# Patient Record
Sex: Female | Born: 1960
Health system: Southern US, Community
[De-identification: ages and names within clinical notes are randomized; demographics above are authoritative.]

## PROBLEM LIST (undated history)

## (undated) DIAGNOSIS — F419 Anxiety disorder, unspecified: Secondary | ICD-10-CM

## (undated) DIAGNOSIS — G4733 Obstructive sleep apnea (adult) (pediatric): Secondary | ICD-10-CM

## (undated) DIAGNOSIS — Z87442 Personal history of urinary calculi: Secondary | ICD-10-CM

## (undated) DIAGNOSIS — R7303 Prediabetes: Secondary | ICD-10-CM

## (undated) DIAGNOSIS — K6289 Other specified diseases of anus and rectum: Secondary | ICD-10-CM

## (undated) DIAGNOSIS — J302 Other seasonal allergic rhinitis: Secondary | ICD-10-CM

## (undated) DIAGNOSIS — G4730 Sleep apnea, unspecified: Secondary | ICD-10-CM

## (undated) DIAGNOSIS — F32A Depression, unspecified: Secondary | ICD-10-CM

## (undated) DIAGNOSIS — T7840XA Allergy, unspecified, initial encounter: Secondary | ICD-10-CM

## (undated) DIAGNOSIS — E7800 Pure hypercholesterolemia, unspecified: Secondary | ICD-10-CM

## (undated) DIAGNOSIS — M1990 Unspecified osteoarthritis, unspecified site: Secondary | ICD-10-CM

## (undated) DIAGNOSIS — I10 Essential (primary) hypertension: Secondary | ICD-10-CM

## (undated) DIAGNOSIS — R031 Nonspecific low blood-pressure reading: Secondary | ICD-10-CM

## (undated) DIAGNOSIS — R112 Nausea with vomiting, unspecified: Secondary | ICD-10-CM

## (undated) DIAGNOSIS — Z9889 Other specified postprocedural states: Secondary | ICD-10-CM

## (undated) DIAGNOSIS — R7611 Nonspecific reaction to tuberculin skin test without active tuberculosis: Secondary | ICD-10-CM

## (undated) DIAGNOSIS — Z86010 Personal history of colonic polyps: Secondary | ICD-10-CM

## (undated) DIAGNOSIS — K5790 Diverticulosis of intestine, part unspecified, without perforation or abscess without bleeding: Secondary | ICD-10-CM

## (undated) DIAGNOSIS — L900 Lichen sclerosus et atrophicus: Secondary | ICD-10-CM

## (undated) DIAGNOSIS — J45909 Unspecified asthma, uncomplicated: Secondary | ICD-10-CM

## (undated) DIAGNOSIS — F329 Major depressive disorder, single episode, unspecified: Secondary | ICD-10-CM

## (undated) DIAGNOSIS — Z9989 Dependence on other enabling machines and devices: Secondary | ICD-10-CM

## (undated) HISTORY — DX: Unspecified osteoarthritis, unspecified site: M19.90

## (undated) HISTORY — DX: Essential (primary) hypertension: I10

## (undated) HISTORY — PX: JOINT REPLACEMENT: SHX530

## (undated) HISTORY — PX: TUBAL LIGATION: SHX77

## (undated) HISTORY — DX: Pure hypercholesterolemia, unspecified: E78.00

## (undated) HISTORY — DX: Unspecified asthma, uncomplicated: J45.909

## (undated) HISTORY — DX: Personal history of colonic polyps: Z86.010

## (undated) HISTORY — PX: ENDOMETRIAL ABLATION: SHX621

## (undated) HISTORY — PX: GANGLION CYST EXCISION: SHX1691

## (undated) HISTORY — DX: Allergy, unspecified, initial encounter: T78.40XA

## (undated) HISTORY — DX: Depression, unspecified: F32.A

## (undated) HISTORY — DX: Sleep apnea, unspecified: G47.30

## (undated) HISTORY — PX: HEMORROIDECTOMY: SUR656

## (undated) HISTORY — PX: OTHER SURGICAL HISTORY: SHX169

## (undated) HISTORY — DX: Major depressive disorder, single episode, unspecified: F32.9

## (undated) HISTORY — DX: Nonspecific reaction to tuberculin skin test without active tuberculosis: R76.11

---

## 1999-10-06 ENCOUNTER — Ambulatory Visit (HOSPITAL_BASED_OUTPATIENT_CLINIC_OR_DEPARTMENT_OTHER): Admission: RE | Admit: 1999-10-06 | Discharge: 1999-10-06 | Payer: Self-pay | Admitting: Orthopedic Surgery

## 2001-06-23 ENCOUNTER — Other Ambulatory Visit: Admission: RE | Admit: 2001-06-23 | Discharge: 2001-06-23 | Payer: Self-pay | Admitting: Obstetrics and Gynecology

## 2002-08-04 ENCOUNTER — Other Ambulatory Visit: Admission: RE | Admit: 2002-08-04 | Discharge: 2002-08-04 | Payer: Self-pay | Admitting: Obstetrics and Gynecology

## 2003-09-09 ENCOUNTER — Other Ambulatory Visit: Admission: RE | Admit: 2003-09-09 | Discharge: 2003-09-09 | Payer: Self-pay | Admitting: Obstetrics and Gynecology

## 2003-11-18 ENCOUNTER — Ambulatory Visit (HOSPITAL_COMMUNITY): Admission: RE | Admit: 2003-11-18 | Discharge: 2003-11-18 | Payer: Self-pay | Admitting: Obstetrics and Gynecology

## 2004-10-11 ENCOUNTER — Other Ambulatory Visit: Admission: RE | Admit: 2004-10-11 | Discharge: 2004-10-11 | Payer: Self-pay | Admitting: Obstetrics and Gynecology

## 2004-12-15 ENCOUNTER — Ambulatory Visit: Payer: Self-pay | Admitting: Family Medicine

## 2005-09-06 ENCOUNTER — Ambulatory Visit: Payer: Self-pay | Admitting: Family Medicine

## 2005-09-26 ENCOUNTER — Emergency Department (HOSPITAL_COMMUNITY): Admission: EM | Admit: 2005-09-26 | Discharge: 2005-09-27 | Payer: Self-pay | Admitting: Emergency Medicine

## 2005-11-12 ENCOUNTER — Other Ambulatory Visit: Admission: RE | Admit: 2005-11-12 | Discharge: 2005-11-12 | Payer: Self-pay | Admitting: Obstetrics and Gynecology

## 2006-10-01 ENCOUNTER — Ambulatory Visit: Payer: Self-pay | Admitting: Internal Medicine

## 2006-11-01 ENCOUNTER — Ambulatory Visit: Payer: Self-pay | Admitting: Family Medicine

## 2006-11-01 LAB — CONVERTED CEMR LAB
ALT: 28 units/L (ref 0–40)
Alkaline Phosphatase: 69 units/L (ref 39–117)
BUN: 7 mg/dL (ref 6–23)
Calcium: 9 mg/dL (ref 8.4–10.5)
Chloride: 105 meq/L (ref 96–112)
GFR calc non Af Amer: 97 mL/min
Potassium: 4.2 meq/L (ref 3.5–5.1)
Total Protein: 7 g/dL (ref 6.0–8.3)

## 2006-11-21 ENCOUNTER — Ambulatory Visit: Payer: Self-pay | Admitting: Family Medicine

## 2007-02-21 ENCOUNTER — Encounter (INDEPENDENT_AMBULATORY_CARE_PROVIDER_SITE_OTHER): Payer: Self-pay | Admitting: Specialist

## 2007-02-21 ENCOUNTER — Ambulatory Visit (HOSPITAL_COMMUNITY): Admission: RE | Admit: 2007-02-21 | Discharge: 2007-02-21 | Payer: Self-pay | Admitting: Obstetrics and Gynecology

## 2007-02-24 ENCOUNTER — Inpatient Hospital Stay (HOSPITAL_COMMUNITY): Admission: AD | Admit: 2007-02-24 | Discharge: 2007-02-25 | Payer: Self-pay | Admitting: Obstetrics and Gynecology

## 2007-05-28 ENCOUNTER — Telehealth (INDEPENDENT_AMBULATORY_CARE_PROVIDER_SITE_OTHER): Payer: Self-pay | Admitting: *Deleted

## 2007-05-29 DIAGNOSIS — F3289 Other specified depressive episodes: Secondary | ICD-10-CM | POA: Insufficient documentation

## 2007-05-29 DIAGNOSIS — F329 Major depressive disorder, single episode, unspecified: Secondary | ICD-10-CM | POA: Insufficient documentation

## 2007-07-10 ENCOUNTER — Ambulatory Visit: Payer: Self-pay | Admitting: Family Medicine

## 2007-07-10 DIAGNOSIS — E785 Hyperlipidemia, unspecified: Secondary | ICD-10-CM | POA: Insufficient documentation

## 2007-07-17 ENCOUNTER — Telehealth (INDEPENDENT_AMBULATORY_CARE_PROVIDER_SITE_OTHER): Payer: Self-pay | Admitting: *Deleted

## 2007-07-31 ENCOUNTER — Ambulatory Visit: Payer: Self-pay | Admitting: Family Medicine

## 2007-08-04 LAB — CONVERTED CEMR LAB
CO2: 30 meq/L (ref 19–32)
Calcium: 8.9 mg/dL (ref 8.4–10.5)
Chloride: 107 meq/L (ref 96–112)
Cholesterol: 151 mg/dL (ref 0–200)
Creatinine, Ser: 0.7 mg/dL (ref 0.4–1.2)
Glucose, Bld: 108 mg/dL — ABNORMAL HIGH (ref 70–99)
HDL: 36.9 mg/dL — ABNORMAL LOW (ref >39.0)

## 2007-10-24 ENCOUNTER — Telehealth (INDEPENDENT_AMBULATORY_CARE_PROVIDER_SITE_OTHER): Payer: Self-pay | Admitting: *Deleted

## 2007-12-16 ENCOUNTER — Telehealth (INDEPENDENT_AMBULATORY_CARE_PROVIDER_SITE_OTHER): Payer: Self-pay | Admitting: *Deleted

## 2008-02-06 ENCOUNTER — Ambulatory Visit: Payer: Self-pay | Admitting: Internal Medicine

## 2008-02-06 DIAGNOSIS — J019 Acute sinusitis, unspecified: Secondary | ICD-10-CM

## 2008-03-05 ENCOUNTER — Ambulatory Visit: Payer: Self-pay | Admitting: Family Medicine

## 2008-03-05 ENCOUNTER — Telehealth (INDEPENDENT_AMBULATORY_CARE_PROVIDER_SITE_OTHER): Payer: Self-pay | Admitting: *Deleted

## 2008-03-11 ENCOUNTER — Telehealth (INDEPENDENT_AMBULATORY_CARE_PROVIDER_SITE_OTHER): Payer: Self-pay | Admitting: *Deleted

## 2008-05-03 ENCOUNTER — Telehealth (INDEPENDENT_AMBULATORY_CARE_PROVIDER_SITE_OTHER): Payer: Self-pay | Admitting: *Deleted

## 2008-05-06 ENCOUNTER — Ambulatory Visit: Payer: Self-pay | Admitting: Internal Medicine

## 2008-06-28 ENCOUNTER — Ambulatory Visit: Payer: Self-pay | Admitting: Family Medicine

## 2008-06-28 DIAGNOSIS — F341 Dysthymic disorder: Secondary | ICD-10-CM

## 2008-07-20 ENCOUNTER — Ambulatory Visit: Payer: Self-pay | Admitting: Family Medicine

## 2008-07-20 DIAGNOSIS — S335XXA Sprain of ligaments of lumbar spine, initial encounter: Secondary | ICD-10-CM

## 2008-08-02 ENCOUNTER — Ambulatory Visit: Payer: Self-pay | Admitting: Family Medicine

## 2008-08-11 ENCOUNTER — Telehealth (INDEPENDENT_AMBULATORY_CARE_PROVIDER_SITE_OTHER): Payer: Self-pay | Admitting: *Deleted

## 2008-08-12 ENCOUNTER — Ambulatory Visit: Payer: Self-pay | Admitting: Family Medicine

## 2008-08-12 DIAGNOSIS — N390 Urinary tract infection, site not specified: Secondary | ICD-10-CM | POA: Insufficient documentation

## 2008-08-12 LAB — CONVERTED CEMR LAB
Bilirubin Urine: NEGATIVE
Bilirubin, Direct: 0.1 mg/dL (ref 0.0–0.3)
Glucose, Urine, Semiquant: NEGATIVE
HDL: 32.8 mg/dL — ABNORMAL LOW (ref >39.0)
Ketones, urine, test strip: NEGATIVE
Total Bilirubin: 0.7 mg/dL (ref 0.3–1.2)
Total CHOL/HDL Ratio: 5
Total Protein: 7.2 g/dL (ref 6.0–8.3)
VLDL: 44 mg/dL — ABNORMAL HIGH (ref 0–40)
pH: 6.5

## 2008-08-15 ENCOUNTER — Emergency Department (HOSPITAL_COMMUNITY): Admission: EM | Admit: 2008-08-15 | Discharge: 2008-08-15 | Payer: Self-pay | Admitting: Emergency Medicine

## 2008-08-15 ENCOUNTER — Telehealth: Payer: Self-pay | Admitting: Internal Medicine

## 2008-08-16 ENCOUNTER — Encounter (INDEPENDENT_AMBULATORY_CARE_PROVIDER_SITE_OTHER): Payer: Self-pay | Admitting: *Deleted

## 2008-08-16 ENCOUNTER — Telehealth (INDEPENDENT_AMBULATORY_CARE_PROVIDER_SITE_OTHER): Payer: Self-pay | Admitting: *Deleted

## 2008-09-23 ENCOUNTER — Encounter (INDEPENDENT_AMBULATORY_CARE_PROVIDER_SITE_OTHER): Payer: Self-pay | Admitting: *Deleted

## 2008-11-05 ENCOUNTER — Telehealth: Payer: Self-pay | Admitting: Family Medicine

## 2008-11-08 ENCOUNTER — Telehealth (INDEPENDENT_AMBULATORY_CARE_PROVIDER_SITE_OTHER): Payer: Self-pay | Admitting: *Deleted

## 2008-11-29 ENCOUNTER — Ambulatory Visit: Payer: Self-pay | Admitting: Family Medicine

## 2009-05-18 ENCOUNTER — Telehealth (INDEPENDENT_AMBULATORY_CARE_PROVIDER_SITE_OTHER): Payer: Self-pay | Admitting: *Deleted

## 2009-07-21 ENCOUNTER — Telehealth: Payer: Self-pay | Admitting: Family Medicine

## 2009-07-21 ENCOUNTER — Emergency Department (HOSPITAL_BASED_OUTPATIENT_CLINIC_OR_DEPARTMENT_OTHER): Admission: EM | Admit: 2009-07-21 | Discharge: 2009-07-21 | Payer: Self-pay | Admitting: Emergency Medicine

## 2009-08-09 ENCOUNTER — Telehealth: Payer: Self-pay | Admitting: Family Medicine

## 2009-08-11 ENCOUNTER — Encounter: Payer: Self-pay | Admitting: Family Medicine

## 2009-08-19 ENCOUNTER — Telehealth (INDEPENDENT_AMBULATORY_CARE_PROVIDER_SITE_OTHER): Payer: Self-pay | Admitting: *Deleted

## 2009-10-19 ENCOUNTER — Ambulatory Visit: Payer: Self-pay | Admitting: Family Medicine

## 2009-10-26 ENCOUNTER — Telehealth (INDEPENDENT_AMBULATORY_CARE_PROVIDER_SITE_OTHER): Payer: Self-pay | Admitting: *Deleted

## 2010-01-04 ENCOUNTER — Telehealth: Payer: Self-pay | Admitting: Family Medicine

## 2010-01-04 ENCOUNTER — Ambulatory Visit: Payer: Self-pay | Admitting: Family Medicine

## 2010-01-25 ENCOUNTER — Telehealth (INDEPENDENT_AMBULATORY_CARE_PROVIDER_SITE_OTHER): Payer: Self-pay | Admitting: *Deleted

## 2010-06-03 ENCOUNTER — Ambulatory Visit: Payer: Self-pay | Admitting: Family Medicine

## 2010-06-03 DIAGNOSIS — J4520 Mild intermittent asthma, uncomplicated: Secondary | ICD-10-CM

## 2010-06-03 DIAGNOSIS — Z87891 Personal history of nicotine dependence: Secondary | ICD-10-CM

## 2010-06-08 ENCOUNTER — Ambulatory Visit: Payer: Self-pay | Admitting: Internal Medicine

## 2010-06-08 DIAGNOSIS — H9209 Otalgia, unspecified ear: Secondary | ICD-10-CM | POA: Insufficient documentation

## 2010-06-08 DIAGNOSIS — M7710 Lateral epicondylitis, unspecified elbow: Secondary | ICD-10-CM | POA: Insufficient documentation

## 2010-06-29 ENCOUNTER — Ambulatory Visit: Payer: Self-pay | Admitting: Family Medicine

## 2010-09-04 ENCOUNTER — Telehealth (INDEPENDENT_AMBULATORY_CARE_PROVIDER_SITE_OTHER): Payer: Self-pay | Admitting: *Deleted

## 2010-09-05 ENCOUNTER — Encounter: Payer: Self-pay | Admitting: Family Medicine

## 2010-09-15 ENCOUNTER — Ambulatory Visit: Payer: Self-pay | Admitting: Family Medicine

## 2010-09-18 LAB — CONVERTED CEMR LAB
AST: 18 units/L (ref 0–37)
BUN: 13 mg/dL (ref 6–23)
Basophils Relative: 0.7 % (ref 0.0–3.0)
CO2: 25 meq/L (ref 19–32)
Calcium: 9.5 mg/dL (ref 8.4–10.5)
Creatinine, Ser: 0.8 mg/dL (ref 0.4–1.2)
Eosinophils Relative: 1.3 % (ref 0.0–5.0)
Glucose, Bld: 107 mg/dL — ABNORMAL HIGH (ref 70–99)
HCT: 40 % (ref 36.0–46.0)
Lymphs Abs: 2.5 10*3/uL (ref 0.7–4.0)
MCV: 88.5 fL (ref 78.0–100.0)
Monocytes Absolute: 0.6 10*3/uL (ref 0.1–1.0)
Platelets: 271 10*3/uL (ref 150.0–400.0)
Total Bilirubin: 0.5 mg/dL (ref 0.3–1.2)
WBC: 7.6 10*3/uL (ref 4.5–10.5)

## 2010-09-22 ENCOUNTER — Encounter: Payer: Self-pay | Admitting: Family Medicine

## 2010-09-22 ENCOUNTER — Ambulatory Visit: Payer: Self-pay | Admitting: Family Medicine

## 2010-09-29 ENCOUNTER — Ambulatory Visit: Payer: Self-pay | Admitting: Family Medicine

## 2010-10-02 LAB — CONVERTED CEMR LAB
LDL Cholesterol: 98 mg/dL (ref 0–99)
Total CHOL/HDL Ratio: 4

## 2010-11-12 ENCOUNTER — Emergency Department (HOSPITAL_COMMUNITY): Admission: EM | Admit: 2010-11-12 | Discharge: 2010-11-12 | Payer: Self-pay | Admitting: Emergency Medicine

## 2010-11-13 ENCOUNTER — Telehealth: Payer: Self-pay | Admitting: Family Medicine

## 2010-11-13 ENCOUNTER — Telehealth (INDEPENDENT_AMBULATORY_CARE_PROVIDER_SITE_OTHER): Payer: Self-pay | Admitting: *Deleted

## 2010-11-16 ENCOUNTER — Telehealth: Payer: Self-pay | Admitting: Family Medicine

## 2010-11-16 ENCOUNTER — Encounter: Payer: Self-pay | Admitting: Family Medicine

## 2010-12-19 ENCOUNTER — Telehealth (INDEPENDENT_AMBULATORY_CARE_PROVIDER_SITE_OTHER): Payer: Self-pay | Admitting: *Deleted

## 2010-12-27 ENCOUNTER — Telehealth: Payer: Self-pay | Admitting: Family Medicine

## 2010-12-27 ENCOUNTER — Encounter (INDEPENDENT_AMBULATORY_CARE_PROVIDER_SITE_OTHER): Payer: Self-pay | Admitting: *Deleted

## 2010-12-27 DIAGNOSIS — K648 Other hemorrhoids: Secondary | ICD-10-CM | POA: Insufficient documentation

## 2010-12-27 DIAGNOSIS — K644 Residual hemorrhoidal skin tags: Secondary | ICD-10-CM | POA: Insufficient documentation

## 2011-01-18 NOTE — Progress Notes (Signed)
Summary: having internal hemorroids//Referral FYI>  Phone Note Call from Patient Call back at [phone]   Caller: Patient Summary of Call: Patient has been having trouble with internal hemorrhoids since last Friday---she has to change her clothes during the day  says she has talked to Dr Laury Axon about internal and external hemorrhoids in the past   since patient has no insurance, does she need to see Dr Laury Axon for a referral??   or can Dr Laury Axon refer her to someone??     or should she go to the ER???        what should she do in the meantime if Dr Laury Axon wants to go the referral route?? Initial call taken by: Jerolyn Shin,  December 27, 2010 12:12 PM  Follow-up for Phone Call        Pls advise..................Marland KitchenFelecia Deloach CMA  December 27, 2010 12:26 PM   Additional Follow-up for Phone Call Additional follow up Details #1::        we can refer Additional Follow-up by: Loreen Freud DO,  December 27, 2010 12:57 PM  New Problems: Hx of HEMORRHOIDS, INTERNAL (ICD-455.0) EXTERNAL HEMORRHOIDS (ICD-455.3)   Additional Follow-up for Phone Call Additional follow up Details #2::    pt aware of the above.....  Renee her schedule is: Free on Monday mornings, Free all day on fridays,  Tues or thursday Free in the  afternoon..... Almeta Monas CMA Duncan Dull)  December 27, 2010 2:03 PM   Additional Follow-up for Phone Call Additional follow up Details #3:: Details for Additional Follow-up Action Taken: PATIENT APPT IS FRIDAY, 02-02-2011 @ 9AM W/DR GESSNER [facility], I WILL INFORM PT.  Magdalen Spatz [facility]  December 27, 2010 2:52 PM   New Problems: Hx of HEMORRHOIDS, INTERNAL (ICD-455.0) EXTERNAL HEMORRHOIDS (ICD-455.3)

## 2011-01-18 NOTE — Progress Notes (Signed)
Summary: CALL-A-NURSE  Phone Note Outgoing Call   Call placed by: Jeremy Johann CMA,  November 13, 2010 9:00 AM Details for Reason: [facility] Triage Call Report Triage Record Num: [id number] Operator: Revonda Humphrey Patient Name: Robin Small Call Date & Time: 11/12/2010 1:29:01PM Patient Phone: [phone] PCP: Lelon Perla Patient Gender: Female PCP Fax : [phone] Patient DOB: [birth year] Practice Name: [facility] Reason for Call: Patient calling about cat bite from own cat while giving bath on Mon 11/06/10. Full puncture wound on forarm, cleaned with peroxide and applied neosporin. Now entire arm aching, rash over arm, looks infected. Will be seen now. Protocol(s) Used: Bites - Animal or Human Recommended Outcome per Protocol: See ED Immediately Reason for Outcome: Any full thickness puncture wound (passes through the skin layers of epidermis and dermis and penetrates into the underlying subcutaneous tissue) Care Advice:  ~ 11 Summary of Call: PT states that she went ED on yesterday and was rx several meds. Pt advise if wound does not improve or fever, swelling or any changes in area occur pt needs to be seen. pt verbalized understanding....................Marland KitchenFelecia Deloach CMA  November 13, 2010 9:00 AM     CALL

## 2011-01-18 NOTE — Letter (Signed)
Summary: Primary Care Appointment Letter  [facility]  [address] [place], [place] [address]   Phone: [phone]  Fax: [phone]    09/05/2010 MRN: [id number]  SMRITHI PIGFORD [address] [place], [place]  [address]  Dear Ms. Radney,   Your Primary Care Physician Loreen Freud DO has indicated that:    ___X____it is time to schedule an appointment.    _______you missed your appointment on______ and need to call and          reschedule.    _______you need to have lab work done.    _______you need to schedule an appointment discuss lab or test results.    _______you need to call to reschedule your appointment that is                       scheduled on _________.     Please call our office as soon as possible. Our phone number is [phone]. Please press option 1. Our office is open 8a-5p, Monday through Friday.     Thank you,    [facility]

## 2011-01-18 NOTE — Progress Notes (Signed)
Summary: Samples   Phone Note Call from Patient Call back at [phone]   Caller: Patient Summary of Call: Pt states that she may not be able to afford her RX for Vytorin. Wants to know if there are any samples that she can have. Initial call taken by: Lavell Islam,  September 04, 2010 9:31 AM  Follow-up for Phone Call        No samples available that matches the dose of 10/80, tried calling pt. Left message to call back  also due for labs last done 08/02/08  Follow-up by: Almeta Monas CMA Duncan Dull),  September 04, 2010 11:52 AM  Additional Follow-up for Phone Call Additional follow up Details #1::        left pt detail message no samples available in that strength and she is due for labs. pt to call to schedule appt. letter mailed .................Marland KitchenFelecia Deloach CMA  September 05, 2010 11:34 AM

## 2011-01-18 NOTE — Assessment & Plan Note (Signed)
Summary: earache/cbs   Vital Signs:  Patient profile:   50 year old female Weight:      182.6 pounds Temp:     98.1 degrees F oral Pulse rate:   76 / minute Resp:     17 per minute BP sitting:   122 / 84  (left arm) Cuff size:   large  Vitals Entered By: Shonna Chock (June 08, 2010 3:50 PM) CC: 1.) Right ear ache  2.) Patient needs refill on Cymbalta and Vytorin Comments REVIEWED MED LIST, PATIENT AGREED DOSE AND INSTRUCTION CORRECT    Primary Care Provider:  Laury Axon  CC:  1.) Right ear ache  2.) Patient needs refill on Cymbalta and Vytorin.  History of Present Illness: Onset as earache 1 week ago followed by head congestion. She had green nasal discharge from head 06/18. Congestion improved with Claritin & inhaler but not the earache. Throbbing paing from R jaw to R posterior auricular area.  Allergies: 1)  ! Codeine  Review of Systems General:  Denies chills, fever, and sweats. Eyes:  Denies discharge, eye pain, red eye, and vision loss-both eyes. ENT:  Complains of ringing in ears; denies decreased hearing and ear discharge; No frontal headache, facial pain. Resp:  Denies cough, shortness of breath, sputum productive, and wheezing. MS:  Complains of joint pain and joint swelling; Pain  R elbow X 1 month.  Physical Exam  General:  in no acute distress; alert,appropriate and cooperative throughout examination Eyes:  No corneal or conjunctival inflammation noted. EOMI. Perrla. Vision grossly normal. Ears:  External ear exam shows no significant lesions or deformities.  Otoscopic examination reveals clear canals, tympanic membranes are intact bilaterally without bulging, retraction, inflammation or discharge. Hearing is grossly normal bilaterally. Nose:  External nasal examination shows no deformity or inflammation. Nasal mucosa are  dry  without lesions or exudates. Mouth:  Oral mucosa and oropharynx without lesions or exudates.  Teeth in good repair. Lungs:  Normal  respiratory effort, chest expands symmetrically. Lungs are clear to auscultation, no crackles or wheezes. Extremities:  Classic tennis elbow on R Skin:  Intact without suspicious lesions or rashes Cervical Nodes:  No lymphadenopathy noted but tender R infra mandibular area Axillary Nodes:  No palpable lymphadenopathy   Impression & Recommendations:  Problem # 1:  EAR PAIN, RIGHT (ICD-388.70)  The following medications were removed from the medication list:    Metronidazole 250 Mg Tabs (Metronidazole) .Marland Kitchen... Take  three times a day Her updated medication list for this problem includes:    Amoxicillin-pot Clavulanate 875-125 Mg Tabs (Amoxicillin-pot clavulanate) .Marland Kitchen... 1 every 12 hrs with a meal  Problem # 2:  LATERAL EPICONDYLITIS, RIGHT (ICD-726.32)  Complete Medication List: 1)  Vytorin 10-80 Mg Tabs (Ezetimibe-simvastatin) .Marland Kitchen.. 1 by mouth qd 2)  Clonazepam 0.5 Mg Tabs (Clonazepam) .... Take one tablet at bedtime as needed 3)  Nasonex 50 Mcg/act Susp (Mometasone furoate) .... 2 sprays each nostril once dailyout 4)  Cymbalta 30 Mg Cpep (Duloxetine hcl) .Marland Kitchen.. 1 by mouth once daily 5)  Amoxicillin-pot Clavulanate 875-125 Mg Tabs (Amoxicillin-pot clavulanate) .Marland Kitchen.. 1 every 12 hrs with a meal 6)  Celebrex 200 Mg Caps (Celecoxib) .Marland Kitchen.. 1 two times a day for pain as needed  Patient Instructions: 1)  Exercises two times a day for tennis elbow.Apply Zostrix cream two times a day Glucosamine sulfate 1500 mg once daily until elbow pain resolves.  Prescriptions: CELEBREX 200 MG CAPS (CELECOXIB) 1 two times a day for pain as needed  #12  x 0   Entered and Authorized by:   Marga Melnick MD   Signed by:   Marga Melnick MD on 06/08/2010   Method used:   Historical   RxID:   [id number] AMOXICILLIN-POT CLAVULANATE 875-125 MG TABS (AMOXICILLIN-POT CLAVULANATE) 1 every 12 hrs with a meal  #20 x 0   Entered and Authorized by:   Marga Melnick MD   Signed by:   Marga Melnick MD on 06/08/2010    Method used:   Faxed to ...       [facility] [address] [id number] (retail)       [address]       [place], [place]  [address]       Ph: [phone]       Fax: [phone]   RxID:   [id number]

## 2011-01-18 NOTE — Progress Notes (Signed)
Summary: cymbalta  Phone Note Outgoing Call   Summary of Call: Pts Cymbalta came in- place them up front for pt to pick up. Pt is aware. Army Fossa CMA  January 25, 2010 11:48 AM

## 2011-01-18 NOTE — Progress Notes (Signed)
Summary: Refills  Phone Note Refill Request Call back at Home Phone [phone] Message from:  Patient  Refills Requested: Medication #1:  SYMBICORT 160-4.5 MCG/ACT AERO 2 puffs bid [facility]...........................Marland KitchenFelecia Deloach CMA  November 13, 2010 9:01 AM      Prescriptions: SYMBICORT 160-4.5 MCG/ACT AERO (BUDESONIDE-FORMOTEROL FUMARATE) 2 puffs bid  #1 x 3   Entered by:   Almeta Monas CMA (AAMA)   Authorized by:   Loreen Freud DO   Signed by:   Almeta Monas CMA (AAMA) on 11/13/2010   Method used:   Electronically to        [facility] [address] (retail)       [phone]       [place], [place]  [address]       Ph: [phone]       Fax: [phone]   RxID:   [id number]

## 2011-01-18 NOTE — Medication Information (Signed)
Summary: Patient Assistance Form/[employer]  Patient Assistance Form/[employer]   Imported By: Lanelle Bal 01/11/2010 09:57:51  _____________________________________________________________________  External Attachment:    Type:   Image     Comment:   External Document

## 2011-01-18 NOTE — Medication Information (Signed)
Summary: Patient Assistance Form/[employer]  Patient Assistance Form/[employer]   Imported By: Lanelle Bal 12/25/2010 14:19:22  _____________________________________________________________________  External Attachment:    Type:   Image     Comment:   External Document

## 2011-01-18 NOTE — Progress Notes (Signed)
Summary: Vytorin samples, symbicort or albuterol sample  Phone Note Call from Patient Call back at [phone]   Caller: Patient Summary of Call: Patient is almost out of Vytorin and asks if we have samples (paperwork for assistance  for Vytorin was in bin at front office--will be mailed on 12/20/2010), copy will be mailed to patient and copy will be sent to be scanned----patient says she has been taking the Vytorin 10/40 samples that she was given at first of December  patient says Symbicort costs $220.00 without insurance---asks if she can have sample of symbicort or Albuterol--says she used symbicort on a daily basis and used Albuterol as needed  She is out of school this week, so this is a good week to come and pick up samples--please advise her about different strengths of Vytorin samples Initial call taken by: Jerolyn Shin,  December 19, 2010 2:47 PM  Follow-up for Phone Call        pls advise............Marland KitchenFelecia Deloach CMA  December 19, 2010 4:30 PM   Additional Follow-up for Phone Call Additional follow up Details #1::        ok to give samples if we have them Additional Follow-up by: Loreen Freud DO,  December 19, 2010 4:33 PM    Additional Follow-up for Phone Call Additional follow up Details #2::    Left message to call office, samples placed up front for pick up............Marland KitchenFelecia Deloach CMA  December 19, 2010 4:40 PM   left message to call office.........Marland KitchenFelecia Deloach CMA  December 20, 2010 8:51 AM   Patient is aware samples are avaible for pick up.Harold Barban  December 20, 2010 1:16 PM

## 2011-01-18 NOTE — Progress Notes (Signed)
  Phone Note Call from Patient   Caller: Patient Call For: Loreen Freud DO Reason for Call: Refill Medication Summary of Call: Pt need rx for pt assistance for vytorin Initial call taken by: Loreen Freud DO,  November 16, 2010 2:52 PM    Prescriptions: VYTORIN 10-80 MG TABS (EZETIMIBE-SIMVASTATIN) 1 by mouth qd  #90 x 3   Entered and Authorized by:   Loreen Freud DO   Signed by:   Loreen Freud DO on 11/16/2010   Method used:   Print then Give to Patient   RxID:   [id number]

## 2011-01-18 NOTE — Letter (Signed)
Summary: New Patient letter  [facility]  [address] [place], [place] [address]   Phone: [phone]  Fax: [phone]       12/27/2010 MRN: [id number]  Robin Small [address] [place], [place]  [address]  Dear Robin Small,  Welcome to the Gastroenterology Division at [facility].    You are scheduled to see Dr.  Stan Head on February 02, 2011 at 9:00am on the 3rd floor at [facility], [address]  We ask that you try to arrive at our office 15 minutes prior to your appointment time to allow for check-in.  We would like you to complete the enclosed self-administered evaluation form prior to your visit and bring it with you on the day of your appointment.  We will review it with you.  Also, please bring a complete list of all your medications or, if you prefer, bring the medication bottles and we will list them.  Please bring your insurance card so that we may make a copy of it.  If your insurance requires a referral to see a specialist, please bring your referral form from your primary care physician.  Co-payments are due at the time of your visit and may be paid by cash, check or credit card.     Your office visit will consist of a consult with your physician (includes a physical exam), any laboratory testing he/she may order, scheduling of any necessary diagnostic testing (e.g. x-ray, ultrasound, CT-scan), and scheduling of a procedure (e.g. Endoscopy, Colonoscopy) if required.  Please allow enough time on your schedule to allow for any/all of these possibilities.    If you cannot keep your appointment, please call [phone] to cancel or reschedule prior to your appointment date.  This allows [place] the opportunity to schedule an appointment for another patient in need of care.  If you do not cancel or reschedule by 5 p.m. the business day prior to your appointment date, you will be charged a $50.00 late cancellation/no-show fee.    Thank you  for choosing [facility] for your medical needs.  We appreciate the opportunity to care for you.  Please visit [place] at our website  to learn more about our practice.                     Sincerely,                                                             The Gastroenterology Division

## 2011-01-18 NOTE — Assessment & Plan Note (Signed)
Summary: CPX/LABS PRIOR//KN   Vital Signs:  Patient profile:   50 year old female Height:      60.75 inches Weight:      184 pounds BMI:     35.18 O2 Sat:      96 % on Room air Pulse rate:   95 / minute BP sitting:   112 / 80  (left arm)  Vitals Entered By: Doristine Devoid CMA (September 22, 2010 2:13 PM)  O2 Flow:  Room air CC:  CPX/ no pap needed Comments discuss cymbalta she has stopped about 1 month ago and also would like something in place of vytorin that is generic    History of Present Illness: Pt here for cpe,  no pap. Pt has gyn.  Pt is not sleeping and snores but she has no ins for a sleep study.     Preventive Screening-Counseling & Management  Alcohol-Tobacco     Alcohol drinks/day: <1     Smoking Status: current     Packs/Day: 1/4  Caffeine-Diet-Exercise     Caffeine use/day: 5     Does Patient Exercise: yes     Type of exercise: hoola hooping     Exercise (avg: min/session): <30     Times/week: <3  Hep-HIV-STD-Contraception     Dental Visit-last 6 months yes     Dental Care Counseling: not indicated; dental care within six months     SBE monthly: yes      Sexual History:  currently monogamous.    Current Medications (verified): 1)  Vytorin 10-80 Mg Tabs (Ezetimibe-Simvastatin) .Marland Kitchen.. 1 By Mouth Qd 2)  Clonazepam 0.5 Mg Tabs (Clonazepam) .Marland Kitchen.. 1 By Mouth Two Times A Day As Needed 3)  Nasonex 50 Mcg/act  Susp (Mometasone Furoate) .... 2 Sprays Each Nostril Once Dailyout 4)  Celebrex 200 Mg Caps (Celecoxib) .Marland Kitchen.. 1 Two Times A Day For Pain As Needed 5)  Claritin 10 Mg Tabs (Loratadine) .Marland Kitchen.. 1 By Mouth Once Daily 6)  Symbicort 160-4.5 Mcg/act Aero (Budesonide-Formoterol Fumarate) .... 2 Puffs Bid 7)  Proair Hfa 108 (90 Base) Mcg/act Aers (Albuterol Sulfate) .... 2 Puffs Qid As Needed 8)  Celexa 20 Mg Tabs (Citalopram Hydrobromide) .Marland Kitchen.. 1 By Mouth Once Daily 9)  Ceftin 500 Mg Tabs (Cefuroxime Axetil) .Marland Kitchen.. 1 By Mouth Two Times A Day  Allergies (verified): 1)   ! Codeine  Past History:  Past Medical History: Last updated: 05/06/2008  High Cholesterol Depression  Family History: Last updated: 05/29/2007 Family History Diabetes 1st degree relative Family History Hypertension Family History Other cancer-Breast  Social History: Last updated: 06/03/2010 Married Current Smoker  Risk Factors: Alcohol Use: <1 (09/22/2010) Caffeine Use: 5 (09/22/2010) Exercise: yes (09/22/2010)  Risk Factors: Smoking Status: current (09/22/2010) Packs/Day: 1/4 (09/22/2010)  Past Surgical History: arthroscopic L knee  2010  Family History: Reviewed history from 05/29/2007 and no changes required. Family History Diabetes 1st degree relative Family History Hypertension Family History Other cancer-Breast  Social History: Reviewed history from 06/03/2010 and no changes required. Married Current Smoker Caffeine use/day:  5 Does Patient Exercise:  yes Dental Care w/in 6 mos.:  yes Sexual History:  currently monogamous  Review of Systems      See HPI General:  Denies chills, fatigue, fever, loss of appetite, malaise, sleep disorder, sweats, weakness, and weight loss. Eyes:  Denies blurring, discharge, double vision, eye irritation, eye pain, halos, itching, light sensitivity, red eye, vision loss-1 eye, and vision loss-both eyes; + glasses. ENT:  Complains of nasal  congestion; denies decreased hearing, difficulty swallowing, ear discharge, earache, hoarseness, nosebleeds, postnasal drainage, ringing in ears, sinus pressure, and sore throat. CV:  Denies bluish discoloration of lips or nails, chest pain or discomfort, difficulty breathing at night, difficulty breathing while lying down, fainting, fatigue, leg cramps with exertion, lightheadness, near fainting, palpitations, shortness of breath with exertion, swelling of feet, swelling of hands, and weight gain. Resp:  Denies chest discomfort, chest pain with inspiration, cough, coughing up blood,  excessive snoring, hypersomnolence, morning headaches, pleuritic, shortness of breath, sputum productive, and wheezing. GI:  Denies abdominal pain, bloody stools, change in bowel habits, constipation, dark tarry stools, diarrhea, excessive appetite, gas, hemorrhoids, indigestion, loss of appetite, nausea, vomiting, vomiting blood, and yellowish skin color. GU:  Denies abnormal vaginal bleeding, decreased libido, discharge, dysuria, genital sores, hematuria, incontinence, nocturia, urinary frequency, and urinary hesitancy. MS:  Denies joint pain, joint redness, joint swelling, loss of strength, low back pain, mid back pain, muscle aches, muscle , cramps, muscle weakness, stiffness, and thoracic pain. Derm:  Denies changes in color of skin, changes in nail beds, dryness, excessive perspiration, flushing, hair loss, insect bite(s), itching, lesion(s), poor wound healing, and rash. Neuro:  Denies brief paralysis, difficulty with concentration, disturbances in coordination, falling down, headaches, inability to speak, memory loss, numbness, poor balance, seizures, sensation of room spinning, tingling, tremors, visual disturbances, and weakness. Psych:  Denies alternate hallucination ( auditory/visual), anxiety, depression, easily angered, easily tearful, irritability, mental problems, panic attacks, sense of great danger, suicidal thoughts/plans, thoughts of violence, unusual visions or sounds, and thoughts /plans of harming others. Endo:  Denies cold intolerance, excessive hunger, excessive thirst, excessive urination, heat intolerance, polyuria, and weight change. Heme:  Denies abnormal bruising, bleeding, enlarge lymph nodes, fevers, pallor, and skin discoloration. Allergy:  Denies hives or rash, itching eyes, persistent infections, seasonal allergies, and sneezing.  Physical Exam  General:  Well-developed,well-nourished,in no acute distress; alert,appropriate and cooperative throughout examination Head:   Normocephalic and atraumatic without obvious abnormalities. No apparent alopecia or balding. Eyes:  vision grossly intact, pupils equal, pupils round, pupils reactive to light, and no injection.   Ears:  External ear exam shows no significant lesions or deformities.  Otoscopic examination reveals clear canals, tympanic membranes are intact bilaterally without bulging, retraction, inflammation or discharge. Hearing is grossly normal bilaterally. Nose:  External nasal examination shows no deformity or inflammation. Nasal mucosa are pink and moist without lesions or exudates. Mouth:  Oral mucosa and oropharynx without lesions or exudates.  Teeth in good repair. Neck:  No deformities, masses, or tenderness noted. Breasts:  gyn Lungs:  R wheezes and L wheezes.   Heart:  normal rate and no murmur.   Abdomen:  Bowel sounds positive,abdomen soft and non-tender without masses, organomegaly or hernias noted. Rectal:  gyn Genitalia:  gyn Msk:  normal ROM, no joint tenderness, no joint swelling, no joint warmth, no redness over joints, no joint deformities, no joint instability, and no crepitation.   Pulses:  R posterior tibial normal, R dorsalis pedis normal, R carotid normal, L posterior tibial normal, L dorsalis pedis normal, and L carotid normal.   Extremities:  No clubbing, cyanosis, edema, or deformity noted with normal full range of motion of all joints.   Neurologic:  No cranial nerve deficits noted. Station and gait are normal. Plantar reflexes are down-going bilaterally. DTRs are symmetrical throughout. Sensory, motor and coordinative functions appear intact. Skin:  Intact without suspicious lesions or rashes Cervical Nodes:  No lymphadenopathy noted Psych:  Cognition and judgment appear intact. Alert and cooperative with normal attention span and concentration. No apparent delusions, illusions, hallucinations   Impression & Recommendations:  Problem # 1:  PREVENTIVE HEALTH CARE  (ICD-V70.0)  labs reviewed but lipid not done--- pt will return to get it done  Orders: EKG w/ Interpretation (93000)  Problem # 2:  ASTHMA (ICD-493.90)  Her updated medication list for this problem includes:    Symbicort 160-4.5 Mcg/act Aero (Budesonide-formoterol fumarate) .Marland Kitchen... 2 puffs bid    Proair Hfa 108 (90 Base) Mcg/act Aers (Albuterol sulfate) .Marland Kitchen... 2 puffs qid as needed  Pulmonary Functions Reviewed: O2 sat: 96 (09/22/2010)  Orders: EKG w/ Interpretation (93000)  Problem # 3:  DEPRESSION (ICD-311)  The following medications were removed from the medication list:    Cymbalta 30 Mg Cpep (Duloxetine hcl) .Marland Kitchen... 1 by mouth once daily Her updated medication list for this problem includes:    Clonazepam 0.5 Mg Tabs (Clonazepam) .Marland Kitchen... 1 by mouth two times a day as needed    Celexa 20 Mg Tabs (Citalopram hydrobromide) .Marland Kitchen... 1 by mouth once daily  Orders: EKG w/ Interpretation (93000)  Problem # 4:  TOBACCO USE (ICD-305.1) Assessment: Unchanged  Encouraged smoking cessation and discussed different methods for smoking cessation.   Orders: EKG w/ Interpretation (93000)  Problem # 5:  HYPERLIPIDEMIA (ICD-272.4)  Her updated medication list for this problem includes:    Vytorin 10-80 Mg Tabs (Ezetimibe-simvastatin) .Marland Kitchen... 1 by mouth qd  Orders: T- * Misc. Laboratory test [phone]) EKG w/ Interpretation (93000)  Labs Reviewed: SGOT: 18 (09/15/2010)   SGPT: 25 (09/15/2010)   HDL:32.8 (08/02/2008), 36.9 (07/31/2007)  LDL:DEL (08/02/2008), 84 (60/45/4098)  Chol:164 (08/02/2008), 151 (07/31/2007)  Trig:220 (08/02/2008), 149 (07/31/2007)  Complete Medication List: 1)  Vytorin 10-80 Mg Tabs (Ezetimibe-simvastatin) .Marland Kitchen.. 1 by mouth qd 2)  Clonazepam 0.5 Mg Tabs (Clonazepam) .Marland Kitchen.. 1 by mouth two times a day as needed 3)  Nasonex 50 Mcg/act Susp (Mometasone furoate) .... 2 sprays each nostril once dailyout 4)  Celebrex 200 Mg Caps (Celecoxib) .Marland Kitchen.. 1 two times a day for pain as  needed 5)  Claritin 10 Mg Tabs (Loratadine) .Marland Kitchen.. 1 by mouth once daily 6)  Symbicort 160-4.5 Mcg/act Aero (Budesonide-formoterol fumarate) .... 2 puffs bid 7)  Proair Hfa 108 (90 Base) Mcg/act Aers (Albuterol sulfate) .... 2 puffs qid as needed 8)  Celexa 20 Mg Tabs (Citalopram hydrobromide) .Marland Kitchen.. 1 by mouth once daily 9)  Ceftin 500 Mg Tabs (Cefuroxime axetil) .Marland Kitchen.. 1 by mouth two times a day  Patient Instructions: 1)  take 1/2 celexa daily for 8 days then increase to 1 by mouth once daily  2)  rto for lipid panal 272.4-- no charge because it was ordered on 9/30 but not sent 3)  Please schedule a follow-up appointment in 1 month.  Prescriptions: CEFTIN 500 MG TABS (CEFUROXIME AXETIL) 1 by mouth two times a day  #20 x 0   Entered and Authorized by:   Loreen Freud DO   Signed by:   Loreen Freud DO on 09/22/2010   Method used:   Electronically to        [facility] [address] (retail)       [phone]       [place], [place]  [address]       Ph: [phone]       Fax: [phone]   RxID:   [id number] CELEXA 20 MG TABS (CITALOPRAM HYDROBROMIDE) 1  by mouth once daily  #30 x 2   Entered and Authorized by:   Loreen Freud DO   Signed by:   Loreen Freud DO on 09/22/2010   Method used:   Electronically to        [facility] [address] (retail)       [phone]       [place], [place]  [address]       Ph: [phone]       Fax: [phone]   RxID:   [id number] SYMBICORT 160-4.5 MCG/ACT AERO (BUDESONIDE-FORMOTEROL FUMARATE) 2 puffs bid  #1 x 0   Entered and Authorized by:   Loreen Freud DO   Signed by:   Loreen Freud DO on 09/22/2010   Method used:   Historical   RxID:   [id number] CLONAZEPAM 0.5 MG TABS (CLONAZEPAM) 1 by mouth two times a day as needed  #60 x 1   Entered and Authorized by:   Loreen Freud DO   Signed by:   Loreen Freud DO on 09/22/2010   Method used:   Print then Give to Patient    RxID:   [id number]   Last Flu Vaccine:  [facility] (11/20/2008 11:26:00 AM) Flu Vaccine Next Due:  Refused PAP Result Date:  10/04/2010 PAP Result:  normal PAP Next Due:  1 yr Mammogram Result Date:  10/04/2010 Mammogram Result:  normal Mammogram Next Due:  1 yr

## 2011-01-18 NOTE — Progress Notes (Signed)
Summary: "[employer]" FORM TO COMPLETE  Phone Note Call from Patient Call back at [phone]   Caller: Patient Summary of Call: PATIENT BROUGHT IN FORM FROM "[employer]"  ASSISTANCE PROGRAM FOR HER CYMBALTA FOR DR LOWNE TO COMPLETE---  SHE INCLUDED A STAMPED ENVELOPE ; PLEASE CALL HER TO TELL HER THAT PAPERWORK HAS BEEN MAILED  WILL TAKE TO [facility] Initial call taken by: Jerolyn Shin,  January 04, 2010 8:11 AM  Follow-up for Phone Call        placed on ledge awaiting dr Laury Axon signature............Marland KitchenFelecia Deloach CMA  January 04, 2010 8:17 AM   Additional Follow-up for Phone Call Additional follow up Details #1::        done Additional Follow-up by: Loreen Freud DO,  January 04, 2010 8:33 AM    Additional Follow-up for Phone Call Additional follow up Details #2::    left pt detail message forms complete and placed in mail.............Marland KitchenFelecia Deloach CMA  January 04, 2010 8:45 AM

## 2011-01-18 NOTE — Assessment & Plan Note (Signed)
Summary: green congestion/vfw   Vital Signs:  Patient profile:   50 year old female Weight:      184 pounds O2 Sat:      94 % on Room air Temp:     98.0 degrees F oral Pulse rate:   76 / minute BP sitting:   130 / 80  (left arm)  Vitals Entered By: Doristine Devoid (June 03, 2010 10:23 AM)  O2 Flow:  Room air CC: sinus congestion and cough x2 weeks getting worse more productive   Primary Care Provider:  Laury Axon  CC:  sinus congestion and cough x2 weeks getting worse more productive.  History of Present Illness: 50 y/o fem smoker w 2 weeks h/o of st, cong, cough.  no fever.  stopped symbicort and chantix b/c no ins..but cont to smoke   pt. became argumentive wants a atb..........says she need a atb.............Marland Kitchenexpl. atb not indicated for ar,asthma and smoking !!!!!!!!!!!!   Preventive Screening-Counseling & Management  Alcohol-Tobacco     Smoking Status: current  Allergies: 1)  ! Codeine  Social History: Reviewed history from 05/06/2008 and no changes required. Married Current Smoker  Review of Systems      See HPI  Physical Exam  General:  Well-developed,well-nourished,in no acute distress; alert,appropriate and cooperative throughout examination...smells  of tab Head:  Normocephalic and atraumatic without obvious abnormalities. No apparent alopecia or balding. Eyes:  No corneal or conjunctival inflammation noted. EOMI. Perrla. Funduscopic exam benign, without hemorrhages, exudates or papilledema. Vision grossly normal. Ears:  External ear exam shows no significant lesions or deformities.  Otoscopic examination reveals clear canals, tympanic membranes are intact bilaterally without bulging, retraction, inflammation or discharge. Hearing is grossly normal bilaterally. Nose:  External nasal examination shows no deformity or inflammation. Nasal mucosa are pink and moist without lesions or exudates. Mouth:  Oral mucosa and oropharynx without lesions or exudates.  Teeth in  good repair. Neck:  No deformities, masses, or tenderness noted. Chest Wall:  No deformities, masses, or tenderness noted. Lungs:  dec. bs bot sym.late exp wheezing   Problems:  Medical Problems Added: 1)  Dx of Asthma  (ICD-493.90) 2)  Dx of Tobacco Use  (ICD-305.1) 3)  Dx of Diarrhea  (ICD-787.91)  Impression & Recommendations:  Problem # 1:  ASTHMA (ICD-493.90) Assessment Deteriorated  Her updated medication list for this problem includes:    Symbicort 160-4.5 Mcg/act Aero (Budesonide-formoterol fumarate) .Marland Kitchen... 2 puffs two times a day as needed  Orders: Tobacco use cessation intermediate 3-10 minutes ([address])  Problem # 2:  TOBACCO USE (ICD-305.1) Assessment: Unchanged  Her updated medication list for this problem includes:    Chantix Starting Month Pak 0.5 Mg X 11 & 1 Mg X 42 Tabs (Varenicline tartrate) ..... Use as directed-- office visit in 1 month  Orders: Tobacco use cessation intermediate 3-10 minutes ([address])  Complete Medication List: 1)  Vytorin 10-80 Mg Tabs (Ezetimibe-simvastatin) .Marland Kitchen.. 1 by mouth qd 2)  Clonazepam 0.5 Mg Tabs (Clonazepam) .... Take one tablet at bedtime as needed 3)  Nasonex 50 Mcg/act Susp (Mometasone furoate) .... 2 sprays each nostril once dailyout 4)  Symbicort 160-4.5 Mcg/act Aero (Budesonide-formoterol fumarate) .... 2 puffs two times a day as needed 5)  Cymbalta 30 Mg Cpep (Duloxetine hcl) .Marland Kitchen.. 1 by mouth once daily 6)  Chantix Starting Month Pak 0.5 Mg X 11 & 1 Mg X 42 Tabs (Varenicline tartrate) .... Use as directed-- office visit in 1 month 7)  Anucort-hc 25 Mg Supp (Hydrocortisone acetate) .Marland KitchenMarland KitchenMarland Kitchen  1 pr 2-3 times a day 8)  Metronidazole 250 Mg Tabs (Metronidazole) .... Take  three times a day  Patient Instructions: 1)  drink 30 oz of h2o daily 2)  no smoking 3)  qvar 2 ps two times a day  4)  see Dr. on mon for followup

## 2011-02-02 ENCOUNTER — Telehealth (INDEPENDENT_AMBULATORY_CARE_PROVIDER_SITE_OTHER): Payer: Self-pay | Admitting: *Deleted

## 2011-02-02 ENCOUNTER — Ambulatory Visit: Payer: Self-pay | Admitting: Internal Medicine

## 2011-02-05 ENCOUNTER — Encounter (INDEPENDENT_AMBULATORY_CARE_PROVIDER_SITE_OTHER): Payer: Self-pay | Admitting: *Deleted

## 2011-02-13 NOTE — Letter (Signed)
Summary: Generic Letter  [facility] [place]  [address] [place], [place] [address]   Phone: [phone]  Fax: [phone]      02/05/2011   Loreen Freud DO [phone] [address] [place] [address]   Re:  Robin Small DOB: 04-05-1961   To whom it may concern:   The above named patient has been seen by our office and is currently suffers from Hyperlipidemia. She is being monitored and treated by our office for this diagnosis as well. If there are any questions or concerns feel free to contact us at [phone].          Sincerely,          Loreen Freud DO

## 2011-02-13 NOTE — Progress Notes (Signed)
Summary: needs letter from doctor  Phone Note Call from Patient Call back at cell = [phone]   Caller: Patient Summary of Call: Patient needs a letter from Dr Laury Axon  confirming her  high risk diagnosis for high cholesterol---this will allow her to qualify for Federal program for insurance called "[employer]"  please call Luca on her cell at [phone] when letter is ready Initial call taken by: Jerolyn Shin,  February 02, 2011 9:07 AM  Follow-up for Phone Call        Pls advise.........Marland KitchenFelecia Deloach CMA  February 02, 2011 9:27 AM   Additional Follow-up for Phone Call Additional follow up Details #1::        ok to give note she has hyperlipidemia Additional Follow-up by: Loreen Freud DO,  February 02, 2011 11:56 AM    Additional Follow-up for Phone Call Additional follow up Details #2::    mssg left advising pt letter will be ready on Monday... Almeta Monas CMA Duncan Dull)  February 02, 2011 5:05 PM   Additional Follow-up for Phone Call Additional follow up Details #3:: Details for Additional Follow-up Action Taken: VM left advising patient letter is ready for pick up..... Almeta Monas CMA (AAMA)  February 05, 2011 1:24 PM

## 2011-02-21 ENCOUNTER — Ambulatory Visit: Payer: Self-pay | Admitting: Internal Medicine

## 2011-02-26 ENCOUNTER — Telehealth: Payer: Self-pay | Admitting: Internal Medicine

## 2011-02-26 ENCOUNTER — Ambulatory Visit: Payer: Self-pay | Admitting: Internal Medicine

## 2011-02-26 ENCOUNTER — Ambulatory Visit (INDEPENDENT_AMBULATORY_CARE_PROVIDER_SITE_OTHER): Payer: Self-pay | Admitting: Internal Medicine

## 2011-02-26 ENCOUNTER — Encounter: Payer: Self-pay | Admitting: Internal Medicine

## 2011-02-26 DIAGNOSIS — K921 Melena: Secondary | ICD-10-CM

## 2011-03-06 ENCOUNTER — Ambulatory Visit: Payer: Self-pay | Admitting: Internal Medicine

## 2011-03-06 NOTE — Progress Notes (Signed)
Summary: Appt "switch"  Phone Note Call from Patient   Caller: Patient Call For: Dr. Leone Payor Reason for Call: Talk to Nurse Summary of Call: Patients husband was scheduled for an appt today 02/26/11 @ 10:30 but he had a meeting and was unable to come to since the wife was scheduled for 03/06/2011 she came for his appt and wants to "switch" appts with husband Initial call taken by: [place] Johnson,  February 26, 2011 10:13 AM  Follow-up for Phone Call        done Follow-up by: Iva Boop MD, Clementeen Graham,  February 26, 2011 2:16 PM

## 2011-03-06 NOTE — Assessment & Plan Note (Signed)
Summary: HEMORROIDS.Marland KitchenEM  SCHED WITH RENEE SELF PAY $184 ADVISED  MAILE...   History of Present Illness Visit Type: new patient  Primary GI MD: Stan Head MD [facility] Primary Provider: Loreen Freud, DO  Requesting Provider: na Chief Complaint: hemorrhoids  History of Present Illness:   50 yo ww with rectal bleeding problems intermittently in the past but near the end of the year she had 1 week of passing blood which is unusual for her. Typically much shorther duration. Blood bright blood on toilet paper, in stool and in bowel movement. there was some associated rectal pain. 20 years ago she had hemorrhoid problems and had a thrombosed hemorrhoid treated in an ER.   GI Review of Systems      Denies abdominal pain, acid reflux, belching, bloating, chest pain, dysphagia with liquids, dysphagia with solids, heartburn, loss of appetite, nausea, vomiting, vomiting blood, weight loss, and  weight gain.      Reports hemorrhoids, rectal bleeding, and  rectal pain.     Denies anal fissure, black tarry stools, change in bowel habit, constipation, diarrhea, diverticulosis, fecal incontinence, heme positive stool, irritable bowel syndrome, jaundice, light color stool, and  liver problems.    Current Medications (verified): 1)  Vytorin 10-80 Mg Tabs (Ezetimibe-Simvastatin) .Marland Kitchen.. 1 By Mouth Qd 2)  Clonazepam 0.5 Mg Tabs (Clonazepam) .Marland Kitchen.. 1 By Mouth Two Times A Day As Needed 3)  Claritin 10 Mg Tabs (Loratadine) .Marland Kitchen.. 1 By Mouth Once Daily 4)  Symbicort 160-4.5 Mcg/act Aero (Budesonide-Formoterol Fumarate) .... 2 Puffs Bid 5)  Proair Hfa 108 (90 Base) Mcg/act Aers (Albuterol Sulfate) .... 2 Puffs Qid As Needed 6)  Preparation H 0.25-3-85.5 % Supp (Pe-Shark Liver Oil-Cocoa Buttr) .... As Needed 7)  Zantac 150 Mg Tabs (Ranitidine Hcl) .... One Tablet By Mouth Once Daily  Allergies (verified): 1)  ! Codeine  Past History:  Past Medical History: High Cholesterol Depression Anxiety  Disorder Arthritis Asthma Hemorrhoids GERD  Past Surgical History: arthroscopic L knee  2010 c-section  Family History: Family History Diabetes 1st degree relative Family History Hypertension Family History Other cancer-Breast No FH of Colon Cancer: Family History of Colon Polyps: Mother   Social History: Unemployed--Student telecomm, IS security [employer]  Married Childern  Current Smoker with Academic librarian  Alcohol Use - yes: occ  Daily Caffeine Use: 4 daily  Illicit Drug Use - no Drug Use:  no  Review of Systems       The patient complains of allergy/sinus, arthritis/joint pain, back pain, muscle pains/cramps, night sweats, sleeping problems, and thirst - excessive.         All other ROS negative except as per HPI.   Vital Signs:  Patient profile:   50 year old female Height:      60.75 inches Weight:      184 pounds BMI:     35.18 BSA:     1.82 Pulse rate:   88 / minute Pulse rhythm:   regular BP sitting:   128 / 74  (left arm) Cuff size:   large  Vitals Entered By: Ok Anis CMA (February 26, 2011 10:37 AM)  Physical Exam  General:  Well developed, well nourished, no acute distress. Eyes:  PERRLA, no icterus. Mouth:  No deformity or lesions, dentition normal. Neck:  Supple; no masses or thyromegaly. Lungs:  Clear throughout to auscultation. Heart:  Regular rate and rhythm; no murmurs, rubs,  or bruits. Abdomen:  soft and nontender without HSM/mass BS+  Rectal:  some anal tags,  fleshy no rash mildly stenotic and mildly tender no masses Extremities:  No clubbing, cyanosis, edema or deformities noted. Cervical Nodes:  No significant cervical or supraclavicular adenopathy.  Psych:  Alert and cooperative. Normal mood and affect.   Impression & Recommendations:  Problem # 1:  HEMATOCHEZIA (ICD-578.1) Assessment New  ? anorectal vs more proximal bleeding she should have a colonoscopy and will do so she is concerned about cost issues and is  applying for sliding scale program Risks, benefits,and indications of endoscopic procedure(s) were reviewed with the patient and all questions answered. samples of analpram given also  Orders: Colonoscopy (Colon)  Patient Instructions: 1)  Use a small smear of Analpram cream on rectal area once daily. 2)  Colonoscopy and Flexible Sigmoidoscopy brochure given.  3)  Copy sent to : Loreen Freud, DO 4)  The medication list was reviewed and reconciled.  All changed / newly prescribed medications were explained.  A complete medication list was provided to the patient / caregiver. Prescriptions: MOVIPREP 100 GM  SOLR (PEG-KCL-NACL-NASULF-NA ASC-C) As per prep instructions.  #1 x 0   Entered by:   Christie Nottingham CMA (AAMA)   Authorized by:   Iva Boop MD, [facility]   Signed by:   Iva Boop MD, FACG on 02/26/2011   Method used:   Electronically to        [facility] [address] (retail)       [phone]       [place], [place]  [address]       Ph: [phone]       Fax: [phone]   RxID:   [id number]

## 2011-03-06 NOTE — Letter (Signed)
Summary: [facility] Instructions  [facility]  [address] [place], [place] [address]   Phone: [phone]  Fax: [phone]       Robin Small    1961/06/06    MRN: [id number]        Procedure Day /Date: Monday April 2nd, 2012     Arrival Time: 2:30pm     Procedure Time: 3:30pm     Location of Procedure:                    _ x_  [facility] (4th Floor)                        PREPARATION FOR COLONOSCOPY WITH MOVIPREP   Starting 5 days prior to your procedure 03/15/11 do not eat nuts, seeds, popcorn, corn, beans, peas,  salads, or any raw vegetables.  Do not take any fiber supplements (e.g. Metamucil, Citrucel, and Benefiber).  THE DAY BEFORE YOUR PROCEDURE         DATE: 03/18/11  DAY: Sunday  1.  Drink clear liquids the entire day-NO SOLID FOOD  2.  Do not drink anything colored red or purple.  Avoid juices with pulp.  No orange juice.  3.  Drink at least 64 oz. (8 glasses) of fluid/clear liquids during the day to prevent dehydration and help the prep work efficiently.  CLEAR LIQUIDS INCLUDE: Water Jello Ice Popsicles Tea (sugar ok, no milk/cream) Powdered fruit flavored drinks Coffee (sugar ok, no milk/cream) Gatorade Juice: apple, white grape, white cranberry  Lemonade Clear bullion, consomm, broth Carbonated beverages (any kind) Strained chicken noodle soup Hard Candy                             4.  In the morning, mix first dose of MoviPrep solution:    Empty 1 Pouch A and 1 Pouch B into the disposable container    Add lukewarm drinking water to the top line of the container. Mix to dissolve    Refrigerate (mixed solution should be used within 24 hrs)  5.  Begin drinking the prep at 5:00 p.m. The MoviPrep container is divided by 4 marks.   Every 15 minutes drink the solution down to the next mark (approximately 8 oz) until the full liter is complete.   6.  Follow completed prep with 16 oz of clear liquid of your choice  (Nothing red or purple).  Continue to drink clear liquids until bedtime.  7.  Before going to bed, mix second dose of MoviPrep solution:    Empty 1 Pouch A and 1 Pouch B into the disposable container    Add lukewarm drinking water to the top line of the container. Mix to dissolve    Refrigerate  THE DAY OF YOUR PROCEDURE      DATE: 03/19/11 DAY: Monday  Beginning at 10:30 a.m. (5 hours before procedure):         1. Every 15 minutes, drink the solution down to the next mark (approx 8 oz) until the full liter is complete.  2. Follow completed prep with 16 oz. of clear liquid of your choice.    3. You may drink clear liquids until 1:30pm (2 HOURS BEFORE PROCEDURE).   MEDICATION INSTRUCTIONS  Unless otherwise instructed, you should take regular prescription medications with a small sip of water   as early as possible the morning of your  procedure.          OTHER INSTRUCTIONS  You will need a responsible adult at least 48 years of age to accompany you and drive you home.   This person must remain in the waiting room during your procedure.  Wear loose fitting clothing that is easily removed.  Leave jewelry and other valuables at home.  However, you may wish to bring a book to read or  an iPod/MP3 player to listen to music as you wait for your procedure to start.  Remove all body piercing jewelry and leave at home.  Total time from sign-in until discharge is approximately 2-3 hours.  You should go home directly after your procedure and rest.  You can resume normal activities the  day after your procedure.  The day of your procedure you should not:   Drive   Make legal decisions   Operate machinery   Drink alcohol   Return to work  You will receive specific instructions about eating, activities and medications before you leave.    The above instructions have been reviewed and explained to me by   _______________________    I fully understand and can verbalize  these instructions _____________________________ Date _________

## 2011-03-19 ENCOUNTER — Telehealth: Payer: Self-pay | Admitting: *Deleted

## 2011-03-19 ENCOUNTER — Encounter: Payer: Self-pay | Admitting: Internal Medicine

## 2011-03-19 ENCOUNTER — Ambulatory Visit (AMBULATORY_SURGERY_CENTER): Payer: Self-pay | Admitting: Internal Medicine

## 2011-03-19 ENCOUNTER — Other Ambulatory Visit: Payer: Self-pay | Admitting: Internal Medicine

## 2011-03-19 VITALS — BP 135/70 | HR 68 | Temp 97.2°F | Resp 16 | Ht 61.0 in | Wt 185.0 lb

## 2011-03-19 DIAGNOSIS — K620 Anal polyp: Secondary | ICD-10-CM

## 2011-03-19 DIAGNOSIS — K648 Other hemorrhoids: Secondary | ICD-10-CM

## 2011-03-19 DIAGNOSIS — K644 Residual hemorrhoidal skin tags: Secondary | ICD-10-CM

## 2011-03-19 DIAGNOSIS — K621 Rectal polyp: Secondary | ICD-10-CM

## 2011-03-19 DIAGNOSIS — D126 Benign neoplasm of colon, unspecified: Secondary | ICD-10-CM

## 2011-03-19 DIAGNOSIS — K625 Hemorrhage of anus and rectum: Secondary | ICD-10-CM

## 2011-03-19 DIAGNOSIS — K921 Melena: Secondary | ICD-10-CM

## 2011-03-19 NOTE — Patient Instructions (Signed)
Resume medications today. Handout for polyps, hemorrhoids,high fiber diet given

## 2011-03-19 NOTE — Telephone Encounter (Signed)
Patient arrived in [facility] prepped for a colonoscopy, not on our schedule because she had previously cancelled her procedure.  Dr. Leone Payor agreed to do the procedure.

## 2011-03-20 ENCOUNTER — Telehealth: Payer: Self-pay | Admitting: *Deleted

## 2011-03-20 NOTE — Telephone Encounter (Signed)

## 2011-03-27 ENCOUNTER — Encounter: Payer: Self-pay | Admitting: Internal Medicine

## 2011-03-27 NOTE — Progress Notes (Signed)
Quick Note:  One adenoma 5year colon recall 2017 ______

## 2011-04-06 DIAGNOSIS — Z860100 Personal history of colon polyps, unspecified: Secondary | ICD-10-CM

## 2011-04-06 HISTORY — DX: Personal history of colonic polyps: Z86.010

## 2011-04-06 HISTORY — DX: Personal history of colon polyps, unspecified: Z86.0100

## 2011-05-04 NOTE — H&P (Signed)
NAME:  Robin Small, Robin Small                          ACCOUNT NO.:  [id number]   MEDICAL RECORD NO.:  [id number]                   PATIENT TYPE:  AMB   LOCATION:  SDC                                  FACILITY:  [facility]   PHYSICIAN:  Juluis Mire, M.D.                DATE OF BIRTH:  10-Jan-1961   DATE OF ADMISSION:  11/18/2003  DATE OF DISCHARGE:                                HISTORY & PHYSICAL   BRIEF HISTORY:  Patient is a 50 year old gravida 1, para 1-0-0-2 female who  presents for laparoscopic bilateral tubal fulguration.   __________ prior to admission.  Patient has been on Depo-Provera.  She  desires for permanent sterilization.  Alternative forms of birth control  have been discussed.  The potential irreversibility of sterilization  explained.  A failure rate of 1 in 200 was quoted.  Failures can be in the  form of ectopic pregnancy that can require further surgical management.   ALLERGIES:  She has no known drug allergies.   MEDICATIONS INCLUDE:  Lexapro and Sonata.   PAST MEDICAL HISTORY:  Usual childhood diseases; no significant sequelae.   SURGICAL HISTORY:  She had ankle surgery for tendon problems approximately 3  or 4 years ago.  She has had one prior cesarean section.   FAMILY HISTORY:  Noncontributory.   SOCIAL HISTORY:  No tobacco and only occasional alcohol use.   REVIEW OF SYSTEMS:  Noncontributory.   PHYSICAL EXAMINATION:  GENERAL:  Patient afebrile with stable vital signs.  HEENT:  Patient normocephalic.  Pupils equal, round and reactive to light  and accommodation.  Extraocular movements are intact.  Sclerae and  conjunctivae clear.  Oropharynx clear.  NECK:  Without thyromegaly.  BREASTS:  No discrete masses.  LUNGS:  Clear.  CARDIOVASCULAR SYSTEM:  Regular rhythm and rate without murmurs or gallops.  ABDOMEN:  Benign; no mass, organomegaly, or tenderness.  PELVIC:  Normal external genitalia.  Vaginal mucosa is clear.  Cervix  unremarkable.  Uterus normal  size, shape, and contour.  Adnexa free of  masses or tenderness.  EXTREMITIES:  Trace edema.  NEUROLOGICAL:  Grossly within normal limits.   IMPRESSION:  Desirous of permanent sterilization.   PLAN:  The patient to undergo laparoscopic bilateral tubal fulguration.  Risks of surgery have been explained including the risk of infection.  The  risk of hemorrhage can necessitate transfusion with the risk of AIDS or  hepatitis.  Risk of injury to adjacent organs including bladder, bowel, or  ureters which could require further exploratory surgery, the risk of deep  vein thrombosis and pulmonary embolus.  The patient expressed understanding  of indication and risk and __________.  Juluis Mire, M.D.    JSM/MEDQ  D:  11/18/2003  T:  11/18/2003  Job:  [id number]

## 2011-05-04 NOTE — H&P (Signed)
NAMEFREYA, ZOBRIST                ACCOUNT NO.:  [id number]   MEDICAL RECORD NO.:  [id number]          PATIENT TYPE:  AMB   LOCATION:  SDC                           [facility]ILITY:  [facility]   PHYSICIAN:  Juluis Mire, M.D.   DATE OF BIRTH:  05-24-1961   DATE OF ADMISSION:  02/21/2007  DATE OF DISCHARGE:                              HISTORY & PHYSICAL   HISTORY OF PRESENT ILLNESS:  A  50 year old year old gravida 1, para 1-0-  0-2 female presents for the endometrial ablation.  Patient has had a  previous bilateral tubal ligation.  Her cycles are relatively long and  heavy.  She has 7-10 days of flow, changing pads and tampons  approximately every 1-2 hours.  She will soak through these at some  point.  The patient does smoke and therefore birth control pills were  contraindicated.  Previous saline infusion ultrasound highly suggestive  of adenomyosis.  Alternatives have been discussed.  She now presents for  ablation using the NovaSure.   ALLERGIES:  NO KNOWN DRUG ALLERGIES.   MEDICATIONS:  1. Vitamins.  2. Clozaprim.   PAST MEDICAL HISTORY:  Usual childhood diseases.  No significant  sequelae.   SURGICAL HISTORY:  1. She had ankle surgery for tendon problems approximately 6 years      ago.  2. She has had one prior cesarean section.  3. Previous bilateral tubal ligation.   FAMILY HISTORY:  Noncontributory.   SOCIAL HISTORY:  Reports pack per day tobacco use.  Occasional alcohol  use.   REVIEW OF SYSTEMS:  Noncontributory.   PHYSICAL EXAMINATION:  VITAL SIGNS:  Afebrile, stable vital signs.  HEENT:  Patient normocephalic.  Pupils equal and react to light  accommodation.  Extraocular were intact.  Sclerae are clear.  Oropharynx  clear.  NECK:  Without thyromegaly.  BREASTS:  No discrete masses.  LUNGS:  Clear.  CARDIOVASCULAR:  Regular rate without murmurs, rubs or gallops.  ABDOMEN:  Benign.  No mass, organomegaly or tenderness.  PELVIC:  Normal external genitalia.  Vaginal  mucosa is clear.  Cervix  unremarkable.  Use upper limits of normal size, boggy consistent with  adenomyosis.  Adnexa unremarkable.  EXTREMITIES:  Trace edema.  NEUROLOGICAL:  Grossly within normal limits.   IMPRESSION:  Menorrhagia secondary to adenomyosis.   PLAN:  The patient will undergo endometrial ablation.  Success rates of  89% are quoted.  Amenorrhea rates of 40% quoted.  Risks explained  including the risk of infection.  The risk of vascular injury that could  lead to hemorrhage leading to possible need for transfusion with  associated risk of AIDS or hepatitis.  Continued bleeding could lead to  need for hysterectomy.  There is a  risk of perforation that could lead  to injury to adjacent organs such as bowel requiring further exploratory  surgery.  Risk of deep venous thrombosis and pulmonary embolus.  The  patient expressed understanding of indications, risks and other  alternatives.      Juluis Mire, M.D.  Electronically Signed     JSM/MEDQ  D:  02/21/2007  T:  02/21/2007  Job:  [id number]

## 2011-05-04 NOTE — Op Note (Signed)
NAME:  Robin Small, Robin Small                          ACCOUNT NO.:  [id number]   MEDICAL RECORD NO.:  [id number]                   PATIENT TYPE:  AMB   LOCATION:  SDC                                  [facility]   PHYSICIAN:  Juluis Mire, M.D.                DATE OF BIRTH:  [birth year]   DATE OF PROCEDURE:  11/18/2003  DATE OF DISCHARGE:                                 OPERATIVE REPORT   PREOPERATIVE DIAGNOSIS:  Desires sterility.   POSTOPERATIVE DIAGNOSIS:  Desires sterility.   OPERATION:  Diagnostic laparoscopy, lysis of adhesions, bilateral tubal  fulguration.   SURGEON:  Juluis Mire, M.D.   ANESTHESIA:  General endotracheal anesthesia.   ESTIMATED BLOOD LOSS:  Minimal.   PACKS AND DRAINS:  None.   INTRAOPERATIVE BLOOD PLACED:  None.   COMPLICATIONS:  None.   INDICATIONS FOR PROCEDURE:  Dictated in history and physical.   DESCRIPTION OF PROCEDURE:  The patient taken to the OR and placed in the  supine position.  After satisfactory level of general endotracheal  anesthesia obtained, the patient was placed in the dorsal lithotomy position  using the Allen stirrups.  PAS stockings were in place.  The patient's  abdomen, perineum and vagina were prepped out with Betadine.  A Hulka  tenaculum was put in place and secured.  Bladder was emptied with in and out  catheterization.  The patient was draped for a sterile field.  A  subumbilical incision was made with the knife.  The Veress needle was  introduced in the abdominal cavity.  Abdomen was insufflated with  approximately 3 liters of carbon dioxide.  The operating laparoscope was  then introduced.  Visualization did reveal some periumbilical omental  adhesions.  There was no bowel or other evidence of organs in the omentum.  We did have to take some of the omentum down using the bipolar and scissors.  We obtained access to the lower pelvic cavity.  Uterus was upper limits of  normal size.  Tubes and ovaries were  unremarkable.  She did have some  superficial implants of endometriosis noted actually on the bladder flap  area.  These areas were cauterized.  Then a bilateral tubal fulguration was  performed using the bipolar.  A 2 cm segment of each tube was coagulated  until resistance read 0, coagulation was undertaken and extended out to the  mesosalpinx.  The same segment of tube was then recoagulated.  Both tubes  were adequately coagulated.  There was no evidence of injury to adjacent  organs.  Bowel was desufflated of its carbon dioxide.  All trocars removed.  Subumbilical incision was closed with interrupted subcuticulars of 4-0  Vicryl.  The Hulka tenaculum was then removed.  The patient was taken out of  the dorsal lithotomy position.  Once extubated and alert, transferred to the  recovery room in good condition.  Sponge, needle and  instrument counts were  reported as correct by the circulating nurse.                                               Juluis Mire, M.D.    JSM/MEDQ  D:  11/18/2003  T:  11/18/2003  Job:  [id number]

## 2011-05-04 NOTE — Op Note (Signed)
Robin Small, Robin Small                ACCOUNT NO.:  [id number]   MEDICAL RECORD NO.:  [id number]          PATIENT TYPE:  AMB   LOCATION:  SDC                           [facility]   PHYSICIAN:  Juluis Mire, M.D.   DATE OF BIRTH:  19-Aug-1961   DATE OF PROCEDURE:  DATE OF DISCHARGE:                               OPERATIVE REPORT   PREOPERATIVE DIAGNOSIS:  Menorrhagia secondary to adenomyosis.   POSTOPERATIVE DIAGNOSIS:  Menorrhagia secondary to adenomyosis.   PROCEDURES:  1. Paracervical block.  2. Hysteroscopy.  3. Endometrial ablation using the NovaSure.   SURGEON:  Juluis Mire, M.D.   ANESTHESIA:  Paracervical block and general anesthesia.   ESTIMATED BLOOD LOSS:  Was minimal.   PACKS AND DRAINS:  None.   INTRAOPERATIVE BLOOD REPLACED:  None.   COMPLICATIONS:  None.   INDICATIONS:  As noted in history and physical.   PROCEDURE:  The patient was taken to the OR, placed in supine position.  After a satisfactory level of general anesthesia obtained, the patient  was placed in the dorsal lithotomy position using Allen stirrups.  Perineum and vagina were cleansed out Betadine and draped as a sterile  field.  The bladder was emptied by in-and-out catheterization.  A  speculum was placed in the vaginal vault.  The cervix was grasped with a  single-tooth tenaculum.  The uterus sounded to approximately 9 cm.  Endocervical length was 4 cm for a cavity length of 5.  At this point in  time we put a paracervical block in using 0.25% % Marcaine.  Cervix was  serially dilated to a size 31 Pratt dilator.  Hysteroscope was  introduced.  The intrauterine cavity was distended using lactated  Ringer's.  Visualization revealed a normal endometrial cavity.  No  polyps or abnormalities.  Endometrial curettings were obtained and sent  for pathologic review.  At this point in time the NovaSure was brought  in place.  It was placed in the uterus and properly deployed.  The  cavity width  with 4.3 cm.  We ablated for 50 seconds at a power of 110.  The NovaSure was  then removed intact.  The single-tooth tenaculum and speculum removed.  The patient taken out of the dorsal lithotomy position, once alert and  extubated transferred to the recovery room in good condition.  Sponge,  instrument and needle count reported as correct by the circulating nurse  x2.      Juluis Mire, M.D.  Electronically Signed     JSM/MEDQ  D:  02/21/2007  T:  02/21/2007  Job:  [id number]

## 2011-05-04 NOTE — Assessment & Plan Note (Signed)
[facility]                                   ON-CALL NOTE   NAME:Robin Small, Robin Small                         MRN:          [id number]  DATE:10/06/2006                            DOB:          [birth year]    PHONE NUMBER:  [phone].   SUBJECTIVE:  Ear ache for one week, saw doctor on Tuesday, was given  amoxicillin for 10 days.  She had a hole in her jacket pocket and when was  at a play last night, her antibiotics fell through.   ASSESSMENT/PLAN:  Ear infection/sinus infection:  [facility] was called and a  continuation of her antibiotics was given for seven days.  They did verify  that she picked up and paid for the antibiotics three days ago.       Kerby Nora, MD      AB/MedQ  DD:  10/06/2006  DT:  10/07/2006  Job #:  [id number]   cc:   Lelon Perla, DO

## 2011-05-04 NOTE — Assessment & Plan Note (Signed)
[facility]                                 ON-CALL NOTE   NAME:Skillen, Tagen                         MRN:          [id number]  DATE:05/24/2007                            DOB:          1961/01/02    Phone number is [phone].  Regular doctor is Dr. Laury Axon.   CHIEF COMPLAINT:  Headache and head congestion.  Patient states she had  an insect bite yesterday in the right arm/neck area.  She used some  topical Benadryl, and took some oral Benadryl.  It is improved this  morning, but she has had a left-sided headache that is throbbing.  She  thought it was from congestion from the Benadryl, so she tried some  Mucinex and has used an ice pack.  She has also taken ibuprofen with no  relief.  She does not have a history of migraines.  She has no fever,  chills, or neurologic changes.  I told her that she can try some nasal  saline further for congestion, or some Sudafed if she wants to, but I am  not convinced this headache is coming from congestion.  If it does not  get better with those, or with a dose of Tylenol, I told her to go on in  to Urgent Care and get it checked out today, and that is what she is  going to do.     Marne A. Tower, MD  Electronically Signed    MAT/MedQ  DD: 05/24/2007  DT: 05/24/2007  Job #: [id number]   cc:   Lelon Perla, DO

## 2011-05-11 ENCOUNTER — Other Ambulatory Visit: Payer: Self-pay | Admitting: *Deleted

## 2011-05-15 MED ORDER — CLONAZEPAM 0.5 MG PO TABS: ORAL_TABLET | ORAL | Status: DC

## 2011-05-30 ENCOUNTER — Other Ambulatory Visit: Payer: Self-pay | Admitting: Family Medicine

## 2011-05-30 NOTE — Telephone Encounter (Signed)
Pt never picked up Rx on 05-11-11, call pharmacy and cancel Rx. Rx sent to new pharmacy at Pt request.

## 2011-09-19 ENCOUNTER — Other Ambulatory Visit: Payer: Self-pay | Admitting: Family Medicine

## 2011-09-19 MED ORDER — CLONAZEPAM 0.5 MG PO TABS: 0.5000 mg | ORAL_TABLET | Freq: Two times a day (BID) | ORAL | Status: DC | PRN

## 2011-09-19 NOTE — Telephone Encounter (Signed)
Last seen 09/22/10 and filled 05/30/11 please advise    KP

## 2011-09-19 NOTE — Telephone Encounter (Signed)
faxed          KP  

## 2011-11-27 ENCOUNTER — Other Ambulatory Visit: Payer: Self-pay | Admitting: Family Medicine

## 2011-12-27 ENCOUNTER — Telehealth: Payer: Self-pay

## 2011-12-27 NOTE — Telephone Encounter (Signed)
Apt scheduled---samples of Vytorin 10-40 left at check in. Ok per [employer]    KP

## 2011-12-27 NOTE — Telephone Encounter (Signed)
She really needs a physical with labs--- has she talked to [facility] billing to see about sliding scale---that could help?  The girls up front have the info.

## 2011-12-27 NOTE — Telephone Encounter (Signed)
msg left or a return call.      KP

## 2011-12-27 NOTE — Telephone Encounter (Signed)
Msg from patient stating she needs an apt but she does not have any insurance. She is needing to get a refill for hr Vytorin and forms complete for the patient assistance program. She wants to know exactly what she needs to have done or if she just needs labs. She was last seen for a CPX-09/22/10 and this is when the labs were last done. Please advise     KP

## 2012-01-03 ENCOUNTER — Telehealth: Payer: Self-pay | Admitting: Family Medicine

## 2012-01-03 MED ORDER — CLONAZEPAM 0.5 MG PO TABS: 0.5000 mg | ORAL_TABLET | Freq: Two times a day (BID) | ORAL | Status: DC | PRN

## 2012-01-03 NOTE — Telephone Encounter (Signed)
Patient had an appt for tomorrow but canceled because of the snow. She wants to know if Dr. Laury Axon will refill clonazepam to last her until her appt in mid February. [facility] on [address]

## 2012-01-03 NOTE — Telephone Encounter (Signed)
Ok to refill med--- disregard thyroid comment--- made in wrong chart

## 2012-01-03 NOTE — Telephone Encounter (Signed)
She is hyperthyroid not hypothyroid--- she needs to see endo--- they will rx med

## 2012-01-03 NOTE — Telephone Encounter (Signed)
Last 09/22/10 and seen 09/19/11 #60. Please advise     KP

## 2012-01-03 NOTE — Telephone Encounter (Signed)
Faxed.   KP 

## 2012-01-04 ENCOUNTER — Ambulatory Visit: Payer: Self-pay | Admitting: Family Medicine

## 2012-02-08 ENCOUNTER — Other Ambulatory Visit (HOSPITAL_COMMUNITY)
Admission: RE | Admit: 2012-02-08 | Discharge: 2012-02-08 | Disposition: A | Discharge status: Home / Self Care | Payer: Self-pay | Source: Ambulatory Visit | Attending: Family Medicine | Admitting: Family Medicine

## 2012-02-08 ENCOUNTER — Encounter: Payer: Self-pay | Admitting: Family Medicine

## 2012-02-08 ENCOUNTER — Ambulatory Visit (INDEPENDENT_AMBULATORY_CARE_PROVIDER_SITE_OTHER): Payer: Self-pay | Admitting: Family Medicine

## 2012-02-08 DIAGNOSIS — F341 Dysthymic disorder: Secondary | ICD-10-CM

## 2012-02-08 DIAGNOSIS — M2550 Pain in unspecified joint: Secondary | ICD-10-CM

## 2012-02-08 DIAGNOSIS — Z9109 Other allergy status, other than to drugs and biological substances: Secondary | ICD-10-CM

## 2012-02-08 DIAGNOSIS — Z124 Encounter for screening for malignant neoplasm of cervix: Secondary | ICD-10-CM

## 2012-02-08 DIAGNOSIS — J45909 Unspecified asthma, uncomplicated: Secondary | ICD-10-CM

## 2012-02-08 DIAGNOSIS — J309 Allergic rhinitis, unspecified: Secondary | ICD-10-CM

## 2012-02-08 DIAGNOSIS — L259 Unspecified contact dermatitis, unspecified cause: Secondary | ICD-10-CM

## 2012-02-08 DIAGNOSIS — IMO0001 Reserved for inherently not codable concepts without codable children: Secondary | ICD-10-CM

## 2012-02-08 DIAGNOSIS — M791 Myalgia, unspecified site: Secondary | ICD-10-CM | POA: Insufficient documentation

## 2012-02-08 DIAGNOSIS — Z01419 Encounter for gynecological examination (general) (routine) without abnormal findings: Secondary | ICD-10-CM | POA: Insufficient documentation

## 2012-02-08 DIAGNOSIS — Z1239 Encounter for other screening for malignant neoplasm of breast: Secondary | ICD-10-CM

## 2012-02-08 DIAGNOSIS — Z0000 Encounter for general adult medical examination without abnormal findings: Secondary | ICD-10-CM

## 2012-02-08 DIAGNOSIS — L309 Dermatitis, unspecified: Secondary | ICD-10-CM

## 2012-02-08 DIAGNOSIS — E785 Hyperlipidemia, unspecified: Secondary | ICD-10-CM

## 2012-02-08 LAB — HEPATIC FUNCTION PANEL
ALT: 31 U/L (ref 0–35)
AST: 22 U/L (ref 0–37)
Bilirubin, Direct: 0 mg/dL (ref 0.0–0.3)
Total Protein: 7.8 g/dL (ref 6.0–8.3)

## 2012-02-08 LAB — CBC WITH DIFFERENTIAL/PLATELET
Basophils Relative: 0.2 % (ref 0.0–3.0)
Eosinophils Relative: 3 % (ref 0.0–5.0)
HCT: 42.4 % (ref 36.0–46.0)
Lymphs Abs: 3 10*3/uL (ref 0.7–4.0)
MCV: 88.8 fl (ref 78.0–100.0)
Monocytes Absolute: 0.6 10*3/uL (ref 0.1–1.0)
Neutrophils Relative %: 54.3 % (ref 43.0–77.0)
RBC: 4.77 Mil/uL (ref 3.87–5.11)
WBC: 8.5 10*3/uL (ref 4.5–10.5)

## 2012-02-08 LAB — LIPID PANEL
Total CHOL/HDL Ratio: 4
Triglycerides: 196 mg/dL — ABNORMAL HIGH (ref 0.0–149.0)

## 2012-02-08 LAB — BASIC METABOLIC PANEL
Chloride: 100 mEq/L (ref 96–112)
Potassium: 4.1 mEq/L (ref 3.5–5.1)

## 2012-02-08 LAB — POCT URINALYSIS DIPSTICK
Bilirubin, UA: NEGATIVE
Blood, UA: NEGATIVE
Glucose, UA: NEGATIVE
Leukocytes, UA: NEGATIVE
Nitrite, UA: NEGATIVE

## 2012-02-08 LAB — TSH: TSH: 0.91 u[IU]/mL (ref 0.35–5.50)

## 2012-02-08 LAB — SEDIMENTATION RATE: Sed Rate: 21 mm/hr (ref 0–22)

## 2012-02-08 MED ORDER — BUDESONIDE-FORMOTEROL FUMARATE 160-4.5 MCG/ACT IN AERO: 2.0000 | INHALATION_SPRAY | Freq: Two times a day (BID) | RESPIRATORY_TRACT | Status: DC

## 2012-02-08 MED ORDER — CLOTRIMAZOLE-BETAMETHASONE 1-0.05 % EX CREA: TOPICAL_CREAM | Freq: Two times a day (BID) | CUTANEOUS | Status: DC

## 2012-02-08 MED ORDER — LEVOCETIRIZINE DIHYDROCHLORIDE 5 MG PO TABS: ORAL_TABLET | ORAL | Status: DC

## 2012-02-08 MED ORDER — MOMETASONE FURO-FORMOTEROL FUM 100-5 MCG/ACT IN AERO: 2.0000 | INHALATION_SPRAY | Freq: Two times a day (BID) | RESPIRATORY_TRACT | Status: DC

## 2012-02-08 NOTE — Assessment & Plan Note (Signed)
Check labs 

## 2012-02-08 NOTE — Assessment & Plan Note (Signed)
con't meds  Check labs 

## 2012-02-08 NOTE — Progress Notes (Signed)
Subjective:     Robin Small is a 51 y.o. female and is here for a comprehensive physical exam. The patient reports no problems.  History   Social History  . Marital Status: Married    Spouse Name: N/A    Number of Children: N/A  . Years of Education: N/A   Occupational History  . studen    Social History Main Topics  . Smoking status: Former Smoker -- 0.5 packs/day for 20 years    Types: Cigarettes    Quit date: 10/08/2011  . Smokeless tobacco: Never Used  . Alcohol Use: 0.5 oz/week    1 drink(s) per week  . Drug Use: No  . Sexually Active: Yes -- Female partner(s)   Other Topics Concern  . Not on file   Social History Narrative  . No narrative on file   Health Maintenance  Topic Date Due  . Mammogram  01/04/1981  . Pap Smear  01/04/1981  . Influenza Vaccine  09/16/2012  . Tetanus/tdap  12/15/2014  . Colonoscopy  03/18/2016    The following portions of the patient's history were reviewed and updated as appropriate: allergies, current medications, past family history, past medical history, past social history, past surgical history and problem list.  Review of Systems Review of Systems  Constitutional: Negative for activity change, appetite change and fatigue.  HENT: Negative for hearing loss, congestion, tinnitus and ear discharge.  dentist-due Eyes: Negative for visual disturbance (see optho q2y -- vision corrected to 20/20 with glasses).  Respiratory: Negative for cough, chest tightness and shortness of breath.   Cardiovascular: Negative for chest pain, palpitations and leg swelling.  Gastrointestinal: Negative for abdominal pain, diarrhea, constipation and abdominal distention.  Genitourinary: Negative for urgency, frequency, decreased urine volume and difficulty urinating.  Musculoskeletal: Negative for back pain, arthralgias and gait problem.  Skin: Negative for color change, pallor and rash.  Neurological: Negative for dizziness, light-headedness, numbness and  headaches.  Hematological: Negative for adenopathy. Does not bruise/bleed easily.  Psychiatric/Behavioral: Negative for suicidal ideas, confusion, sleep disturbance, self-injury, dysphoric mood, decreased concentration and agitation.       Objective:    BP 110/72  Pulse 69  Temp(Src) 98.3 F (36.8 C) (Oral)  Ht 5' 2.5" (1.588 m)  Wt 190 lb 3.2 oz (86.274 kg)  BMI 34.23 kg/m2  SpO2 94% General appearance: alert, cooperative, appears stated age and no distress Head: Normocephalic, without obvious abnormality, atraumatic Eyes: conjunctivae/corneas clear. PERRL, EOM's intact. Fundi benign. Ears: normal TM's and external ear canals both ears Nose: Nares normal. Septum midline. Mucosa normal. No drainage or sinus tenderness. Throat: lips, mucosa, and tongue normal; teeth and gums normal Neck: no adenopathy, no carotid bruit, no JVD, supple, symmetrical, trachea midline and thyroid not enlarged, symmetric, no tenderness/mass/nodules Back: symmetric, no curvature. ROM normal. No CVA tenderness. Lungs: clear to auscultation bilaterally Breasts: normal appearance, no masses or tenderness Heart: regular rate and rhythm, S1, S2 normal, no murmur, click, rub or gallop Abdomen: soft, non-tender; bowel sounds normal; no masses,  no organomegaly Pelvic: cervix normal in appearance, external genitalia normal, no adnexal masses or tenderness, no cervical motion tenderness, rectovaginal septum normal, uterus normal size, shape, and consistency and vagina normal without discharge Extremities: extremities normal, atraumatic, no cyanosis or edema Pulses: 2+ and symmetric Skin: + rough round errythematous spot back R thigh--itchy Lymph nodes: Cervical, supraclavicular, and axillary nodes normal. Neurologic: Alert and oriented X 3, normal strength and tone. Normal symmetric reflexes. Normal coordination and gait psych--  anxiety , depression    Assessment:    Healthy female exam.    hyperlipidemia--check labs, con't meds  anxiety--stable     Plan:  ghm utd  Check labs See After Visit Summary for Counseling Recommendations

## 2012-02-08 NOTE — Patient Instructions (Signed)

## 2012-02-08 NOTE — Assessment & Plan Note (Signed)
con't proair dulera inh

## 2012-02-08 NOTE — Assessment & Plan Note (Signed)
Cont meds   

## 2012-02-11 LAB — ANA: Anti Nuclear Antibody(ANA): NEGATIVE

## 2012-02-25 ENCOUNTER — Other Ambulatory Visit: Payer: Self-pay | Admitting: Family Medicine

## 2012-02-26 NOTE — Telephone Encounter (Signed)
Last seen 02/08/12 and filled 01/03/12 # 60. Please advise     KP

## 2012-03-03 ENCOUNTER — Telehealth: Payer: Self-pay | Admitting: *Deleted

## 2012-03-03 MED ORDER — EZETIMIBE-SIMVASTATIN 10-80 MG PO TABS: 1.0000 | ORAL_TABLET | Freq: Every day | ORAL | Status: DC

## 2012-03-03 NOTE — Telephone Encounter (Signed)
#  30 OK if available

## 2012-03-03 NOTE — Telephone Encounter (Signed)
Pt states that she is waiting on Pt assistance program but is currently needs med. Pt advise that we do not have any Vytorin 10-80 samples in the office however we do have 10-40. Pt notes that she was given this in the pass and would like to have samples of this.Please advise if ok to give 10-40    Pt also requesting a Rx sent to pharmacy to see if assistance program has kick in yet. .Rx sent.

## 2012-03-04 NOTE — Telephone Encounter (Signed)
Left Pt detail message samples placed up front for pick up. 

## 2012-04-14 ENCOUNTER — Encounter: Payer: Self-pay | Admitting: Family Medicine

## 2012-04-14 NOTE — Telephone Encounter (Signed)
The patient was put on Celebrex 09/22/2010 by Dr.Hopper for Tennis Elbow and removed from the med list at Dr.Guessners 02/2011. No longer on the medication list. Will the patient need and apt for an RX. Please advise    KP

## 2012-04-14 NOTE — Telephone Encounter (Signed)
This was likely removed by Dr Leone Payor b/c pt had GI bleed and ongoing use of NSAIDs is most common cause.  Will need to discuss restart w/ Dr Laury Axon when she returns.

## 2012-04-21 ENCOUNTER — Other Ambulatory Visit: Payer: Self-pay | Admitting: Family Medicine

## 2012-04-21 NOTE — Telephone Encounter (Signed)
Last seen 02/08/12 and filled 02/25/2012 # 60. Please advise    KP

## 2012-06-24 ENCOUNTER — Other Ambulatory Visit: Payer: Self-pay | Admitting: Family Medicine

## 2012-06-24 NOTE — Telephone Encounter (Signed)
Last OV 02-08-12, last filled 04-21-12 #60

## 2012-09-01 ENCOUNTER — Other Ambulatory Visit: Payer: Self-pay | Admitting: Family Medicine

## 2012-09-01 NOTE — Telephone Encounter (Signed)
Last seen 02/08/12 and filled 06/24/12 # 60. Please advise     KP

## 2012-10-23 ENCOUNTER — Other Ambulatory Visit: Payer: Self-pay | Admitting: Family Medicine

## 2012-10-23 NOTE — Telephone Encounter (Signed)
Last seen 02/08/12 and filled 09/01/12 # 60. Please advise    KP

## 2012-11-06 ENCOUNTER — Other Ambulatory Visit: Payer: Self-pay | Admitting: Family Medicine

## 2012-11-06 ENCOUNTER — Telehealth: Payer: Self-pay | Admitting: Family Medicine

## 2012-11-06 DIAGNOSIS — G4700 Insomnia, unspecified: Secondary | ICD-10-CM

## 2012-11-06 NOTE — Telephone Encounter (Signed)
Referral for pulm put in

## 2012-11-06 NOTE — Telephone Encounter (Signed)
Patient states that she would like to be referred to someone for sleep apnea.

## 2012-11-06 NOTE — Telephone Encounter (Signed)
Spoke with patient and she has not have a sleep study but she is not sleeping well and she stops breathing, she now has Acupuncturist for insurance. Please advise     KP

## 2012-11-06 NOTE — Telephone Encounter (Signed)
Patient also wanted you to know she does not work on Thursday and Fridays      KP

## 2012-12-02 ENCOUNTER — Institutional Professional Consult (permissible substitution): Payer: Self-pay | Admitting: Pulmonary Disease

## 2012-12-25 ENCOUNTER — Other Ambulatory Visit: Payer: Self-pay | Admitting: Family Medicine

## 2012-12-25 NOTE — Telephone Encounter (Signed)
OK to refill? Last OV 2.22.13 Last filled 11.7.13 #60 0RF

## 2013-01-08 ENCOUNTER — Encounter: Payer: Self-pay | Admitting: Pulmonary Disease

## 2013-01-08 ENCOUNTER — Ambulatory Visit (INDEPENDENT_AMBULATORY_CARE_PROVIDER_SITE_OTHER): Payer: BC Managed Care – PPO | Admitting: Pulmonary Disease

## 2013-01-08 ENCOUNTER — Ambulatory Visit (INDEPENDENT_AMBULATORY_CARE_PROVIDER_SITE_OTHER)
Admission: RE | Admit: 2013-01-08 | Discharge: 2013-01-08 | Disposition: A | Discharge status: Home / Self Care | Payer: BC Managed Care – PPO | Source: Ambulatory Visit | Attending: Pulmonary Disease | Admitting: Pulmonary Disease

## 2013-01-08 VITALS — BP 114/80 | HR 80 | Temp 97.1°F | Ht 61.5 in | Wt 194.8 lb

## 2013-01-08 DIAGNOSIS — R0609 Other forms of dyspnea: Secondary | ICD-10-CM

## 2013-01-08 DIAGNOSIS — G4730 Sleep apnea, unspecified: Secondary | ICD-10-CM | POA: Insufficient documentation

## 2013-01-08 DIAGNOSIS — R0600 Dyspnea, unspecified: Secondary | ICD-10-CM

## 2013-01-08 DIAGNOSIS — R0989 Other specified symptoms and signs involving the circulatory and respiratory systems: Secondary | ICD-10-CM

## 2013-01-08 DIAGNOSIS — G4733 Obstructive sleep apnea (adult) (pediatric): Secondary | ICD-10-CM | POA: Insufficient documentation

## 2013-01-08 MED ORDER — ALBUTEROL SULFATE HFA 108 (90 BASE) MCG/ACT IN AERS: 2.0000 | INHALATION_SPRAY | Freq: Four times a day (QID) | RESPIRATORY_TRACT | Status: DC | PRN

## 2013-01-08 NOTE — Assessment & Plan Note (Signed)
She has snoring, sleep disruption, witnessed apnea, and daytime sleepiness.  She has history of depression and hypertension.  She is a forming smoker with dyspnea, and there is concern for COPD.  I am concerned she could have sleep apnea.  I have explained how sleep apnea can affect the patient's health.  Driving precautions and importance of weight loss were discussed.  Treatment options for sleep apnea were reviewed.  To further assess will arrange for in lab sleep study.

## 2013-01-08 NOTE — Progress Notes (Signed)
Chief Complaint  Patient presents with  . Advice Only    c/o snoring, per spouse stops breathing at night, have trouble falling asleep, staying asleep    History of Present Illness: Robin Small is a 52 y.o. female for evaluation of dyspnea and snoring.  She has noticed trouble with her breathing for some time.  She gets occasional cough with clear sputum.  She will hear herself wheezing at times also.  She gets frequent allergies, especially in the Spring and Fall.  She notices swelling in her neck glands when her allergies flare up.  She uses claritin as needed, and this helps.  She recently started using symbicort after she got insurance coverage, but this does not help fully.  She does not have an albuterol inhaler.  She started smoking at age 5.  She smoked 1 to 2 packs per day, and quit in October 2012.  She is from [place], and denies any recent travel.  There is no history of pneumonia.   She denies work related exposures.  She tested positive for TB with skin test at age 63, and remembers taking medicine for a year then.  She does not recall which medicine she took.  She has not had recent chest xray or PFT.  She has pet dogs and cats.   She has noticed trouble with her sleep for some time.  She snores, and will has been told she stops breathing while asleep.    She gets off work at [employer] pm.  She goes to bed at 2 am.  She has been using [place] recently, and this helps some.  She used to take klonopin.  She is awake every hour.  She uses the bathroom once during the night.  She gets out of bed at 10 am.  She feels tired all the time.  She used to walk in her sleep, and occasional talks in her sleep.  She will occasionally drink coffee or soda to help stay awake.  The patient denies bruxism, or nightmares.  There is no history of restless legs.  The patient denies sleep hallucinations, sleep paralysis, or cataplexy.  Her Epworth score is 4 out of 24.  Tests:  Past Medical  History  Diagnosis Date  . High cholesterol   . Depression   . Allergy     seasonal allergies  . Arthritis   . Hypertension     Past Surgical History  Procedure Date  . Arthroscopic knee   . Cesarean section   . Ganglion cyst excision   . Endometrial ablation   . Tubal ligation     Current Outpatient Prescriptions on File Prior to Visit  Medication Sig Dispense Refill  . ezetimibe-simvastatin (VYTORIN) 10-80 MG per tablet Take 1 tablet by mouth at bedtime. 1 BY MOUTH EVERYDAY  30 tablet  2  . loratadine (CLARITIN) 10 MG tablet Take 10 mg by mouth daily. 1 DAILY        . Nutritional Supplements (ESTROVEN) TABS Take 1 tablet by mouth daily.      Marland Kitchen PARoxetine (PAXIL) 10 MG tablet Once a day      . zolpidem (AMBIEN) 10 MG tablet Once at bedtime        Allergies  Allergen Reactions  . Codeine Nausea Only    Family History  Problem Relation Age of Onset  . Colon polyps Mother   . Diabetes Mother   . Hypertension Mother   . Heart disease Mother   .  Hyperlipidemia Mother   . Alcohol abuse Brother   . Hepatitis Brother     hep c  . Breast cancer Maternal Grandmother     breast  . Lung cancer Father     History  Substance Use Topics  . Smoking status: Former Smoker -- 0.5 packs/day for 20 years    Types: Cigarettes    Quit date: 10/08/2011  . Smokeless tobacco: Never Used  . Alcohol Use: 0.5 oz/week    1 drink(s) per week     Comment: 1 drink per week    Review of Systems  Constitutional: Positive for unexpected weight change. Negative for appetite change.  HENT: Positive for dental problem. Negative for ear pain, congestion, sore throat, sneezing and trouble swallowing.   Respiratory: Positive for shortness of breath. Negative for cough.   Cardiovascular: Negative for chest pain, palpitations and leg swelling.  Gastrointestinal: Negative for abdominal pain.  Musculoskeletal: Positive for arthralgias. Negative for joint swelling.  Skin: Negative for rash.    Neurological: Negative for headaches.  Psychiatric/Behavioral: The patient is not nervous/anxious.    Physical Exam: Filed Vitals:   01/08/13 1004  BP: 114/80  Pulse: 80  Temp: 97.1 F (36.2 C)  TempSrc: Oral  Height: 5' 1.5" (1.562 m)  Weight: 194 lb 12.8 oz (88.361 kg)  SpO2: 99%    Current Encounter SPO2  01/08/13 1004 99%  02/08/12 0953 94%  03/19/11 1651 97%    Wt Readings from Last 3 Encounters:  01/08/13 194 lb 12.8 oz (88.361 kg)  02/08/12 190 lb 3.2 oz (86.274 kg)  03/19/11 185 lb (83.915 kg)    Body mass index is 36.21 kg/(m^2).   General - No distress ENT - No sinus tenderness, no oral exudate, no LAN, no thyromegaly, TM clear, pupils equal/reactive Cardiac - s1s2 regular, no murmur, pulses symmetric Chest - No wheeze/rales/dullness, good air entry, normal respiratory excursion Back - No focal tenderness Abd - Soft, non-tender, no organomegaly, + bowel sounds Ext - No edema Neuro - Normal strength, cranial nerves intact Skin - No rashes Psych - Normal mood, and behavior.   Lab Results  Component Value Date   WBC 8.5 02/08/2012   HGB 14.2 02/08/2012   HCT 42.4 02/08/2012   MCV 88.8 02/08/2012   PLT 253.0 02/08/2012    Lab Results  Component Value Date   CREATININE 0.6 02/08/2012   BUN 13 02/08/2012   NA 136 02/08/2012   K 4.1 02/08/2012   CL 100 02/08/2012   CO2 28 02/08/2012    Lab Results  Component Value Date   ALT 31 02/08/2012   AST 22 02/08/2012   ALKPHOS 76 02/08/2012   BILITOT 0.5 02/08/2012    Lab Results  Component Value Date   TSH 0.91 02/08/2012    Assessment/Plan:  Coralyn Helling, MD [facility]/Sleep Pager:  [phone] 01/08/2013, 10:07 AM

## 2013-01-08 NOTE — Assessment & Plan Note (Signed)
She reports productive cough, dyspnea with exertion, and has history of smoking.  I am concerned she could have COPD.  To further assess will arrange for chest xray and PFT.  She is to continue symbicort, but I have advised her to use this twice per day.  I have also advised her to rinse her mouth after using symbicort.  Will arrange for albuterol inhaler.

## 2013-01-08 NOTE — Patient Instructions (Signed)
Chest xray today Will schedule PFT (breathing test) Will schedule sleep study Follow up in 3 weeks

## 2013-01-08 NOTE — Progress Notes (Deleted)
  Subjective:    Patient ID: Robin Small, female    DOB: 1961-04-26, 52 y.o.   MRN: [id number]  HPI    Review of Systems  Constitutional: Positive for unexpected weight change. Negative for appetite change.  HENT: Positive for dental problem. Negative for ear pain, congestion, sore throat, sneezing and trouble swallowing.   Respiratory: Positive for shortness of breath. Negative for cough.   Cardiovascular: Negative for chest pain, palpitations and leg swelling.  Gastrointestinal: Negative for abdominal pain.  Musculoskeletal: Positive for arthralgias. Negative for joint swelling.  Skin: Negative for rash.  Neurological: Negative for headaches.  Psychiatric/Behavioral: The patient is not nervous/anxious.        Objective:   Physical Exam        Assessment & Plan:

## 2013-01-09 ENCOUNTER — Telehealth: Payer: Self-pay | Admitting: *Deleted

## 2013-01-09 NOTE — Telephone Encounter (Signed)
Spoke with patient and I made her aware we have not written this medication in over a year, have not see her as well and she will need an OV, she said she would have one of her other doctor's write it.      KP

## 2013-01-09 NOTE — Telephone Encounter (Signed)
Patient called triage, insurance requires PA for klonopin.

## 2013-01-12 ENCOUNTER — Telehealth: Payer: Self-pay | Admitting: Pulmonary Disease

## 2013-01-12 ENCOUNTER — Encounter: Payer: Self-pay | Admitting: Pulmonary Disease

## 2013-01-12 NOTE — Telephone Encounter (Signed)
   01/08/2013  *RADIOLOGY REPORT*   Clinical Data: Cough, shortness of breath.  Prior smoker.   CHEST - 2 VIEW   Comparison: None   Findings: Heart and mediastinal contours are within normal limits. No focal opacities or effusions.  No acute bony abnormality.   IMPRESSION: No active cardiopulmonary disease.    Original Report Authenticated By: Charlett Nose, M.D.     Will have my nurse inform patient that CXR was normal.  No change to current tx plan.

## 2013-01-14 NOTE — Telephone Encounter (Signed)
lmomtcb x1 

## 2013-01-15 NOTE — Telephone Encounter (Signed)
lmomtcb x 2  

## 2013-01-16 ENCOUNTER — Encounter: Payer: Self-pay | Admitting: *Deleted

## 2013-01-16 NOTE — Telephone Encounter (Signed)
lmomtcb x3 will send letter out to pt advising her to return our call

## 2013-01-19 ENCOUNTER — Telehealth: Payer: Self-pay | Admitting: Pulmonary Disease

## 2013-01-19 NOTE — Telephone Encounter (Signed)
Will have my nurse inform patient that CXR was normal. No change to current tx plan ----  I spoke with patient about results and she verbalized understanding and had no questions

## 2013-01-29 ENCOUNTER — Encounter (HOSPITAL_BASED_OUTPATIENT_CLINIC_OR_DEPARTMENT_OTHER): Payer: BC Managed Care – PPO

## 2013-02-12 ENCOUNTER — Ambulatory Visit: Payer: BC Managed Care – PPO | Admitting: Pulmonary Disease

## 2013-02-19 ENCOUNTER — Ambulatory Visit (HOSPITAL_BASED_OUTPATIENT_CLINIC_OR_DEPARTMENT_OTHER): Payer: BC Managed Care – PPO | Attending: Pulmonary Disease

## 2013-02-19 VITALS — Ht 61.0 in | Wt 145.0 lb

## 2013-02-19 DIAGNOSIS — I10 Essential (primary) hypertension: Secondary | ICD-10-CM | POA: Insufficient documentation

## 2013-02-19 DIAGNOSIS — J4489 Other specified chronic obstructive pulmonary disease: Secondary | ICD-10-CM | POA: Insufficient documentation

## 2013-02-19 DIAGNOSIS — G4733 Obstructive sleep apnea (adult) (pediatric): Secondary | ICD-10-CM

## 2013-02-27 ENCOUNTER — Telehealth: Payer: Self-pay | Admitting: Pulmonary Disease

## 2013-02-27 DIAGNOSIS — G4733 Obstructive sleep apnea (adult) (pediatric): Secondary | ICD-10-CM

## 2013-02-27 NOTE — Telephone Encounter (Signed)
PSG 02/19/13 >> AHI 57.8, SpO2 low 63%; CPAP 10 cm H2O >> AHI 5.7, +R, +S; PLMI 64.2>>0.  Left message detailing sleep study results, and plan to start CPAP 10 cm H2O.  Will then d/w pt at [facility] on 03/12/13.  Advised she could call back sooner if she has questions about study results.

## 2013-02-28 NOTE — Procedures (Signed)
NAMESABRINIA, Robin Small                ACCOUNT NO.:  [id number]  MEDICAL RECORD NO.:  [id number]          PATIENT TYPE:  OUT  LOCATION:  SLEEP CENTER                 FACILITY[facility]  [facility]  PHYSICIAN:  Coralyn Helling, MD        DATE OF BIRTH:  February 18, 1961  DATE OF STUDY:  02/19/2013                           NOCTURNAL POLYSOMNOGRAM  REFERRING PHYSICIAN:  Coralyn Helling, MD  INDICATION FOR STUDY:  [name] is a 52 year old female who has a history of depression, hypertension, and COPD.  She also reports snoring, sleep disruption, and daytime sleepiness.  She is referred to Sleep Lab for evaluation of hypersomnia with obstructive sleep apnea.  Height is 5 feet 1 inches, weight is 195 pounds, BMI is 37, neck size is 15 inches.  EPWORTH SLEEPINESS SCORE:  6.  MEDICATIONS:  Vitamins and Ambien.  SLEEP ARCHITECTURE:  The patient followed a split night study protocol. During the diagnostic portion of the study, total recording time was 128 minutes, total sleep time was 221 minutes.  Sleep efficiency was 95%. Sleep latency was 5.5 minutes.  This portion of the study was notable for lack of REM sleep and she slept in both the supine and nonsupine positions.  During the titration portion of the study, total recording time was 240 minutes, total sleep time was 220 minutes.  Sleep efficiency was 91%. Sleep latency was 1 minute.  REM latency was 125 minutes.  This portion of the study was notable for a reduction in the percentage of slow-wave sleep and she slept predominantly in the supine position.  RESPIRATORY DATA:  The average respiratory rate was 16.  The overall apnea-hypopnea index was 57.8.  The events were exclusively obstructive in nature.  During the titration portion of the study, the patient was started on CPAP of 4 and increased to 11 cm of water.  With CPAP at 10 cm of water, the apnea-hypopnea index was reduced to 5.7.  At this pressure setting, she was observed in REM sleep and  supine sleep.  She was fitted with a medium-sized ResMed Mirage face mask.  OXYGEN DATA:  The baseline oxygenation was 97%.  The oxygen saturation nadir was 62%.  The patient had good control of her oxygenation with CPAP at 11 cm of water.  The study was conducted without the use of supplemental oxygen.  MOVEMENT-PARASOMNIA:  The periodic limb movement index was 64.2 during the diagnostic portion of the study and 0 during the therapeutic portion of the study.  The patient had 2 restroom trips.  IMPRESSIONS-RECOMMENDATIONS:  This study shows evidence for severe obstructive sleep apnea with an apnea-hypopnea index of 57.8 and oxygen saturation nadir of 63%.  She did very well with CPAP at 10 cm of water.  In addition to diet, exercise, and weight reduction, I would recommend that the patient be started on CPAP at 10 cm of water and monitored for her clinical response.     Coralyn Helling, MD Diplomat, American Board of Sleep Medicine    VS/MEDQ  D:  02/27/2013 18:07:45  T:  02/28/2013 02:55:48  Job:  [id number]

## 2013-03-12 ENCOUNTER — Encounter: Payer: Self-pay | Admitting: Pulmonary Disease

## 2013-03-12 ENCOUNTER — Ambulatory Visit (INDEPENDENT_AMBULATORY_CARE_PROVIDER_SITE_OTHER): Payer: BC Managed Care – PPO | Admitting: Pulmonary Disease

## 2013-03-12 VITALS — BP 100/60 | HR 74 | Temp 98.1°F | Ht 62.0 in | Wt 197.0 lb

## 2013-03-12 DIAGNOSIS — J4520 Mild intermittent asthma, uncomplicated: Secondary | ICD-10-CM

## 2013-03-12 DIAGNOSIS — G4733 Obstructive sleep apnea (adult) (pediatric): Secondary | ICD-10-CM

## 2013-03-12 DIAGNOSIS — R0600 Dyspnea, unspecified: Secondary | ICD-10-CM

## 2013-03-12 DIAGNOSIS — R0609 Other forms of dyspnea: Secondary | ICD-10-CM

## 2013-03-12 DIAGNOSIS — J45909 Unspecified asthma, uncomplicated: Secondary | ICD-10-CM

## 2013-03-12 MED ORDER — ALBUTEROL SULFATE HFA 108 (90 BASE) MCG/ACT IN AERS: 2.0000 | INHALATION_SPRAY | Freq: Four times a day (QID) | RESPIRATORY_TRACT | Status: DC | PRN

## 2013-03-12 MED ORDER — ZOLPIDEM TARTRATE 10 MG PO TABS: 10.0000 mg | ORAL_TABLET | Freq: Every evening | ORAL | Status: DC | PRN

## 2013-03-12 NOTE — Assessment & Plan Note (Signed)
She has severe sleep apnea.  I have reviewed her sleep test results with the patient.  Explained how sleep apnea can affect the patient's health.  Driving precautions and importance of weight loss were discussed.  Treatment options for sleep apnea were reviewed.  Will change to auto CPAP settings.

## 2013-03-12 NOTE — Progress Notes (Signed)
Chief Complaint  Patient presents with  . Follow-up    PFTs today-- started CPAP x1 wk-- states she awakens herself at night w snoring-- sometimes feels settings are comfortable and sometimes they feel "weak"--- feels like symbicort may not be working for her off x1 month, also leaves a "bad taste" in her mouth  . Medication Refill    would like Dr. Craige Cotta to fill Ambien    History of Present Illness: Robin Small is a 52 y.o. female former smoker with OSA and dyspnea.  She is here to review sleep study and PFT.  She has been started on CPAP.  She is no longer snoring.  She still wakes up and feels like she is snoring.  She sometimes feels like she is not getting enough pressure from her CPAP.  She has full face mask.  She still gets winded at times.  She stop symbicort >> this didn't seem to help.  She still uses albuterol sporadically, and this helps.  She has to use this more when she has allergy troubles.  TESTS: PSG 02/19/13 >> AHI 57.8, SpO2 low 63%; CPAP 10 cm H2O >> AHI 5.7, +R, +S; PLMI 64.2>>0. PFT 03/12/13 >> FEV1 2.77 (119%), FEV1% 77, TLC 5.35 (117%), DLCO 86%, no BD  Robin Small  has a past medical history of High cholesterol; Depression; Allergy; Arthritis; Hypertension; and PPD positive.  Robin Small  has past surgical history that includes ARTHROSCOPIC KNEE; Cesarean section; Ganglion cyst excision; Endometrial ablation; and Tubal ligation.  Prior to Admission medications   Medication Sig Start Date End Date Taking? Authorizing Provider  albuterol (PROAIR HFA) 108 (90 BASE) MCG/ACT inhaler Inhale 2 puffs into the lungs every 6 (six) hours as needed for wheezing. 01/08/13  Yes Coralyn Helling, MD  ESTRACE VAGINAL 0.1 MG/GM vaginal cream Use as directed 01/01/13  Yes Historical Provider, MD  ezetimibe-simvastatin (VYTORIN) 10-80 MG per tablet Take 1 tablet by mouth at bedtime. 1 BY MOUTH EVERYDAY 03/03/12  Yes Yvonne R Lowne, DO  loratadine (CLARITIN) 10 MG tablet Take 10 mg  by mouth daily. 1 DAILY     Yes Historical Provider, MD  Nutritional Supplements (ESTROVEN) TABS Take 1 tablet by mouth daily.   Yes Historical Provider, MD  PARoxetine (PAXIL) 10 MG tablet Once a day 01/01/13  Yes Historical Provider, MD  zolpidem (AMBIEN) 10 MG tablet Once at bedtime 01/01/13  Yes Historical Provider, MD  SYMBICORT 160-4.5 MCG/ACT inhaler Inhale 2 puffs into the lungs 2 (two) times daily. 2 puffs once a day 12/25/12   Historical Provider, MD    Allergies  Allergen Reactions  . Codeine Nausea Only     Physical Exam:  General - No distress ENT - No sinus tenderness, no oral exudate, no LAN Cardiac - s1s2 regular, no murmur Chest - No wheeze/rales/dullness Back - No focal tenderness Abd - Soft, non-tender Ext - No edema Neuro - Normal strength Skin - No rashes Psych - normal mood, and behavior  Dg Chest 2 View  01/08/2013  *RADIOLOGY REPORT*  Clinical Data: Cough, shortness of breath.  Prior smoker.  CHEST - 2 VIEW  Comparison: None  Findings: Heart and mediastinal contours are within normal limits. No focal opacities or effusions.  No acute bony abnormality.  IMPRESSION: No active cardiopulmonary disease.   Original Report Authenticated By: Charlett Nose, M.D.      Assessment/Plan:  Coralyn Helling, MD [facility]/Sleep Pager:  [phone] 03/12/2013, 4:03 PM

## 2013-03-12 NOTE — Progress Notes (Signed)
PFT done today. 

## 2013-03-12 NOTE — Assessment & Plan Note (Signed)
Pulmonary evaluation unremarkable except for possible mild asthma.  Advised her to d/w her PCP if dyspnea persists, and whether she may need cardiac evaluation.  She likely also has component of deconditioning.

## 2013-03-12 NOTE — Patient Instructions (Signed)
Follow up in 2 months

## 2013-03-12 NOTE — Assessment & Plan Note (Signed)
Continue prn albuterol

## 2013-04-03 ENCOUNTER — Encounter: Payer: Self-pay | Admitting: Family Medicine

## 2013-04-15 ENCOUNTER — Telehealth: Payer: Self-pay | Admitting: Family Medicine

## 2013-04-15 MED ORDER — EZETIMIBE-SIMVASTATIN 10-80 MG PO TABS: 1.0000 | ORAL_TABLET | Freq: Every day | ORAL | Status: DC

## 2013-04-15 NOTE — Telephone Encounter (Signed)
ezetimibe-simvastatin (VYTORIN) 10-80 MG per tablet

## 2013-04-20 ENCOUNTER — Ambulatory Visit (INDEPENDENT_AMBULATORY_CARE_PROVIDER_SITE_OTHER): Payer: BC Managed Care – PPO | Admitting: Family Medicine

## 2013-04-20 ENCOUNTER — Encounter: Payer: Self-pay | Admitting: Family Medicine

## 2013-04-20 DIAGNOSIS — F341 Dysthymic disorder: Secondary | ICD-10-CM

## 2013-04-20 DIAGNOSIS — F411 Generalized anxiety disorder: Secondary | ICD-10-CM

## 2013-04-20 DIAGNOSIS — E785 Hyperlipidemia, unspecified: Secondary | ICD-10-CM

## 2013-04-20 DIAGNOSIS — G4733 Obstructive sleep apnea (adult) (pediatric): Secondary | ICD-10-CM

## 2013-04-20 DIAGNOSIS — G4700 Insomnia, unspecified: Secondary | ICD-10-CM

## 2013-04-20 LAB — HEMOGLOBIN A1C: Hgb A1c MFr Bld: 6.8 % — ABNORMAL HIGH (ref 4.6–6.5)

## 2013-04-20 MED ORDER — EZETIMIBE-SIMVASTATIN 10-80 MG PO TABS: 1.0000 | ORAL_TABLET | Freq: Every day | ORAL | Status: DC

## 2013-04-20 MED ORDER — CLONAZEPAM 0.5 MG PO TABS: 0.5000 mg | ORAL_TABLET | Freq: Three times a day (TID) | ORAL | Status: DC | PRN

## 2013-04-20 NOTE — Assessment & Plan Note (Signed)
Refill klonopin

## 2013-04-20 NOTE — Progress Notes (Signed)
  Subjective:    Patient ID: Robin Small, female    DOB: 1961/04/17, 52 y.o.   MRN: [id number]  HPI Pt here to f/u anxiety and hyperlipidemia-- she is not fasting.  She also c/o mid abd weight gain.   cxr and US done by pulm and gyn respectively.  Pt admits to drinking sugar drinks but otherwise she feels like she eats healthy.  No other complaints.    Review of Systems As above    Objective:   Physical Exam   BP 100/62  Pulse 85  Temp(Src) 98.7 F (37.1 C) (Oral)  Wt 200 lb 3.2 oz (90.81 kg)  BMI 36.61 kg/m2  SpO2 97% General appearance: alert, cooperative, appears stated age and no distress Lungs: clear to auscultation bilaterally Heart: S1, S2 normal Abdomen: abnormal findings:  distended--soft , NT Extremities: extremities normal, atraumatic, no cyanosis or edema     Assessment & Plan:

## 2013-04-20 NOTE — Assessment & Plan Note (Signed)
rto for cpe and labs Refill meds

## 2013-04-20 NOTE — Patient Instructions (Addendum)

## 2013-04-20 NOTE — Assessment & Plan Note (Signed)
Per pulm 

## 2013-04-21 LAB — BASIC METABOLIC PANEL
BUN: 12 mg/dL (ref 6–23)
Calcium: 9.4 mg/dL (ref 8.4–10.5)
GFR: 114.54 mL/min (ref >60.00)
Glucose, Bld: 90 mg/dL (ref 70–99)
Potassium: 4 mEq/L (ref 3.5–5.1)
Sodium: 139 mEq/L (ref 135–145)

## 2013-04-27 ENCOUNTER — Ambulatory Visit: Payer: BC Managed Care – PPO | Admitting: Pulmonary Disease

## 2013-05-17 ENCOUNTER — Other Ambulatory Visit: Payer: Self-pay | Admitting: Family Medicine

## 2013-06-15 ENCOUNTER — Encounter: Payer: BC Managed Care – PPO | Admitting: Family Medicine

## 2013-07-22 ENCOUNTER — Telehealth: Payer: Self-pay | Admitting: Family Medicine

## 2013-07-22 DIAGNOSIS — F411 Generalized anxiety disorder: Secondary | ICD-10-CM

## 2013-07-22 MED ORDER — PAROXETINE HCL 10 MG PO TABS: 10.0000 mg | ORAL_TABLET | ORAL | Status: DC

## 2013-07-22 MED ORDER — EZETIMIBE-SIMVASTATIN 10-80 MG PO TABS: ORAL_TABLET | ORAL | Status: DC

## 2013-07-22 MED ORDER — ALBUTEROL SULFATE HFA 108 (90 BASE) MCG/ACT IN AERS: 2.0000 | INHALATION_SPRAY | Freq: Four times a day (QID) | RESPIRATORY_TRACT | Status: DC | PRN

## 2013-07-22 NOTE — Telephone Encounter (Signed)
     Clonazepam requested for a 90 day supply to be sent to mail order. Rx was sent on 04/20/13 #60 with 2 refills to the local pharmacy. Please advise KP   Request for Klonopin sent to Provider for review. I will call patient when the Rx has been sent. Please advise      KP

## 2013-07-22 NOTE — Telephone Encounter (Signed)
Clonazepam requested for a 90 day supply to be sent to mail order. Rx was sent on 04/20/13 #60 with 2 refills to the local pharmacy. Please advise       KP

## 2013-07-22 NOTE — Telephone Encounter (Signed)
I can refill all medicines for one month; this would prevent the potential risk of meds being changed based on the findings @ her physical 08/24/13. Another option would be to give some samples of Vytorin until the physical.  If she insists on 90 days by mail order; this can be addressed by Dr. Laury Axon 8/7.

## 2013-07-22 NOTE — Telephone Encounter (Signed)
Patient called requesting rx for vytorin, clonazepam, paroxetine, and albuterol. She is requesting 90 day supply from [facility]. Pt has CPE scheduled for 08/24/13.

## 2013-07-23 MED ORDER — CLONAZEPAM 0.5 MG PO TABS: 0.5000 mg | ORAL_TABLET | Freq: Three times a day (TID) | ORAL | Status: DC | PRN

## 2013-07-23 NOTE — Telephone Encounter (Signed)
RX faxed to [facility] per request.      KP

## 2013-07-23 NOTE — Telephone Encounter (Signed)
Ok to send 90 day  

## 2013-08-21 ENCOUNTER — Telehealth: Payer: Self-pay | Admitting: Pulmonary Disease

## 2013-08-21 ENCOUNTER — Encounter: Payer: Self-pay | Admitting: Lab

## 2013-08-21 MED ORDER — ZOLPIDEM TARTRATE 10 MG PO TABS
10.0000 mg | ORAL_TABLET | Freq: Every evening | ORAL | Status: DC | PRN
Start: 2013-08-21 — End: 2013-08-24

## 2013-08-21 NOTE — Telephone Encounter (Signed)
Okay to refill? 

## 2013-08-21 NOTE — Telephone Encounter (Signed)
We received a fax from [facility] for a refill on Ambien. This has not been filled by [place] since 01/2013. Would you like to refill this?  Last OV 03/02/2013

## 2013-08-21 NOTE — Telephone Encounter (Signed)
Rx has been printed for VS to sign. Once it's signed I will fax to [facility] per their request.

## 2013-08-24 ENCOUNTER — Ambulatory Visit (INDEPENDENT_AMBULATORY_CARE_PROVIDER_SITE_OTHER): Payer: BC Managed Care – PPO | Admitting: Family Medicine

## 2013-08-24 ENCOUNTER — Other Ambulatory Visit: Payer: Self-pay

## 2013-08-24 ENCOUNTER — Encounter: Payer: Self-pay | Admitting: Family Medicine

## 2013-08-24 VITALS — BP 90/59 | HR 63 | Temp 98.2°F | Ht 63.0 in | Wt 190.4 lb

## 2013-08-24 DIAGNOSIS — Z23 Encounter for immunization: Secondary | ICD-10-CM

## 2013-08-24 DIAGNOSIS — R238 Other skin changes: Secondary | ICD-10-CM | POA: Insufficient documentation

## 2013-08-24 DIAGNOSIS — E785 Hyperlipidemia, unspecified: Secondary | ICD-10-CM

## 2013-08-24 DIAGNOSIS — Z0000 Encounter for general adult medical examination without abnormal findings: Secondary | ICD-10-CM

## 2013-08-24 DIAGNOSIS — F411 Generalized anxiety disorder: Secondary | ICD-10-CM

## 2013-08-24 DIAGNOSIS — J4520 Mild intermittent asthma, uncomplicated: Secondary | ICD-10-CM

## 2013-08-24 DIAGNOSIS — J45909 Unspecified asthma, uncomplicated: Secondary | ICD-10-CM

## 2013-08-24 DIAGNOSIS — G4700 Insomnia, unspecified: Secondary | ICD-10-CM

## 2013-08-24 DIAGNOSIS — R21 Rash and other nonspecific skin eruption: Secondary | ICD-10-CM

## 2013-08-24 DIAGNOSIS — J019 Acute sinusitis, unspecified: Secondary | ICD-10-CM | POA: Insufficient documentation

## 2013-08-24 LAB — POCT URINALYSIS DIPSTICK
Blood, UA: NEGATIVE
Ketones, UA: NEGATIVE
Protein, UA: NEGATIVE
Spec Grav, UA: 1.02
Urobilinogen, UA: 0.2

## 2013-08-24 LAB — BASIC METABOLIC PANEL
CO2: 27 mEq/L (ref 19–32)
Chloride: 106 mEq/L (ref 96–112)
Potassium: 3.9 mEq/L (ref 3.5–5.1)
Sodium: 139 mEq/L (ref 135–145)

## 2013-08-24 LAB — CBC WITH DIFFERENTIAL/PLATELET
Eosinophils Absolute: 0.1 10*3/uL (ref 0.0–0.7)
Eosinophils Relative: 1.7 % (ref 0.0–5.0)
Lymphocytes Relative: 35.6 % (ref 12.0–46.0)
MCV: 86 fl (ref 78.0–100.0)
Monocytes Absolute: 0.5 10*3/uL (ref 0.1–1.0)
Neutrophils Relative %: 53.8 % (ref 43.0–77.0)
Platelets: 231 10*3/uL (ref 150.0–400.0)
RBC: 4.67 Mil/uL (ref 3.87–5.11)
WBC: 6.9 10*3/uL (ref 4.5–10.5)

## 2013-08-24 LAB — HEPATIC FUNCTION PANEL
ALT: 34 U/L (ref 0–35)
AST: 23 U/L (ref 0–37)
Alkaline Phosphatase: 73 U/L (ref 39–117)
Bilirubin, Direct: 0.1 mg/dL (ref 0.0–0.3)
Total Bilirubin: 0.7 mg/dL (ref 0.3–1.2)
Total Protein: 7.4 g/dL (ref 6.0–8.3)

## 2013-08-24 LAB — LIPID PANEL
Cholesterol: 122 mg/dL (ref 0–200)
Total CHOL/HDL Ratio: 3
VLDL: 42.8 mg/dL — ABNORMAL HIGH (ref 0.0–40.0)

## 2013-08-24 LAB — LDL CHOLESTEROL, DIRECT: Direct LDL: 63.9 mg/dL

## 2013-08-24 MED ORDER — ZOLPIDEM TARTRATE 10 MG PO TABS: 10.0000 mg | ORAL_TABLET | Freq: Every evening | ORAL | Status: DC | PRN

## 2013-08-24 MED ORDER — CLONAZEPAM 0.5 MG PO TABS: 0.5000 mg | ORAL_TABLET | Freq: Three times a day (TID) | ORAL | Status: DC | PRN

## 2013-08-24 MED ORDER — PAROXETINE HCL 10 MG PO TABS: 10.0000 mg | ORAL_TABLET | ORAL | Status: DC

## 2013-08-24 MED ORDER — ALBUTEROL SULFATE HFA 108 (90 BASE) MCG/ACT IN AERS: 2.0000 | INHALATION_SPRAY | Freq: Four times a day (QID) | RESPIRATORY_TRACT | Status: DC | PRN

## 2013-08-24 MED ORDER — VALACYCLOVIR HCL 1 G PO TABS: ORAL_TABLET | ORAL | Status: DC

## 2013-08-24 MED ORDER — CEFUROXIME AXETIL 500 MG PO TABS: 500.0000 mg | ORAL_TABLET | Freq: Two times a day (BID) | ORAL | Status: AC

## 2013-08-24 NOTE — Assessment & Plan Note (Signed)
Cultures--- bacterial and viral done Valtrex for 10 days

## 2013-08-24 NOTE — Progress Notes (Signed)
Subjective:     Robin Small is a 52 y.o. female and is here for a comprehensive physical exam. The patient reports problems - reoccurring rash on R leg --annually, not painful or itchy.. Usually goes away with H2O2 ---  She left it alone this year because she was coming in.  Pt also c/o L sinuses and R ear pain--dentist though it was sinuses -- x few months. Pt also c/o muscle aches and is concerned about vytorin.     History   Social History  . Marital Status: Married    Spouse Name: N/A    Number of Children: N/A  . Years of Education: N/A   Occupational History  . internet support    Social History Main Topics  . Smoking status: Former Smoker -- 0.50 packs/day for 20 years    Types: Cigarettes    Quit date: 10/08/2011  . Smokeless tobacco: Never Used  . Alcohol Use: 0.5 oz/week    1 drink(s) per week     Comment: 1 drink per week  . Drug Use: No  . Sexual Activity: Yes    Partners: Male   Other Topics Concern  . Not on file   Social History Narrative   Exercise-- daily for 1 hour   Health Maintenance  Topic Date Due  . Influenza Vaccine  07/17/2013  . Mammogram  01/01/2014  . Tetanus/tdap  12/15/2014  . Pap Smear  01/02/2016  . Colonoscopy  03/18/2016    The following portions of the patient's history were reviewed and updated as appropriate:  She  has a past medical history of High cholesterol; Depression; Allergy; Arthritis; Hypertension; and PPD positive. She  does not have any pertinent problems on file. She  has past surgical history that includes ARTHROSCOPIC KNEE; Cesarean section; Ganglion cyst excision; Endometrial ablation; and Tubal ligation. Her family history includes Alcohol abuse in her brother; Breast cancer in her maternal grandmother; Colon polyps in her mother; Diabetes in her mother; Heart disease in her mother; Hepatitis in her brother; Hyperlipidemia in her mother; Hypertension in her mother; Lung cancer in her father. She  reports that  she quit smoking about 22 months ago. Her smoking use included Cigarettes. She has a 10 pack-year smoking history. She has never used smokeless tobacco. She reports that she drinks about 0.5 ounces of alcohol per week. She reports that she does not use illicit drugs. She has a current medication list which includes the following prescription(s): albuterol, clonazepam, loratadine, estroven, paroxetine, zolpidem, cefuroxime, estrace vaginal, and valacyclovir. Current Outpatient Prescriptions on File Prior to Visit  Medication Sig Dispense Refill  . loratadine (CLARITIN) 10 MG tablet Take 10 mg by mouth daily. 1 DAILY        . Nutritional Supplements (ESTROVEN) TABS Take 1 tablet by mouth daily.      Marland Kitchen ESTRACE VAGINAL 0.1 MG/GM vaginal cream Use as directed       No current facility-administered medications on file prior to visit.   She is allergic to codeine..  Review of Systems Review of Systems  Constitutional: Negative for activity change, appetite change and fatigue.  HENT: Negative for hearing loss, tinnitus and ear discharge.  dentist q83m----+ congestion and jaw pain Eyes: Negative for visual disturbance (see optho q1y -- vision corrected to 20/20 with glasses).  Respiratory: Negative for cough, chest tightness and shortness of breath.   Cardiovascular: Negative for chest pain, palpitations and leg swelling.  Gastrointestinal: Negative for abdominal pain, diarrhea, constipation and abdominal  distention.  Genitourinary: Negative for urgency, frequency, decreased urine volume and difficulty urinating.  Musculoskeletal: Negative for back pain, arthralgias and gait problem.  Skin: Negative for color change, pallor and rash.  Neurological: Negative for dizziness, light-headedness, numbness and headaches.  Hematological: Negative for adenopathy. Does not bruise/bleed easily.  Psychiatric/Behavioral: Negative for suicidal ideas, confusion, sleep disturbance, self-injury, dysphoric mood,  decreased concentration and agitation.       Objective:    BP 90/59  Pulse 63  Temp(Src) 98.2 F (36.8 C) (Oral)  Ht 5\' 3"  (1.6 m)  Wt 190 lb 6.4 oz (86.365 kg)  BMI 33.74 kg/m2  SpO2 95% General appearance: alert, cooperative, appears stated age and no distress Head: Normocephalic, without obvious abnormality, atraumatic Eyes: negative findings: lids and lashes normal, conjunctivae and sclerae normal and pupils equal, round, reactive to light and accomodation Ears: normal TM's and external ear canals both ears Nose: green discharge, moderate congestion, turbinates red, swollen, sinus tenderness right Throat: lips, mucosa, and tongue normal; teeth and gums normal Neck: no adenopathy, no carotid bruit, no JVD, supple, symmetrical, trachea midline and thyroid not enlarged, symmetric, no tenderness/mass/nodules Back: symmetric, no curvature. ROM normal. No CVA tenderness. Lungs: clear to auscultation bilaterally Breasts: gyn Heart: regular rate and rhythm, S1, S2 normal, no murmur, click, rub or gallop Abdomen: soft, non-tender; bowel sounds normal; no masses,  no organomegaly Pelvic: deferred Extremities: extremities normal, atraumatic, no cyanosis or edema Pulses: 2+ and symmetric Skin: vesicular cluster R thigh Lymph nodes: Cervical, supraclavicular, and axillary nodes normal. Neurologic: Alert and oriented X 3, normal strength and tone. Normal symmetric reflexes. Normal coordination and gait Psych-- no anxiety , no depression      Assessment:    Healthy female exam.     Plan:    check labs ghm utd See After Visit Summary for Counseling Recommendations

## 2013-08-24 NOTE — Assessment & Plan Note (Signed)
abx per orders  

## 2013-08-24 NOTE — Assessment & Plan Note (Signed)
Prn albuterol ?

## 2013-08-24 NOTE — Patient Instructions (Addendum)
Preventive Care for Adults, Female A healthy lifestyle and preventive care can promote health and wellness. Preventive health guidelines for women include the following key practices.  A routine yearly physical is a good way to check with your caregiver about your health and preventive screening. It is a chance to share any concerns and updates on your health, and to receive a thorough exam.  Visit your dentist for a routine exam and preventive care every 6 months. Brush your teeth twice a day and floss once a day. Good oral hygiene prevents tooth decay and gum disease.  The frequency of eye exams is based on your age, health, family medical history, use of contact lenses, and other factors. Follow your caregiver's recommendations for frequency of eye exams.  Eat a healthy diet. Foods like vegetables, fruits, whole grains, low-fat dairy products, and lean protein foods contain the nutrients you need without too many calories. Decrease your intake of foods high in solid fats, added sugars, and salt. Eat the right amount of calories for you.Get information about a proper diet from your caregiver, if necessary.  Regular physical exercise is one of the most important things you can do for your health. Most adults should get at least 150 minutes of moderate-intensity exercise (any activity that increases your heart rate and causes you to sweat) each week. In addition, most adults need muscle-strengthening exercises on 2 or more days a week.  Maintain a healthy weight. The body mass index (BMI) is a screening tool to identify possible weight problems. It provides an estimate of body fat based on height and weight. Your caregiver can help determine your BMI, and can help you achieve or maintain a healthy weight.For adults 20 years and older:  A BMI below 18.5 is considered underweight.  A BMI of 18.5 to 24.9 is normal.  A BMI of 25 to 29.9 is considered overweight.  A BMI of 30 and above is  considered obese.  Maintain normal blood lipids and cholesterol levels by exercising and minimizing your intake of saturated fat. Eat a balanced diet with plenty of fruit and vegetables. Blood tests for lipids and cholesterol should begin at age 20 and be repeated every 5 years. If your lipid or cholesterol levels are high, you are over 50, or you are at high risk for heart disease, you may need your cholesterol levels checked more frequently.Ongoing high lipid and cholesterol levels should be treated with medicines if diet and exercise are not effective.  If you smoke, find out from your caregiver how to quit. If you do not use tobacco, do not start.  If you are pregnant, do not drink alcohol. If you are breastfeeding, be very cautious about drinking alcohol. If you are not pregnant and choose to drink alcohol, do not exceed 1 drink per day. One drink is considered to be 12 ounces (355 mL) of beer, 5 ounces (148 mL) of wine, or 1.5 ounces (44 mL) of liquor.  Avoid use of street drugs. Do not share needles with anyone. Ask for help if you need support or instructions about stopping the use of drugs.  High blood pressure causes heart disease and increases the risk of stroke. Your blood pressure should be checked at least every 1 to 2 years. Ongoing high blood pressure should be treated with medicines if weight loss and exercise are not effective.  If you are 55 to 50 years old, ask your caregiver if you should take aspirin to prevent strokes.  Diabetes   screening involves taking a blood sample to check your fasting blood sugar level. This should be done once every 3 years, after age 45, if you are within normal weight and without risk factors for diabetes. Testing should be considered at a younger age or be carried out more frequently if you are overweight and have at least 1 risk factor for diabetes.  Breast cancer screening is essential preventive care for women. You should practice "breast  self-awareness." This means understanding the normal appearance and feel of your breasts and may include breast self-examination. Any changes detected, no matter how small, should be reported to a caregiver. Women in their 20s and 30s should have a clinical breast exam (CBE) by a caregiver as part of a regular health exam every 1 to 3 years. After age 40, women should have a CBE every year. Starting at age 40, women should consider having a mammography (breast X-ray test) every year. Women who have a family history of breast cancer should talk to their caregiver about genetic screening. Women at a high risk of breast cancer should talk to their caregivers about having magnetic resonance imaging (MRI) and a mammography every year.  The Pap test is a screening test for cervical cancer. A Pap test can show cell changes on the cervix that might become cervical cancer if left untreated. A Pap test is a procedure in which cells are obtained and examined from the lower end of the uterus (cervix).  Women should have a Pap test starting at age 21.  Between ages 21 and 29, Pap tests should be repeated every 2 years.  Beginning at age 30, you should have a Pap test every 3 years as long as the past 3 Pap tests have been normal.  Some women have medical problems that increase the chance of getting cervical cancer. Talk to your caregiver about these problems. It is especially important to talk to your caregiver if a new problem develops soon after your last Pap test. In these cases, your caregiver may recommend more frequent screening and Pap tests.  The above recommendations are the same for women who have or have not gotten the vaccine for human papillomavirus (HPV).  If you had a hysterectomy for a problem that was not cancer or a condition that could lead to cancer, then you no longer need Pap tests. Even if you no longer need a Pap test, a regular exam is a good idea to make sure no other problems are  starting.  If you are between ages 65 and 70, and you have had normal Pap tests going back 10 years, you no longer need Pap tests. Even if you no longer need a Pap test, a regular exam is a good idea to make sure no other problems are starting.  If you have had past treatment for cervical cancer or a condition that could lead to cancer, you need Pap tests and screening for cancer for at least 20 years after your treatment.  If Pap tests have been discontinued, risk factors (such as a new sexual partner) need to be reassessed to determine if screening should be resumed.  The HPV test is an additional test that may be used for cervical cancer screening. The HPV test looks for the virus that can cause the cell changes on the cervix. The cells collected during the Pap test can be tested for HPV. The HPV test could be used to screen women aged 30 years and older, and should   be used in women of any age who have unclear Pap test results. After the age of 30, women should have HPV testing at the same frequency as a Pap test.  Colorectal cancer can be detected and often prevented. Most routine colorectal cancer screening begins at the age of 50 and continues through age 75. However, your caregiver may recommend screening at an earlier age if you have risk factors for colon cancer. On a yearly basis, your caregiver may provide home test kits to check for hidden blood in the stool. Use of a small camera at the end of a tube, to directly examine the colon (sigmoidoscopy or colonoscopy), can detect the earliest forms of colorectal cancer. Talk to your caregiver about this at age 50, when routine screening begins. Direct examination of the colon should be repeated every 5 to 10 years through age 75, unless early forms of pre-cancerous polyps or small growths are found.  Hepatitis C blood testing is recommended for all people born from 1945 through 1965 and any individual with known risks for hepatitis C.  Practice  safe sex. Use condoms and avoid high-risk sexual practices to reduce the spread of sexually transmitted infections (STIs). STIs include gonorrhea, chlamydia, syphilis, trichomonas, herpes, HPV, and human immunodeficiency virus (HIV). Herpes, HIV, and HPV are viral illnesses that have no cure. They can result in disability, cancer, and death. Sexually active women aged 25 and younger should be checked for chlamydia. Older women with new or multiple partners should also be tested for chlamydia. Testing for other STIs is recommended if you are sexually active and at increased risk.  Osteoporosis is a disease in which the bones lose minerals and strength with aging. This can result in serious bone fractures. The risk of osteoporosis can be identified using a bone density scan. Women ages 65 and over and women at risk for fractures or osteoporosis should discuss screening with their caregivers. Ask your caregiver whether you should take a calcium supplement or vitamin D to reduce the rate of osteoporosis.  Menopause can be associated with physical symptoms and risks. Hormone replacement therapy is available to decrease symptoms and risks. You should talk to your caregiver about whether hormone replacement therapy is right for you.  Use sunscreen with sun protection factor (SPF) of 30 or more. Apply sunscreen liberally and repeatedly throughout the day. You should seek shade when your shadow is shorter than you. Protect yourself by wearing long sleeves, pants, a wide-brimmed hat, and sunglasses year round, whenever you are outdoors.  Once a month, do a whole body skin exam, using a mirror to look at the skin on your back. Notify your caregiver of new moles, moles that have irregular borders, moles that are larger than a pencil eraser, or moles that have changed in shape or color.  Stay current with required immunizations.  Influenza. You need a dose every fall (or winter). The composition of the flu vaccine  changes each year, so being vaccinated once is not enough.  Pneumococcal polysaccharide. You need 1 to 2 doses if you smoke cigarettes or if you have certain chronic medical conditions. You need 1 dose at age 65 (or older) if you have never been vaccinated.  Tetanus, diphtheria, pertussis (Tdap, Td). Get 1 dose of Tdap vaccine if you are younger than age 65, are over 65 and have contact with an infant, are a healthcare worker, are pregnant, or simply want to be protected from whooping cough. After that, you need a Td   booster dose every 10 years. Consult your caregiver if you have not had at least 3 tetanus and diphtheria-containing shots sometime in your life or have a deep or dirty wound.  HPV. You need this vaccine if you are a woman age 26 or younger. The vaccine is given in 3 doses over 6 months.  Measles, mumps, rubella (MMR). You need at least 1 dose of MMR if you were born in 1957 or later. You may also need a second dose.  Meningococcal. If you are age 19 to 21 and a first-year college student living in a residence hall, or have one of several medical conditions, you need to get vaccinated against meningococcal disease. You may also need additional booster doses.  Zoster (shingles). If you are age 60 or older, you should get this vaccine.  Varicella (chickenpox). If you have never had chickenpox or you were vaccinated but received only 1 dose, talk to your caregiver to find out if you need this vaccine.  Hepatitis A. You need this vaccine if you have a specific risk factor for hepatitis A virus infection or you simply wish to be protected from this disease. The vaccine is usually given as 2 doses, 6 to 18 months apart.  Hepatitis B. You need this vaccine if you have a specific risk factor for hepatitis B virus infection or you simply wish to be protected from this disease. The vaccine is given in 3 doses, usually over 6 months. Preventive Services / Frequency Ages 19 to 39  Blood  pressure check.** / Every 1 to 2 years.  Lipid and cholesterol check.** / Every 5 years beginning at age 20.  Clinical breast exam.** / Every 3 years for women in their 20s and 30s.  Pap test.** / Every 2 years from ages 21 through 29. Every 3 years starting at age 30 through age 65 or 70 with a history of 3 consecutive normal Pap tests.  HPV screening.** / Every 3 years from ages 30 through ages 65 to 70 with a history of 3 consecutive normal Pap tests.  Hepatitis C blood test.** / For any individual with known risks for hepatitis C.  Skin self-exam. / Monthly.  Influenza immunization.** / Every year.  Pneumococcal polysaccharide immunization.** / 1 to 2 doses if you smoke cigarettes or if you have certain chronic medical conditions.  Tetanus, diphtheria, pertussis (Tdap, Td) immunization. / A one-time dose of Tdap vaccine. After that, you need a Td booster dose every 10 years.  HPV immunization. / 3 doses over 6 months, if you are 26 and younger.  Measles, mumps, rubella (MMR) immunization. / You need at least 1 dose of MMR if you were born in 1957 or later. You may also need a second dose.  Meningococcal immunization. / 1 dose if you are age 19 to 21 and a first-year college student living in a residence hall, or have one of several medical conditions, you need to get vaccinated against meningococcal disease. You may also need additional booster doses.  Varicella immunization.** / Consult your caregiver.  Hepatitis A immunization.** / Consult your caregiver. 2 doses, 6 to 18 months apart.  Hepatitis B immunization.** / Consult your caregiver. 3 doses usually over 6 months. Ages 40 to 64  Blood pressure check.** / Every 1 to 2 years.  Lipid and cholesterol check.** / Every 5 years beginning at age 20.  Clinical breast exam.** / Every year after age 40.  Mammogram.** / Every year beginning at age 40   and continuing for as long as you are in good health. Consult with your  caregiver.  Pap test.** / Every 3 years starting at age 30 through age 65 or 70 with a history of 3 consecutive normal Pap tests.  HPV screening.** / Every 3 years from ages 30 through ages 65 to 70 with a history of 3 consecutive normal Pap tests.  Fecal occult blood test (FOBT) of stool. / Every year beginning at age 50 and continuing until age 75. You may not need to do this test if you get a colonoscopy every 10 years.  Flexible sigmoidoscopy or colonoscopy.** / Every 5 years for a flexible sigmoidoscopy or every 10 years for a colonoscopy beginning at age 50 and continuing until age 75.  Hepatitis C blood test.** / For all people born from 1945 through 1965 and any individual with known risks for hepatitis C.  Skin self-exam. / Monthly.  Influenza immunization.** / Every year.  Pneumococcal polysaccharide immunization.** / 1 to 2 doses if you smoke cigarettes or if you have certain chronic medical conditions.  Tetanus, diphtheria, pertussis (Tdap, Td) immunization.** / A one-time dose of Tdap vaccine. After that, you need a Td booster dose every 10 years.  Measles, mumps, rubella (MMR) immunization. / You need at least 1 dose of MMR if you were born in 1957 or later. You may also need a second dose.  Varicella immunization.** / Consult your caregiver.  Meningococcal immunization.** / Consult your caregiver.  Hepatitis A immunization.** / Consult your caregiver. 2 doses, 6 to 18 months apart.  Hepatitis B immunization.** / Consult your caregiver. 3 doses, usually over 6 months. Ages 65 and over  Blood pressure check.** / Every 1 to 2 years.  Lipid and cholesterol check.** / Every 5 years beginning at age 20.  Clinical breast exam.** / Every year after age 40.  Mammogram.** / Every year beginning at age 40 and continuing for as long as you are in good health. Consult with your caregiver.  Pap test.** / Every 3 years starting at age 30 through age 65 or 70 with a 3  consecutive normal Pap tests. Testing can be stopped between 65 and 70 with 3 consecutive normal Pap tests and no abnormal Pap or HPV tests in the past 10 years.  HPV screening.** / Every 3 years from ages 30 through ages 65 or 70 with a history of 3 consecutive normal Pap tests. Testing can be stopped between 65 and 70 with 3 consecutive normal Pap tests and no abnormal Pap or HPV tests in the past 10 years.  Fecal occult blood test (FOBT) of stool. / Every year beginning at age 50 and continuing until age 75. You may not need to do this test if you get a colonoscopy every 10 years.  Flexible sigmoidoscopy or colonoscopy.** / Every 5 years for a flexible sigmoidoscopy or every 10 years for a colonoscopy beginning at age 50 and continuing until age 75.  Hepatitis C blood test.** / For all people born from 1945 through 1965 and any individual with known risks for hepatitis C.  Osteoporosis screening.** / A one-time screening for women ages 65 and over and women at risk for fractures or osteoporosis.  Skin self-exam. / Monthly.  Influenza immunization.** / Every year.  Pneumococcal polysaccharide immunization.** / 1 dose at age 65 (or older) if you have never been vaccinated.  Tetanus, diphtheria, pertussis (Tdap, Td) immunization. / A one-time dose of Tdap vaccine if you are over   65 and have contact with an infant, are a healthcare worker, or simply want to be protected from whooping cough. After that, you need a Td booster dose every 10 years.  Varicella immunization.** / Consult your caregiver.  Meningococcal immunization.** / Consult your caregiver.  Hepatitis A immunization.** / Consult your caregiver. 2 doses, 6 to 18 months apart.  Hepatitis B immunization.** / Check with your caregiver. 3 doses, usually over 6 months. ** Family history and personal history of risk and conditions may change your caregiver's recommendations. Document Released: 01/29/2002 Document Revised: 02/25/2012  Document Reviewed: 04/30/2011 [employer] Patient Information 2014 [employer].  

## 2013-08-24 NOTE — Assessment & Plan Note (Signed)
Stop vytorin seconday to muscle aches Call us in 2 weeks

## 2013-08-25 ENCOUNTER — Other Ambulatory Visit: Payer: Self-pay

## 2013-08-25 MED ORDER — FENOFIBRATE MICRONIZED 90 MG PO CAPS: 1.0000 | ORAL_CAPSULE | Freq: Every day | ORAL | Status: DC

## 2013-08-28 ENCOUNTER — Encounter: Payer: Self-pay | Admitting: Family Medicine

## 2013-08-31 LAB — VIRAL CULTURE VIRC

## 2013-09-01 ENCOUNTER — Encounter: Payer: Self-pay | Admitting: Family Medicine

## 2013-10-27 ENCOUNTER — Encounter: Payer: Self-pay | Admitting: Family Medicine

## 2013-11-02 ENCOUNTER — Encounter: Payer: Self-pay | Admitting: Family Medicine

## 2013-12-30 ENCOUNTER — Other Ambulatory Visit: Payer: Self-pay | Admitting: Family Medicine

## 2013-12-30 DIAGNOSIS — R21 Rash and other nonspecific skin eruption: Secondary | ICD-10-CM

## 2013-12-30 MED ORDER — VALACYCLOVIR HCL 1 G PO TABS: ORAL_TABLET | ORAL | Status: DC

## 2014-01-11 ENCOUNTER — Other Ambulatory Visit: Payer: Self-pay | Admitting: Family Medicine

## 2014-04-05 ENCOUNTER — Other Ambulatory Visit: Payer: Self-pay | Admitting: Family Medicine

## 2014-04-14 ENCOUNTER — Other Ambulatory Visit: Payer: Self-pay | Admitting: Family Medicine

## 2014-08-02 ENCOUNTER — Other Ambulatory Visit: Payer: Self-pay | Admitting: Family Medicine

## 2014-08-02 NOTE — Telephone Encounter (Signed)
Last seen and filled 08/24/13 #[id number]. Please advise     KP

## 2014-10-01 ENCOUNTER — Other Ambulatory Visit: Payer: Self-pay

## 2015-01-27 ENCOUNTER — Telehealth: Payer: Self-pay | Admitting: Pulmonary Disease

## 2015-01-27 NOTE — Telephone Encounter (Signed)
lmomtcb x1 Pt has not been seen since 2014 and no pending appt

## 2015-01-31 NOTE — Telephone Encounter (Signed)
lmtcb for pt.  

## 2015-02-01 NOTE — Telephone Encounter (Signed)
lmtcb x3 

## 2015-02-02 NOTE — Telephone Encounter (Signed)
lmtcb for pt.   ATC alternate number on file. No option for VM. Per triage protocol. Will sign off.

## 2015-05-03 ENCOUNTER — Other Ambulatory Visit: Payer: Self-pay | Admitting: Obstetrics and Gynecology

## 2015-05-04 LAB — CYTOLOGY - PAP

## 2016-02-29 ENCOUNTER — Encounter: Payer: Self-pay | Admitting: Internal Medicine

## 2016-02-29 ENCOUNTER — Telehealth: Payer: Self-pay | Admitting: Internal Medicine

## 2016-02-29 NOTE — Telephone Encounter (Signed)
Attempted to return call.  Phone number does not work.  No answer on the other line.  I will attempt to call back tomorrow

## 2016-03-01 NOTE — Telephone Encounter (Signed)
I have attempted to return call again on both phone lines.  When I call on the 551 line there is no activity on the line only silence.  I did try and call on her other line and there is no answer

## 2016-03-02 NOTE — Telephone Encounter (Signed)
Still not able to get through on any lines.  I will await a return call from the patient

## 2016-03-17 DIAGNOSIS — K5790 Diverticulosis of intestine, part unspecified, without perforation or abscess without bleeding: Secondary | ICD-10-CM

## 2016-03-17 HISTORY — DX: Diverticulosis of intestine, part unspecified, without perforation or abscess without bleeding: K57.90

## 2016-03-21 ENCOUNTER — Ambulatory Visit (AMBULATORY_SURGERY_CENTER): Payer: Self-pay

## 2016-03-21 VITALS — Ht 61.5 in | Wt 181.2 lb

## 2016-03-21 DIAGNOSIS — Z860100 Personal history of colon polyps, unspecified: Secondary | ICD-10-CM

## 2016-03-21 NOTE — Progress Notes (Signed)
No allergies to eggs or soy No home oxygen No diet/weight loss meds No past problems with anesthesia  Has email and internet; registered for emmi

## 2016-04-11 ENCOUNTER — Ambulatory Visit (AMBULATORY_SURGERY_CENTER): Payer: Managed Care, Other (non HMO) | Admitting: Internal Medicine

## 2016-04-11 ENCOUNTER — Telehealth: Payer: Self-pay | Admitting: Internal Medicine

## 2016-04-11 ENCOUNTER — Encounter: Payer: Self-pay | Admitting: Internal Medicine

## 2016-04-11 VITALS — BP 110/59 | HR 60 | Temp 96.0°F | Resp 18 | Ht 61.5 in | Wt 181.0 lb

## 2016-04-11 DIAGNOSIS — Z86010 Personal history of colonic polyps: Secondary | ICD-10-CM | POA: Diagnosis not present

## 2016-04-11 DIAGNOSIS — D122 Benign neoplasm of ascending colon: Secondary | ICD-10-CM

## 2016-04-11 HISTORY — PX: COLONOSCOPY W/ POLYPECTOMY: SHX1380

## 2016-04-11 MED ORDER — SODIUM CHLORIDE 0.9 % IV SOLN: 500.0000 mL | INTRAVENOUS | Status: DC

## 2016-04-11 NOTE — Progress Notes (Signed)
Called to room to assist during endoscopic procedure.  Patient ID and intended procedure confirmed with present staff. Received instructions for my participation in the procedure from the performing physician.  

## 2016-04-11 NOTE — Op Note (Signed)
[place] Patient Name: Robin Small Procedure Date: 04/11/2016 8:44 AM MRN: [id number] Endoscopist: [name] , MD Age: 55 Date of Birth: July 11, 1961 Gender: Female Procedure:                Colonoscopy Indications:              Surveillance: Personal history of adenomatous                            polyps on last colonoscopy > 5 years ago Medicines:                Propofol per Anesthesia, Monitored Anesthesia Care Procedure:                Pre-Anesthesia Assessment:                           - Prior to the procedure, a History and Physical                            was performed, and patient medications and                            allergies were reviewed. The patient's tolerance of                            previous anesthesia was also reviewed. The risks                            and benefits of the procedure and the sedation                            options and risks were discussed with the patient.                            All questions were answered, and informed consent                            was obtained. Prior Anticoagulants: The patient has                            taken no previous anticoagulant or antiplatelet                            agents. ASA Grade Assessment: II - A patient with                            mild systemic disease. After reviewing the risks                            and benefits, the patient was deemed in                            satisfactory condition to undergo the procedure.  After obtaining informed consent, the colonoscope                            was passed under direct vision. Throughout the                            procedure, the patient's blood pressure, pulse, and                            oxygen saturations were monitored continuously. The                            Model CF-HQ190L [id number]) scope was introduced                            through the anus and advanced to the  the cecum,                            identified by appendiceal orifice and ileocecal                            valve. The colonoscopy was performed without                            difficulty. The patient tolerated the procedure                            well. The quality of the bowel preparation was                            good. The bowel preparation used was Miralax. The                            ileocecal valve, appendiceal orifice, and rectum                            were photographed. Scope In: 9:00:00 AM Scope Out: 9:14:38 AM Scope Withdrawal Time: 0 hours 12 minutes 59 seconds  Total Procedure Duration: 0 hours 14 minutes 38 seconds  Findings:                 A 5 mm polyp was found in the ascending colon. The                            polyp was sessile. The polyp was removed with a                            cold snare. Resection and retrieval were complete.                            Verification of patient identification for the                            specimen was done. Estimated blood loss  was minimal.                           Multiple small-mouthed diverticula were found in                            the sigmoid colon. There was no evidence of                            diverticular bleeding.                           Internal hemorrhoids were found during retroflexion.                           The perianal exam findings include skin tags.                           The digital rectal exam was normal.                           The exam was otherwise without abnormality on                            direct and retroflexion views. Complications:            No immediate complications. Estimated Blood Loss:     Estimated blood loss was minimal. Impression:               - One 5 mm polyp in the ascending colon, removed                            with a cold snare. Resected and retrieved.                           - Mild diverticulosis in the sigmoid colon. There                             was no evidence of diverticular bleeding.                           - Internal hemorrhoids.                           - Perianal skin tags found on perianal exam. Recommendation:           - Written discharge instructions were provided to                            the patient.                           - Patient has a contact number available for                            emergencies. The signs and symptoms of potential  delayed complications were discussed with the                            patient. Return to normal activities tomorrow.                            Written discharge instructions were provided to the                            patient.                           - Resume previous diet.                           - Continue present medications.                           - Repeat colonoscopy is recommended for                            surveillance. The colonoscopy date will be                            determined after pathology results from today's                            exam become available for review. [name], MD 04/11/2016 9:29:18 AM This report has been signed electronically.

## 2016-04-11 NOTE — Patient Instructions (Addendum)
I found and removed one small polyp that looks benign.  You also have a condition called diverticulosis - common and not usually a problem. Please read the handout provided.  I will let you know pathology results and when to have another routine colonoscopy by mail.  I appreciate the opportunity to care for you. [name], MD, FACGYOU HAD AN ENDOSCOPIC PROCEDURE TODAY [facility]:   Refer to the procedure report that was given to you for any specific questions about what was found during the examination.  If the procedure report does not answer your questions, please call your gastroenterologist to clarify.  If you requested that your care partner not be given the details of your procedure findings, then the procedure report has been included in a sealed envelope for you to review at your convenience later.  YOU SHOULD EXPECT: Some feelings of bloating in the abdomen. Passage of more gas than usual.  Walking can help get rid of the air that was put into your GI tract during the procedure and reduce the bloating. If you had a lower endoscopy (such as a colonoscopy or flexible sigmoidoscopy) you may notice spotting of blood in your stool or on the toilet paper. If you underwent a bowel prep for your procedure, you may not have a normal bowel movement for a few days.  Please Note:  You might notice some irritation and congestion in your nose or some drainage.  This is from the oxygen used during your procedure.  There is no need for concern and it should clear up in a day or so.  SYMPTOMS TO REPORT IMMEDIATELY:   Following lower endoscopy (colonoscopy or flexible sigmoidoscopy):  Excessive amounts of blood in the stool  Significant tenderness or worsening of abdominal pains  Swelling of the abdomen that is new, acute  Fever of 100F or higher   Following upper endoscopy (EGD)  Vomiting of blood or coffee ground material  New chest pain or pain under the  shoulder blades  Painful or persistently difficult swallowing  New shortness of breath  Fever of 100F or higher  Black, tarry-looking stools  For urgent or emergent issues, a gastroenterologist can be reached at any hour by calling [phone].   DIET: Your first meal following the procedure should be a small meal and then it is ok to progress to your normal diet. Heavy or fried foods are harder to digest and may make you feel nauseous or bloated.  Likewise, meals heavy in dairy and vegetables can increase bloating.  Drink plenty of fluids but you should avoid alcoholic beverages for 24 hours.  ACTIVITY:  You should plan to take it easy for the rest of today and you should NOT DRIVE or use heavy machinery until tomorrow (because of the sedation medicines used during the test).    FOLLOW UP: Our staff will call the number listed on your records the next business day following your procedure to check on you and address any questions or concerns that you may have regarding the information given to you following your procedure. If we do not reach you, we will leave a message.  However, if you are feeling well and you are not experiencing any problems, there is no need to return our call.  We will assume that you have returned to your regular daily activities without incident.  If any biopsies were taken you will be contacted by phone or by letter within the next  1-3 weeks.  Please call us at [phone] if you have not heard about the biopsies in 3 weeks.    SIGNATURES/CONFIDENTIALITY: You and/or your care partner have signed paperwork which will be entered into your electronic medical record.  These signatures attest to the fact that that the information above on your After Visit Summary has been reviewed and is understood.  Full responsibility of the confidentiality of this discharge information lies with you and/or your care-partner.  Polyps and diverticulosis information given.  Dr.  Carlean Purl will get you an appointment with a surgeon for, hemorrhoid evaluation.,

## 2016-04-11 NOTE — Progress Notes (Signed)
A and Ox 3 Report to RN 

## 2016-04-12 ENCOUNTER — Telehealth: Payer: Self-pay

## 2016-04-12 NOTE — Telephone Encounter (Signed)
Patient is scheduled to see Dr. Marcello Moores with [facility] for 04/24/16 arrive at 10:00 for 10:30 appt Left message for patient to call back

## 2016-04-12 NOTE — Telephone Encounter (Signed)
Left message on answering machine. 

## 2016-04-12 NOTE — Telephone Encounter (Signed)
I left a message for [facility] to make a referral

## 2016-04-12 NOTE — Telephone Encounter (Signed)
I left a message for [facility] regarding referral

## 2016-04-12 NOTE — Telephone Encounter (Signed)
Patient called back and asked that she be rescheduled to an early am appt.  I left her a message with the number to [facility] so she can reschedule at her convenience.

## 2016-04-16 ENCOUNTER — Encounter: Payer: Self-pay | Admitting: Internal Medicine

## 2016-04-16 DIAGNOSIS — Z86010 Personal history of colonic polyps: Secondary | ICD-10-CM

## 2016-04-16 NOTE — Progress Notes (Signed)
Quick Note:  5 mm adenoma Recall colon 2022 ______

## 2016-05-23 ENCOUNTER — Ambulatory Visit (INDEPENDENT_AMBULATORY_CARE_PROVIDER_SITE_OTHER): Payer: Managed Care, Other (non HMO) | Admitting: Urgent Care

## 2016-05-23 VITALS — BP 122/78 | HR 84 | Temp 98.0°F | Resp 16 | Ht 61.0 in | Wt 181.0 lb

## 2016-05-23 DIAGNOSIS — J029 Acute pharyngitis, unspecified: Secondary | ICD-10-CM | POA: Diagnosis not present

## 2016-05-23 DIAGNOSIS — J019 Acute sinusitis, unspecified: Secondary | ICD-10-CM | POA: Diagnosis not present

## 2016-05-23 DIAGNOSIS — R05 Cough: Secondary | ICD-10-CM

## 2016-05-23 DIAGNOSIS — J302 Other seasonal allergic rhinitis: Secondary | ICD-10-CM

## 2016-05-23 DIAGNOSIS — R059 Cough, unspecified: Secondary | ICD-10-CM

## 2016-05-23 MED ORDER — MOMETASONE FUROATE 50 MCG/ACT NA SUSP: 2.0000 | Freq: Every day | NASAL | Status: DC

## 2016-05-23 MED ORDER — HYDROCODONE-HOMATROPINE 5-1.5 MG/5ML PO SYRP: 5.0000 mL | ORAL_SOLUTION | Freq: Every evening | ORAL | Status: DC | PRN

## 2016-05-23 MED ORDER — BENZONATATE 100 MG PO CAPS: 100.0000 mg | ORAL_CAPSULE | Freq: Three times a day (TID) | ORAL | Status: DC | PRN

## 2016-05-23 MED ORDER — DOXYCYCLINE HYCLATE 100 MG PO CAPS: 100.0000 mg | ORAL_CAPSULE | Freq: Two times a day (BID) | ORAL | Status: DC

## 2016-05-23 MED ORDER — CETIRIZINE HCL 10 MG PO TABS: 10.0000 mg | ORAL_TABLET | Freq: Every day | ORAL | Status: DC

## 2016-05-23 NOTE — Patient Instructions (Addendum)
Sinusitis, Adult Sinusitis is redness, soreness, and inflammation of the paranasal sinuses. Paranasal sinuses are air pockets within the bones of your face. They are located beneath your eyes, in the middle of your forehead, and above your eyes. In healthy paranasal sinuses, mucus is able to drain out, and air is able to circulate through them by way of your nose. However, when your paranasal sinuses are inflamed, mucus and air can become trapped. This can allow bacteria and other germs to grow and cause infection. Sinusitis can develop quickly and last only a short time (acute) or continue over a long period (chronic). Sinusitis that lasts for more than 12 weeks is considered chronic. CAUSES Causes of sinusitis include:  Allergies.  Structural abnormalities, such as displacement of the cartilage that separates your nostrils (deviated septum), which can decrease the air flow through your nose and sinuses and affect sinus drainage.  Functional abnormalities, such as when the small hairs (cilia) that line your sinuses and help remove mucus do not work properly or are not present. SIGNS AND SYMPTOMS Symptoms of acute and chronic sinusitis are the same. The primary symptoms are pain and pressure around the affected sinuses. Other symptoms include:  Upper toothache.  Earache.  Headache.  Bad breath.  Decreased sense of smell and taste.  A cough, which worsens when you are lying flat.  Fatigue.  Fever.  Thick drainage from your nose, which often is green and may contain pus (purulent).  Swelling and warmth over the affected sinuses. DIAGNOSIS Your health care provider will perform a physical exam. During your exam, your health care provider may perform any of the following to help determine if you have acute sinusitis or chronic sinusitis:  Look in your nose for signs of abnormal growths in your nostrils (nasal polyps).  Tap over the affected sinus to check for signs of  infection.  View the inside of your sinuses using an imaging device that has a light attached (endoscope). If your health care provider suspects that you have chronic sinusitis, one or more of the following tests may be recommended:  Allergy tests.  Nasal culture. A sample of mucus is taken from your nose, sent to a lab, and screened for bacteria.  Nasal cytology. A sample of mucus is taken from your nose and examined by your health care provider to determine if your sinusitis is related to an allergy. TREATMENT Most cases of acute sinusitis are related to a viral infection and will resolve on their own within 10 days. Sometimes, medicines are prescribed to help relieve symptoms of both acute and chronic sinusitis. These may include pain medicines, decongestants, nasal steroid sprays, or saline sprays. However, for sinusitis related to a bacterial infection, your health care provider will prescribe antibiotic medicines. These are medicines that will help kill the bacteria causing the infection. Rarely, sinusitis is caused by a fungal infection. In these cases, your health care provider will prescribe antifungal medicine. For some cases of chronic sinusitis, surgery is needed. Generally, these are cases in which sinusitis recurs more than 3 times per year, despite other treatments. HOME CARE INSTRUCTIONS  Drink plenty of water. Water helps thin the mucus so your sinuses can drain more easily.  Use a humidifier.  Inhale steam 3-4 times a day (for example, sit in the bathroom with the shower running).  Apply a warm, moist washcloth to your face 3-4 times a day, or as directed by your health care provider.  Use saline nasal sprays to help   moisten and clean your sinuses.  Take medicines only as directed by your health care provider.  If you were prescribed either an antibiotic or antifungal medicine, finish it all even if you start to feel better. SEEK IMMEDIATE MEDICAL CARE IF:  You have  increasing pain or severe headaches.  You have nausea, vomiting, or drowsiness.  You have swelling around your face.  You have vision problems.  You have a stiff neck.  You have difficulty breathing.   This information is not intended to replace advice given to you by your health care provider. Make sure you discuss any questions you have with your health care provider.   Document Released: 12/03/2005 Document Revised: 12/24/2014 Document Reviewed: 12/18/2011 Elsevier Interactive Patient Education 2016 Elsevier Inc[employer]    IF you received an x-ray today, you will receive an invoice from [facility]. Please contact  Radiology at [phone] with questions or concerns regarding your invoice.   IF you received labwork today, you will receive an invoice from [facility]. Please contact [facility] at [phone] with questions or concerns regarding your invoice.   Our billing staff will not be able to assist you with questions regarding bills from these companies.  You will be contacted with the lab results as soon as they are available. The fastest way to get your results is to activate your My Chart account. Instructions are located on the last page of this paperwork. If you have not heard from us regarding the results in 2 weeks, please contact this office.      

## 2016-05-23 NOTE — Progress Notes (Signed)
    MRN: [id number] DOB: 08/30/1961  Subjective:   Robin Small is a 55 y.o. female presenting for chief complaint of Cough; Sore Throat; and Nasal Congestion  Reports 9 day history productive cough, sore throat, nasal congestion, subjective fever. Cough is worst at night, elicits shob, some wheezing. Has been using Claritin, Sudafed, NyQuil with some relief. She has also used her albuterol inhaler this past week. Generally uses it once a month otherwise. Patient talks on the phone for her work. Denies chest pain, sinus pain, ear pain. Patient quit smoking years ago.  Robin Small has a current medication list which includes the following prescription(s): calcium-d, clonazepam, glucosamine-chondroitin, loratadine, paroxetine, proair hfa, psyllium, and ranitidine. Also is allergic to codeine.  Robin Small  has a past medical history of High cholesterol; Depression; Allergy; Arthritis; Hypertension; PPD positive; and Personal history of colonic polyps (04/06/2011). Also  has past surgical history that includes ARTHROSCOPIC KNEE; Cesarean section; Ganglion cyst excision; Endometrial ablation; Tubal ligation; and Colonoscopy.  Objective:   Vitals: BP 122/78 mmHg  Pulse 84  Temp(Src) 98 F (36.7 C) (Oral)  Resp 16  Ht 5\' 1"  (1.549 m)  Wt 181 lb (82.101 kg)  BMI 34.22 kg/m2  SpO2 95%  Physical Exam  Constitutional: She is oriented to person, place, and time. She appears well-developed and well-nourished.  HENT:  TM's flat bilaterally, no effusions or erythema. Nasal turbinates edematous with thick white mucus. Bilateral maxillary sinus tenderness. Postnasal drip present, without oropharyngeal exudates, erythema or abscesses.  Eyes: Right eye exhibits no discharge. Left eye exhibits no discharge. No scleral icterus.  Neck: Normal range of motion. Neck supple.  Cardiovascular: Normal rate, regular rhythm and intact distal pulses.  Exam reveals no gallop and no friction rub.   No murmur  heard. Pulmonary/Chest: No respiratory distress. She has no wheezes. She has no rales.  Lymphadenopathy:    She has no cervical adenopathy.  Neurological: She is alert and oriented to person, place, and time.  Skin: Skin is warm and dry.   Assessment and Plan :   1. Acute sinusitis, recurrence not specified, unspecified location 2. Cough 3. Sore throat - Start doxycycline to address infectious process. Hycodan and Tessalon for sore throat and cough. RTC in 1 week if no improvement.  4. Seasonal allergies - Switch to Zyrtec, restart Nasonex.   Jaynee Eagles, PA-C [facility] [phone] 05/23/2016 2:54 PM

## 2017-01-14 ENCOUNTER — Telehealth: Payer: Self-pay | Admitting: Internal Medicine

## 2017-01-14 NOTE — Telephone Encounter (Signed)
Patient notified

## 2017-01-14 NOTE — Telephone Encounter (Signed)
Referral faxed to [phone]

## 2017-01-24 DIAGNOSIS — R112 Nausea with vomiting, unspecified: Secondary | ICD-10-CM | POA: Insufficient documentation

## 2017-01-24 DIAGNOSIS — Z9889 Other specified postprocedural states: Secondary | ICD-10-CM | POA: Insufficient documentation

## 2017-02-01 DIAGNOSIS — Z8719 Personal history of other diseases of the digestive system: Secondary | ICD-10-CM | POA: Insufficient documentation

## 2017-09-02 ENCOUNTER — Ambulatory Visit (INDEPENDENT_AMBULATORY_CARE_PROVIDER_SITE_OTHER): Payer: Managed Care, Other (non HMO) | Admitting: Family Medicine

## 2017-09-02 ENCOUNTER — Encounter: Payer: Self-pay | Admitting: Family Medicine

## 2017-09-02 VITALS — BP 108/72 | HR 79 | Temp 98.6°F | Resp 16 | Ht 61.0 in | Wt 186.2 lb

## 2017-09-02 DIAGNOSIS — J029 Acute pharyngitis, unspecified: Secondary | ICD-10-CM | POA: Diagnosis not present

## 2017-09-02 DIAGNOSIS — J301 Allergic rhinitis due to pollen: Secondary | ICD-10-CM

## 2017-09-02 DIAGNOSIS — J209 Acute bronchitis, unspecified: Secondary | ICD-10-CM

## 2017-09-02 DIAGNOSIS — R059 Cough, unspecified: Secondary | ICD-10-CM

## 2017-09-02 DIAGNOSIS — R05 Cough: Secondary | ICD-10-CM | POA: Diagnosis not present

## 2017-09-02 MED ORDER — FLUCONAZOLE 150 MG PO TABS: 150.0000 mg | ORAL_TABLET | Freq: Once | ORAL | 0 refills | Status: AC

## 2017-09-02 MED ORDER — PREDNISONE 20 MG PO TABS: 40.0000 mg | ORAL_TABLET | Freq: Every day | ORAL | 0 refills | Status: AC

## 2017-09-02 MED ORDER — ALBUTEROL SULFATE HFA 108 (90 BASE) MCG/ACT IN AERS: 2.0000 | INHALATION_SPRAY | Freq: Four times a day (QID) | RESPIRATORY_TRACT | 1 refills | Status: DC | PRN

## 2017-09-02 MED ORDER — AMOXICILLIN-POT CLAVULANATE 875-125 MG PO TABS: 1.0000 | ORAL_TABLET | Freq: Two times a day (BID) | ORAL | 0 refills | Status: DC

## 2017-09-02 NOTE — Patient Instructions (Addendum)
IF you received an x-ray today, you will receive an invoice from [facility]. Please contact [facility] at [phone] with questions or concerns regarding your invoice.   IF you received labwork today, you will receive an invoice from [facility]. Please contact [facility] at [phone] with questions or concerns regarding your invoice.   Our billing staff will not be able to assist you with questions regarding bills from these companies.  You will be contacted with the lab results as soon as they are available. The fastest way to get your results is to activate your My Chart account. Instructions are located on the last page of this paperwork. If you have not heard from [place] regarding the results in 2 weeks, please contact this office.    We recommend that you schedule a mammogram for breast cancer screening. Typically, you do not need a referral to do this. Please contact a local imaging center to schedule your mammogram.  [facility] - [phone]  *ask for the Radiology Department [facility] - [phone] or [phone]  [facility] - [phone] [place] [phone] [facility] - [phone]  *ask for the [facility] - [phone]  *ask for the Radiology Department [facility] - [phone]  *ask for the Mammography Department [facility] - [phone] Acute Bronchitis, Adult Acute bronchitis is sudden (acute) swelling of the air tubes (bronchi) in the lungs. Acute bronchitis causes these tubes to fill with mucus, which can make it hard to breathe. It can also cause coughing or wheezing. In adults, acute bronchitis usually goes away within 2 weeks. A cough caused by bronchitis may last up to 3 weeks. Smoking, allergies, and asthma can make the condition worse. Repeated episodes of bronchitis may cause further lung  problems, such as chronic obstructive pulmonary disease (COPD). What are the causes? This condition can be caused by germs and by substances that irritate the lungs, including:  Cold and flu viruses. This condition is most often caused by the same virus that causes a cold.  Bacteria.  Exposure to tobacco smoke, dust, fumes, and air pollution.  What increases the risk? This condition is more likely to develop in people who:  Have close contact with someone with acute bronchitis.  Are exposed to lung irritants, such as tobacco smoke, dust, fumes, and vapors.  Have a weak immune system.  Have a respiratory condition such as asthma.  What are the signs or symptoms? Symptoms of this condition include:  A cough.  Coughing up clear, yellow, or green mucus.  Wheezing.  Chest congestion.  Shortness of breath.  A fever.  Body aches.  Chills.  A sore throat.  How is this diagnosed? This condition is usually diagnosed with a physical exam. During the exam, your health care provider may order tests, such as chest X-rays, to rule out other conditions. He or she may also:  Test a sample of your mucus for bacterial infection.  Check the level of oxygen in your blood. This is done to check for pneumonia.  Do a chest X-ray or lung function testing to rule out pneumonia and other conditions.  Perform blood tests.  Your health care provider will also ask about your symptoms and medical history. How is this treated? Most cases of acute bronchitis clear up over time without treatment. Your health care provider may recommend:  Drinking more fluids. Drinking more  makes your mucus thinner, which may make it easier to breathe.  Taking a medicine for a fever or cough.  Taking an antibiotic medicine.  Using an inhaler to help improve shortness of breath and to control a cough.  Using a cool mist vaporizer or humidifier to make it easier to breathe.  Follow these instructions at  home: Medicines  Take over-the-counter and prescription medicines only as told by your health care provider.  If you were prescribed an antibiotic, take it as told by your health care provider. Do not stop taking the antibiotic even if you start to feel better. General instructions  Get plenty of rest.  Drink enough fluids to keep your urine clear or pale yellow.  Avoid smoking and secondhand smoke. Exposure to cigarette smoke or irritating chemicals will make bronchitis worse. If you smoke and you need help quitting, ask your health care provider. Quitting smoking will help your lungs heal faster.  Use an inhaler, cool mist vaporizer, or humidifier as told by your health care provider.  Keep all follow-up visits as told by your health care provider. This is important. How is this prevented? To lower your risk of getting this condition again:  Wash your hands often with soap and water. If soap and water are not available, use hand sanitizer.  Avoid contact with people who have cold symptoms.  Try not to touch your hands to your mouth, nose, or eyes.  Make sure to get the flu shot every year.  Contact a health care provider if:  Your symptoms do not improve in 2 weeks of treatment. Get help right away if:  You cough up blood.  You have chest pain.  You have severe shortness of breath.  You become dehydrated.  You faint or keep feeling like you are going to faint.  You keep vomiting.  You have a severe headache.  Your fever or chills gets worse. This information is not intended to replace advice given to you by your health care provider. Make sure you discuss any questions you have with your health care provider. Document Released: 01/10/2005 Document Revised: 06/27/2016 Document Reviewed: 05/23/2016 Elsevier Interactive Patient Education  2017 [employer].

## 2017-09-02 NOTE — Progress Notes (Signed)
Chief Complaint  Patient presents with  . URI    seasonal cold x 1 week, alternated claritin and zyrtec, then switched xyzal for 10 days with no relief, green/yellowish discharge from nose, tried sudafed on yesterday and nose ran all day, now experiencing chest congestion, ear and sinus issues  . Fever    low grade no temps taken with thermometer    HPI  Patient reports that she has been having a week of congestion and runny nose with cough and intermittent tightness Also feels like her sinuses are infected She blue her nose and green foul smelling mucus came out No history of asthma Non smoker Low grade fever She took some plane sudafed  Past Medical History:  Diagnosis Date  . Allergy    seasonal allergies  . Arthritis   . Depression   . High cholesterol   . Hypertension   . Personal history of colonic polyps 04/06/2011  . PPD positive    Age 9 >> reports taking medicine for one year    Current Outpatient Prescriptions  Medication Sig Dispense Refill  . Calcium Carbonate-Vitamin D (CALCIUM-D) 600-400 MG-UNIT TABS Take by mouth.    . cetirizine (ZYRTEC) 10 MG tablet Take 1 tablet (10 mg total) by mouth daily. 30 tablet 11  . clonazePAM (KLONOPIN) 0.5 MG tablet TAKE 1 TABLET BY MOUTH THREE TIMES DAILY AS NEEDED FOR ANXIETY 60 tablet 0  . glucosamine-chondroitin 500-400 MG tablet Take 1 tablet by mouth 2 (two) times daily.    Marland Kitchen loratadine (CLARITIN) 10 MG tablet Take 10 mg by mouth daily. 1 DAILY      . mometasone (NASONEX) 50 MCG/ACT nasal spray Place 2 sprays into the nose daily. 17 g 12  . albuterol (PROVENTIL HFA;VENTOLIN HFA) 108 (90 Base) MCG/ACT inhaler Inhale 2 puffs into the lungs every 6 (six) hours as needed for wheezing or shortness of breath. 1 Inhaler 1  . amoxicillin-clavulanate (AUGMENTIN) 875-125 MG tablet Take 1 tablet by mouth 2 (two) times daily. 20 tablet 0  . benzonatate (TESSALON) 100 MG capsule Take 1-2 capsules (100-200 mg total) by mouth 3 (three)  times daily as needed for cough. 40 capsule 0  . predniSONE (DELTASONE) 20 MG tablet Take 2 tablets (40 mg total) by mouth daily with breakfast. 14 tablet 0  . psyllium (REGULOID) 0.52 g capsule Take 0.52 g by mouth daily. 2 caps    . ranitidine (ZANTAC) 150 MG tablet Take 150 mg by mouth daily.     No current facility-administered medications for this visit.     Allergies:  Allergies  Allergen Reactions  . Codeine Nausea Only    Past Surgical History:  Procedure Laterality Date  . ARTHROSCOPIC KNEE    . CESAREAN SECTION    . COLONOSCOPY    . ENDOMETRIAL ABLATION    . GANGLION CYST EXCISION    . TUBAL LIGATION      Social History   Social History  . Marital status: Married    Spouse name: N/A  . Number of children: N/A  . Years of education: N/A   Occupational History  . internet support    Social History Main Topics  . Smoking status: Former Smoker    Packs/day: 0.50    Years: 20.00    Types: Cigarettes    Quit date: 10/08/2011  . Smokeless tobacco: Never Used     Comment: VAPOR  . Alcohol use 0.0 oz/week     Comment: rare  . Drug use: No  .  Sexual activity: Yes    Partners: Male   Other Topics Concern  . None   Social History Narrative   Exercise-- daily for 1 hour    ROS See hpi Objective: Vitals:   09/02/17 1702  BP: 108/72  Pulse: 79  Resp: 16  Temp: 98.6 F (37 C)  TempSrc: Oral  SpO2: 98%  Weight: 186 lb 3.2 oz (84.5 kg)  Height: 5\' 1"  (1.549 m)    Physical Exam General: alert, oriented, in NAD Head: normocephalic, atraumatic, + sinus tenderness Eyes: EOM intact, no scleral icterus or conjunctival injection Ears: TM clear bilaterally Nose: mucosa nonerythematous, nonedematous Throat: no pharyngeal exudate or erythema Lymph: no posterior auricular, submental or cervical lymph adenopathy Heart: normal rate, normal sinus rhythm, no murmurs Lungs: wheezing in the left lung field   Assessment and Plan Brylinn was seen today for uri  and fever.  Diagnoses and all orders for this visit:  Acute bronchitis with bronchospasm  Seasonal allergic rhinitis due to pollen  Cough -     benzonatate (TESSALON) 100 MG capsule; Take 1-2 capsules (100-200 mg total) by mouth 3 (three) times daily as needed for cough.  Sore throat -     benzonatate (TESSALON) 100 MG capsule; Take 1-2 capsules (100-200 mg total) by mouth 3 (three) times daily as needed for cough.  Other orders -     amoxicillin-clavulanate (AUGMENTIN) 875-125 MG tablet; Take 1 tablet by mouth 2 (two) times daily. -     fluconazole (DIFLUCAN) 150 MG tablet; Take 1 tablet (150 mg total) by mouth once. Take second tablets in 3 days for vaginal itching after antibiotic -     predniSONE (DELTASONE) 20 MG tablet; Take 2 tablets (40 mg total) by mouth daily with breakfast. -     albuterol (PROVENTIL HFA;VENTOLIN HFA) 108 (90 Base) MCG/ACT inhaler; Inhale 2 puffs into the lungs every 6 (six) hours as needed for wheezing or shortness of breath.   Discussed augmentin  Sent in diflucan in case she get yeast vaginitis Also prednisone to reduce bronchospasm Discussed that cough may not resolve for 3 - 6 weeks Supportive care discussed   [place]

## 2017-09-03 MED ORDER — BENZONATATE 100 MG PO CAPS: 100.0000 mg | ORAL_CAPSULE | Freq: Three times a day (TID) | ORAL | 0 refills | Status: DC | PRN

## 2017-09-05 ENCOUNTER — Telehealth: Payer: Self-pay | Admitting: Family Medicine

## 2017-09-05 NOTE — Telephone Encounter (Signed)
Please advise 
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
## 2017-10-31 ENCOUNTER — Ambulatory Visit: Payer: Self-pay

## 2017-10-31 NOTE — Telephone Encounter (Signed)
Pt. called with c/o redness, tenderness and swelling right side of face, since she got up this AM.  Stated the area goes from right temple, down the right side of face, and into the neck.  Reported she just started using her CPAP machine, and questions if the CPAP could have caused this reaction?  Reported the redness has increased over the course of the day.  Stated it is very tender to touch. Denied any toothache.  Denied any swelling of throat, difficulty breathing, or difficulty swallowing.  Denied fever.  Advised to go to the [facility], per protocol.  Pt. Verb. Understanding and agreed with plan.      Reason for Disposition . [1] Swelling is red AND [2] very painful to touch  Answer Assessment - Initial Assessment Questions 1. ONSET: "When did the swelling start?" (e.g., minutes, hours, days)     This AM when she woke up 2. LOCATION: "What part of the face is swollen?"     Right side of face from temple region to the chin/ jaw area3. SEVERITY: "How swollen is it?"     Mild to moderate 4. ITCHING: "Is there any itching?" If so, ask: "How much?"   (Scale 1-10; mild, moderate or severe)     no 5. PAIN: "Is the swelling painful to touch?" If so, ask: "How painful is it?"   (Scale 1-10; mild, moderate or severe)     Sore to touch  6. FEVER: "Do you have a fever?" If so, ask: "What is it, how was it measured, and when did it start?"    No 7. CAUSE: "What do you think is causing the face swelling?"     Unknown 8. RECURRENT SYMPTOM: "Have you had face swelling before?" If so, ask: "When was the last time?" "What happened that time?"    no 9. OTHER SYMPTOMS: "Do you have any other symptoms?" (e.g., toothache, leg swelling)     No/ no toothache  10. PREGNANCY: "Is there any chance you are pregnant?" "When was your last menstrual period?"       *No Answer*  Protocols used: [facility]

## 2017-11-02 ENCOUNTER — Encounter (HOSPITAL_COMMUNITY): Payer: Self-pay | Admitting: Emergency Medicine

## 2017-11-02 ENCOUNTER — Emergency Department (HOSPITAL_COMMUNITY): Payer: Managed Care, Other (non HMO)

## 2017-11-02 ENCOUNTER — Other Ambulatory Visit: Payer: Self-pay

## 2017-11-02 ENCOUNTER — Emergency Department (HOSPITAL_COMMUNITY)
Admission: EM | Admit: 2017-11-02 | Discharge: 2017-11-02 | Disposition: A | Discharge status: Home / Self Care | Payer: Managed Care, Other (non HMO) | Attending: Physician Assistant | Admitting: Physician Assistant

## 2017-11-02 DIAGNOSIS — R220 Localized swelling, mass and lump, head: Secondary | ICD-10-CM | POA: Diagnosis present

## 2017-11-02 DIAGNOSIS — Z87891 Personal history of nicotine dependence: Secondary | ICD-10-CM | POA: Insufficient documentation

## 2017-11-02 DIAGNOSIS — F329 Major depressive disorder, single episode, unspecified: Secondary | ICD-10-CM | POA: Insufficient documentation

## 2017-11-02 DIAGNOSIS — F419 Anxiety disorder, unspecified: Secondary | ICD-10-CM | POA: Insufficient documentation

## 2017-11-02 DIAGNOSIS — Z79899 Other long term (current) drug therapy: Secondary | ICD-10-CM | POA: Insufficient documentation

## 2017-11-02 DIAGNOSIS — I10 Essential (primary) hypertension: Secondary | ICD-10-CM | POA: Diagnosis not present

## 2017-11-02 DIAGNOSIS — L03213 Periorbital cellulitis: Secondary | ICD-10-CM | POA: Insufficient documentation

## 2017-11-02 LAB — CBC WITH DIFFERENTIAL/PLATELET
BASOS PCT: 0 %
Basophils Absolute: 0 10*3/uL (ref 0.0–0.1)
EOS ABS: 0.2 10*3/uL (ref 0.0–0.7)
EOS PCT: 2 %
HCT: 38.6 % (ref 36.0–46.0)
HEMOGLOBIN: 12.7 g/dL (ref 12.0–15.0)
Lymphocytes Relative: 33 %
Lymphs Abs: 3 10*3/uL (ref 0.7–4.0)
MCH: 28.9 pg (ref 26.0–34.0)
MCHC: 32.9 g/dL (ref 30.0–36.0)
MCV: 87.7 fL (ref 78.0–100.0)
MONOS PCT: 9 %
Monocytes Absolute: 0.8 10*3/uL (ref 0.1–1.0)
NEUTROS PCT: 56 %
Neutro Abs: 5 10*3/uL (ref 1.7–7.7)
PLATELETS: 259 10*3/uL (ref 150–400)
RBC: 4.4 MIL/uL (ref 3.87–5.11)
RDW: 12.9 % (ref 11.5–15.5)
WBC: 9 10*3/uL (ref 4.0–10.5)

## 2017-11-02 LAB — BASIC METABOLIC PANEL
Anion gap: 8 (ref 5–15)
BUN: 14 mg/dL (ref 6–20)
CALCIUM: 9.1 mg/dL (ref 8.9–10.3)
CHLORIDE: 108 mmol/L (ref 101–111)
CO2: 22 mmol/L (ref 22–32)
CREATININE: 0.84 mg/dL (ref 0.44–1.00)
Glucose, Bld: 107 mg/dL — ABNORMAL HIGH (ref 65–99)
Potassium: 4.2 mmol/L (ref 3.5–5.1)
SODIUM: 138 mmol/L (ref 135–145)

## 2017-11-02 MED ORDER — IOPAMIDOL (ISOVUE-300) INJECTION 61%
INTRAVENOUS | Status: AC
Start: 2017-11-02 — End: 2017-11-02
  Administered 2017-11-02: 100 mL via INTRAVENOUS
  Filled 2017-11-02: qty 100

## 2017-11-02 MED ORDER — AMOXICILLIN-POT CLAVULANATE 875-125 MG PO TABS: 1.0000 | ORAL_TABLET | Freq: Two times a day (BID) | ORAL | 0 refills | Status: DC

## 2017-11-02 MED ORDER — FLUORESCEIN SODIUM 1 MG OP STRP
1.0000 | ORAL_STRIP | Freq: Once | OPHTHALMIC | Status: AC
  Administered 2017-11-02: 1 via OPHTHALMIC
  Filled 2017-11-02: qty 1

## 2017-11-02 MED ORDER — TETRACAINE HCL 0.5 % OP SOLN
2.0000 [drp] | Freq: Once | OPHTHALMIC | Status: AC
  Administered 2017-11-02: 2 [drp] via OPHTHALMIC
  Filled 2017-11-02: qty 4

## 2017-11-02 NOTE — ED Triage Notes (Addendum)
Pt presents to ED for assessment of right sided facial swelling, eye pain, and cellulitis.  Pt states she woke up Thursday morning with the redness, but it has continued spreading.  Pt states she may have been bitten by a bug.  Pt c/o some blurred peripheral vision and when she woke up today her eye was almost swollen shut.  Pt was seen at [facility] and given Valtrex, stating it may be shingles.  Redness continued to spread, so she went to another urgent care, where they treated her for cellulitis, with a shot of rocephin, toradol, and then sent home with prescriptions for sulfameth.  No improvement.

## 2017-11-02 NOTE — Discharge Instructions (Signed)
The CT scan of your head showed preseptal cellulitis.  This is infection and inflammation in the anterior structures of the face and eyes.  No deep infection (orbital cellulitis.)  I have written you a prescription for Augmentin.  Take it twice a day for the next 7 days.  Please take this in addition to Bactrim.   Please take all of your antibiotics until finished!   You may develop abdominal discomfort or diarrhea from the antibiotic.  You may help offset this with probiotics which you can buy or get in yogurt. Do not eat  or take the probiotics until 2 hours after your antibiotic.   Return to the emergency department if you have worsening spreading of the redness and swelling beyond the markings put on in the emergency department.  Please also return if you develop a fever greater than 100.4 F.

## 2017-11-02 NOTE — ED Notes (Signed)
Called lab and estimate of 10-15 minutes for BMET result

## 2017-11-02 NOTE — ED Provider Notes (Signed)
[facility] EMERGENCY DEPARTMENT Provider Note   CSN: [id number] Arrival date & time: 11/02/17  1542     History   Chief Complaint Chief Complaint  Patient presents with  . Eye Pain    HPI Robin Small is a 56 y.o. female.  HPI   Robin Small is a 56 year old female with a history of anxiety, depression, asthma, hyperlipidemia who presents the emergency department for evaluation of right-sided facial swelling and redness.  Patient states that she noticed her right cheek was swollen, red and tender when she woke up 2 days ago.  She was seen at the urgent care, told that her tenderness and swelling was along the nerve and diagnosed her with shingles.  They gave her Valtrex.  She states that the next day the redness and swelling spread around her eye.  She went to another urgent care where they diagnosed her with cellulitis and gave her a shot of Rocephin and sent her home with Bactrim.  She has taken 2 doses of Bactrim, but states that she thinks that the swelling is worse around her right eye despite taking antibiotics.  At this time she states that she has mild to 5/10 severity aching pain behind her right eye.  She also complains of soreness on her right cheek. She tried warm compresses over the eye which is helped with the swelling some.  She also endorses some blurred vision in her right eye.  She wears corrective lenses, does not wear contacts.  No history of eye surgery in the past.  Denies recent trauma to the eye, or anything getting stuck in the eye.  She denies pain with extraocular movements.  Denies fever, headache, nausea/vomiting, congestion, purulent nasal secretions, sinus pain, ear pain, sore throat, cough.     Past Medical History:  Diagnosis Date  . Allergy    seasonal allergies  . Arthritis   . Depression   . High cholesterol   . Hypertension   . Personal history of colonic polyps 04/06/2011  . PPD positive    Age 27 >> reports taking medicine  for one year    Patient Active Problem List   Diagnosis Date Noted  . Sinusitis, acute 08/24/2013  . Vesicular rash 08/24/2013  . Dyspnea 01/08/2013  . OSA (obstructive sleep apnea) 01/08/2013  . Muscle ache 02/08/2012  . Personal history of colonic polyps 04/06/2011  . HEMATOCHEZIA 02/26/2011  . HEMORRHOIDS, INTERNAL 12/27/2010  . EXTERNAL HEMORRHOIDS 12/27/2010  . Mild intermittent asthma 06/03/2010  . LUMBAR SPRAIN AND STRAIN 07/20/2008  . ANXIETY DEPRESSION 06/28/2008  . HYPERLIPIDEMIA 07/10/2007  . DEPRESSION 05/29/2007    Past Surgical History:  Procedure Laterality Date  . ARTHROSCOPIC KNEE    . CESAREAN SECTION    . COLONOSCOPY    . ENDOMETRIAL ABLATION    . GANGLION CYST EXCISION    . TUBAL LIGATION      OB History    No data available       Home Medications    Prior to Admission medications   Medication Sig Start Date End Date Taking? Authorizing Provider  albuterol (PROVENTIL HFA;VENTOLIN HFA) 108 (90 Base) MCG/ACT inhaler Inhale 2 puffs into the lungs every 6 (six) hours as needed for wheezing or shortness of breath. 09/02/17   Forrest Moron, MD  amoxicillin-clavulanate (AUGMENTIN) 875-125 MG tablet Take 1 tablet by mouth 2 (two) times daily. 09/02/17   Forrest Moron, MD  benzonatate (TESSALON) 100 MG capsule Take 1-2 capsules (  100-200 mg total) by mouth 3 (three) times daily as needed for cough. 09/03/17   Forrest Moron, MD  Calcium Carbonate-Vitamin D (CALCIUM-D) 600-400 MG-UNIT TABS Take by mouth.    [provider]  cetirizine (ZYRTEC) 10 MG tablet Take 1 tablet (10 mg total) by mouth daily. 05/23/16   Jaynee Eagles, PA-C  clonazePAM (KLONOPIN) 0.5 MG tablet TAKE 1 TABLET BY MOUTH THREE TIMES DAILY AS NEEDED FOR ANXIETY 08/02/14   Carollee Herter, Alferd Apa, DO  glucosamine-chondroitin 500-400 MG tablet Take 1 tablet by mouth 2 (two) times daily.    [provider]  loratadine (CLARITIN) 10 MG tablet Take 10 mg by mouth daily. 1 DAILY       [provider]  mometasone (NASONEX) 50 MCG/ACT nasal spray Place 2 sprays into the nose daily. 05/23/16   Jaynee Eagles, PA-C  psyllium (REGULOID) 0.52 g capsule Take 0.52 g by mouth daily. 2 caps    [provider]  ranitidine (ZANTAC) 150 MG tablet Take 150 mg by mouth daily.    [provider]    Family History Family History  Problem Relation Age of Onset  . Colon polyps Mother   . Diabetes Mother   . Hypertension Mother   . Heart disease Mother   . Hyperlipidemia Mother   . Alcohol abuse Brother   . Hepatitis Brother        hep c  . Breast cancer Maternal Grandmother        breast  . Lung cancer Father   . Colon cancer Neg Hx     Social History Social History   Tobacco Use  . Smoking status: Former Smoker    Packs/day: 0.50    Years: 20.00    Pack years: 10.00    Types: Cigarettes    Last attempt to quit: 10/08/2011    Years since quitting: 6.0  . Smokeless tobacco: Never Used  . Tobacco comment: VAPOR  Substance Use Topics  . Alcohol use: Yes    Alcohol/week: 0.0 oz    Comment: rare  . Drug use: No     Allergies   Codeine   Review of Systems Review of Systems  Constitutional: Negative for chills, fatigue and fever.  HENT: Positive for facial swelling (right sided). Negative for congestion, dental problem, ear pain, sinus pressure, sinus pain, sore throat and trouble swallowing.   Eyes: Positive for visual disturbance (mild blurred vision right eye). Negative for pain, discharge and redness.  Respiratory: Negative for cough and shortness of breath.   Gastrointestinal: Negative for nausea and vomiting.  Musculoskeletal: Negative for neck pain.  Skin: Positive for color change (red on right side of face).  Neurological: Negative for headaches.  All other systems reviewed and are negative.    Physical Exam Updated Vital Signs BP 106/64 (BP Location: Right Arm)   Pulse 69   Temp 98.1 F (36.7 C) (Oral)   Resp 16   SpO2  98%   Physical Exam  Constitutional: She is oriented to person, place, and time. She appears well-developed and well-nourished. No distress.  HENT:  Head: Normocephalic and atraumatic.    Right Ear: External ear normal.  Left Ear: External ear normal.  Mouth/Throat: Oropharynx is clear and moist. No oropharyngeal exudate.  Mild erythema and swelling noted in the right cheek and periorbital area. It is warm and tender to the touch. No induration or fluctuance. No vesicles. Bilateral TMs with good cone of light. Nose with clear rhinorrhea in  the nasal cavity.   Eyes: Right eye exhibits no discharge. Left eye exhibits no discharge.  Visual acuity 20/30LE, 20/50RE, 20/30BE. PERRL. No conjunctival injection. EOM intact, no pain with EOM. No proptosis. Fundoscopic exam: optic disc with sharp margins.   Neck: Normal range of motion. Neck supple.  Cardiovascular: Normal rate, regular rhythm and intact distal pulses. Exam reveals no friction rub.  No murmur heard. Pulmonary/Chest: Effort normal and breath sounds normal. No stridor. No respiratory distress. She has no wheezes. She has no rales.  Abdominal: Soft. Bowel sounds are normal. There is no tenderness. There is no guarding.  Musculoskeletal: Normal range of motion.  Lymphadenopathy:    She has no cervical adenopathy.  Neurological: She is alert and oriented to person, place, and time. Coordination normal.  Skin: Skin is warm and dry. She is not diaphoretic.  Psychiatric: She has a normal mood and affect. Her behavior is normal.  Nursing note and vitals reviewed.    ED Treatments / Results  Labs (all labs ordered are listed, but only abnormal results are displayed) Labs Reviewed - No data to display  EKG  EKG Interpretation None       Radiology No results found.  Procedures Procedures (including critical care time)  Medications Ordered in ED Medications  tetracaine (PONTOCAINE) 0.5 % ophthalmic solution 2 drop (2 drops  Right Eye Given 11/02/17 1647)  fluorescein ophthalmic strip 1 strip (1 strip Right Eye Given 11/02/17 1646)     Initial Impression / Assessment and Plan / ED Course  I have reviewed the triage vital signs and the nursing notes.  Pertinent labs & imaging results that were available during my care of the patient were reviewed by me and considered in my medical decision making (see chart for details).     Presents with right-sided facial swelling and redness which has spread over the past 2 days.  She went to urgent care and was given a dose of Rocephin and started on Bactrim yesterday.  She is afebrile, nontoxic-appearing.  Exam is consistent with preseptal cellulitis.  Do not suspect orbital cellulitis given no tenderness with extraocular movements, no proptosis or headache.  Basic labs reveal CBC within normal limits, no leukocytosis. BMP unremarkable.  CT maxillofacial scan reveals preseptal cellulitis on the right side, no abscess or post-septal inflammation. No sinusitis.    Have marked the area of redness and swelling with marker in the ER.  Will add Augmentin to her current Bactrim antibiotic treatment. Have given patient strict return precautions and she agrees. Patients vital signs stable and she has no complaints prior to discharge. Discussed this plan with Dr. Julious Oka who agrees.  Final Clinical Impressions(s) / ED Diagnoses   Final diagnoses:  Preseptal cellulitis    ED Discharge Orders    None       Bernarda Caffey 11/02/17 2035    Macarthur Critchley, MD 11/02/17 [phone]

## 2017-11-02 NOTE — ED Notes (Signed)
Pt understood dc material. NAD noted. Script given at dc  

## 2017-12-07 ENCOUNTER — Other Ambulatory Visit: Payer: Self-pay | Admitting: Family Medicine

## 2017-12-30 ENCOUNTER — Other Ambulatory Visit: Payer: Self-pay | Admitting: Family Medicine

## 2017-12-30 NOTE — Telephone Encounter (Signed)
Refill request for proair hfa 108 approved with no refills. Dgaddy, CMA

## 2018-02-24 ENCOUNTER — Telehealth: Payer: Self-pay | Admitting: Internal Medicine

## 2018-02-24 NOTE — Telephone Encounter (Signed)
Left message for patient to call back  

## 2018-02-26 NOTE — Telephone Encounter (Signed)
Left message for patient to call back if she still has questions or concerns

## 2018-06-26 ENCOUNTER — Ambulatory Visit: Payer: Managed Care, Other (non HMO) | Admitting: Family Medicine

## 2018-11-28 ENCOUNTER — Other Ambulatory Visit: Payer: Self-pay | Admitting: Family Medicine

## 2018-11-28 NOTE — Telephone Encounter (Signed)
Requested medication (s) are due for refill today: n/a  Requested medication (s) are on the active medication list: Yes  Last refill:  12/30/17  Future visit scheduled: No  Notes to clinic:  Left message to call and schedule an appointment.    Requested Prescriptions  Pending Prescriptions Disp Refills   albuterol (PROVENTIL HFA;VENTOLIN HFA) 108 (90 Base) MCG/ACT inhaler [Pharmacy Med Name: ALBUTEROL HFA INH (200 PUFFS) 8.5GM] 8.5 g 0    Sig: INHALE 2 PUFFS INTO THE LUNGS EVERY 6 HOURS AS NEEDED FOR WHEEZING OR SHORTNESS OF BREATH     Pulmonology:  Beta Agonists Failed - 11/28/2018  9:36 AM      Failed - One inhaler should last at least one month. If the patient is requesting refills earlier, contact the patient to check for uncontrolled symptoms.      Failed - Valid encounter within last 12 months    Recent Outpatient Visits          1 year ago Acute bronchitis with bronchospasm   Primary Care at [facility], Arlie Solomons, MD   2 years ago Acute sinusitis, recurrence not specified, unspecified location   Primary Care at [place], [place]

## 2019-01-30 DIAGNOSIS — M25562 Pain in left knee: Secondary | ICD-10-CM | POA: Diagnosis not present

## 2019-01-30 DIAGNOSIS — M461 Sacroiliitis, not elsewhere classified: Secondary | ICD-10-CM | POA: Diagnosis not present

## 2019-01-30 DIAGNOSIS — M25561 Pain in right knee: Secondary | ICD-10-CM | POA: Diagnosis not present

## 2019-02-07 DIAGNOSIS — M1990 Unspecified osteoarthritis, unspecified site: Secondary | ICD-10-CM | POA: Diagnosis not present

## 2019-02-13 ENCOUNTER — Ambulatory Visit (INDEPENDENT_AMBULATORY_CARE_PROVIDER_SITE_OTHER): Payer: BLUE CROSS/BLUE SHIELD | Admitting: Internal Medicine

## 2019-02-13 ENCOUNTER — Encounter: Payer: Self-pay | Admitting: Internal Medicine

## 2019-02-13 VITALS — BP 124/78 | HR 66 | Ht 61.0 in | Wt 175.2 lb

## 2019-02-13 DIAGNOSIS — J302 Other seasonal allergic rhinitis: Secondary | ICD-10-CM | POA: Insufficient documentation

## 2019-02-13 DIAGNOSIS — Z23 Encounter for immunization: Secondary | ICD-10-CM | POA: Diagnosis not present

## 2019-02-13 DIAGNOSIS — G4733 Obstructive sleep apnea (adult) (pediatric): Secondary | ICD-10-CM

## 2019-02-13 DIAGNOSIS — J3089 Other allergic rhinitis: Secondary | ICD-10-CM

## 2019-02-13 MED ORDER — ZOLPIDEM TARTRATE 10 MG PO TABS: 10.0000 mg | ORAL_TABLET | Freq: Every evening | ORAL | 1 refills | Status: DC | PRN

## 2019-02-13 NOTE — Patient Instructions (Signed)
Order- new DME, replace old CPAP machine, auto 5-20, mask of choice, humidifier, supplies, AirView   Script for Ambien sent to [facility]  Order- Flu shot standard

## 2019-02-13 NOTE — Assessment & Plan Note (Addendum)
She has been compliant with CPAP and recognizes benefit with improved sleep and prevention of snoring.  She has been managing with her self- made filters, with repaired mask and hoses.  She needs a new machine and she wants to establish with a different DME. Plan-we will replace old CPAP auto 5-20

## 2019-02-13 NOTE — Assessment & Plan Note (Addendum)
She manages with non-sedating otc antihistamines. If rhinitis interferes with CPAP use we will see what else can help.

## 2019-02-13 NOTE — Progress Notes (Signed)
02/13/2019-58 year old female former smoker for sleep evaluation.  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 Problem list includes mild intermittent asthma, anxiety depression, hyperlipidemia, low back pain, NPSG 02/19/13- AHI 57.8/ hr, desaturation to 63%, body weight 195 lbs, CPAP titrated to 10. -----Former VS pt last seen 03/12/2013.  Pt states she has been doing good. States she is needing a new CPAP mask and supplies and that is why she is here for the consult. Pt states she does feel rested when she wakes up in the morning. PSG 02/19/13 >> AHI 57.8, SpO2 low 63%; CPAP 10 cm H2O >> AHI 5.7, +R, +S; PLMI 64.2>>0. PFT 03/12/13 >> FEV1 2.77 (119%), FEV1% 77, TLC 5.35 (117%), DLCO 86%, no BD She started CPAP [facility] but never followed up here and bought her machine outright. Needs new mask and also pending knee surgery. Epworth score 0 endorsed by patient She had some difficulties with [employer] related to billing when she sought to buy her original machine.  Has continued to use CPAP regularly and sleeps much better with it.  Controls her snoring.  She has been making filters and making repairs with duct tape but mask is no longer working adequately for her. Seasonal allergic rhinitis managed with Claritin or Zyrtec.  No ENT surgery.  Denies lower respiratory problems or heart condition. Has been using Ambien 10 mg, one half or 1, most nights, without which she only gets a couple hours of sleep.  She says this has been well-tolerated. Discussed sleep hygiene.  Prior to Admission medications   Medication Sig Start Date End Date Taking? Authorizing Provider  Calcium Carbonate-Vitamin D (CALCIUM-D) 600-400 MG-UNIT TABS Take by mouth.   Yes [provider]  cetirizine (ZYRTEC) 10 MG tablet Take 1 tablet (10 mg total) by mouth daily. 05/23/16  Yes Jaynee Eagles, PA-C  co-enzyme Q-10 50 MG capsule Take 50 mg by mouth daily.   Yes [provider]  ibuprofen (ADVIL,MOTRIN) 200 MG tablet Take 200 mg by mouth  every 6 (six) hours as needed.   Yes [provider]  loratadine (CLARITIN) 10 MG tablet Take 10 mg by mouth daily. 1 DAILY     Yes [provider]  pravastatin (PRAVACHOL) 20 MG tablet TK 1 T PO QD 12/03/18  Yes [provider]  PROAIR HFA 108 (90 Base) MCG/ACT inhaler INHALE 2 PUFFS INTO THE LUNGS EVERY 6 HOURS AS NEEDED FOR WHEEZING OR SHORTNESS OF BREATH 12/30/17  Yes Stallings, Zoe A, MD  zolpidem (AMBIEN) 10 MG tablet Take 1 tablet (10 mg total) by mouth at bedtime as needed for sleep. 02/13/19 05/14/19 Yes Viktor Philipp, Tarri Fuller D, MD  glucosamine-chondroitin 500-400 MG tablet Take 1 tablet by mouth 2 (two) times daily.    [provider]   Past Medical History:  Diagnosis Date  . Allergy    seasonal allergies  . Arthritis   . Depression   . High cholesterol   . Hypertension   . Personal history of colonic polyps 04/06/2011  . PPD positive    Age 19 >> reports taking medicine for one year   Past Surgical History:  Procedure Laterality Date  . ARTHROSCOPIC KNEE    . CESAREAN SECTION    . COLONOSCOPY    . ENDOMETRIAL ABLATION    . GANGLION CYST EXCISION    . TUBAL LIGATION     Family History  Problem Relation Age of Onset  . Colon polyps Mother   . Diabetes Mother   . Hypertension Mother   .  Heart disease Mother   . Hyperlipidemia Mother   . Alcohol abuse Brother   . Hepatitis Brother        hep c  . Breast cancer Maternal Grandmother        breast  . Lung cancer Father   . Colon cancer Neg Hx    Social History   Socioeconomic History  . Marital status: Married    Spouse name: Not on file  . Number of children: Not on file  . Years of education: Not on file  . Highest education level: Not on file  Occupational History  . Occupation: internet support  Social Needs  . Financial resource strain: Not on file  . Food insecurity:    Worry: Not on file    Inability: Not on file  . Transportation needs:    Medical: Not on file     Non-medical: Not on file  Tobacco Use  . Smoking status: Former Smoker    Packs/day: 0.50    Years: 20.00    Pack years: 10.00    Types: Cigarettes    Last attempt to quit: 10/08/2011    Years since quitting: 7.3  . Smokeless tobacco: Never Used  Substance and Sexual Activity  . Alcohol use: Yes    Alcohol/week: 0.0 standard drinks    Comment: rare  . Drug use: No  . Sexual activity: Yes    Partners: Male  Lifestyle  . Physical activity:    Days per week: Not on file    Minutes per session: Not on file  . Stress: Not on file  Relationships  . Social connections:    Talks on phone: Not on file    Gets together: Not on file    Attends religious service: Not on file    Active member of club or organization: Not on file    Attends meetings of clubs or organizations: Not on file    Relationship status: Not on file  . Intimate partner violence:    Fear of current or ex partner: Not on file    Emotionally abused: Not on file    Physically abused: Not on file    Forced sexual activity: Not on file  Other Topics Concern  . Not on file  Social History Narrative   Exercise-- daily for 1 hour   ROS-see HPI   + = positive Constitutional:    weight loss, night sweats, fevers, chills, fatigue, lassitude. HEENT:    headaches, difficulty swallowing, tooth/dental problems, sore throat,       sneezing, itching, ear ache, +nasal congestion, post nasal drip, snoring CV:    chest pain, orthopnea, PND, swelling in lower extremities, anasarca,                                  dizziness, palpitations Resp:   shortness of breath with exertion or at rest.                productive cough,   non-productive cough, coughing up of blood.              change in color of mucus.  wheezing.   Skin:    rash or lesions. GI:  No-   heartburn, indigestion, abdominal pain, nausea, vomiting, diarrhea,                 change in bowel habits, loss of appetite GU: dysuria, change in  color of urine, no urgency  or frequency.   flank pain. MS:  + joint pain, stiffness, decreased range of motion, back pain. Neuro-     nothing unusual Psych:  change in mood or affect.  depression or anxiety.   memory loss.  OBJ- Physical Exam General- Alert, Oriented, Affect-appropriate, Distress- none acute, + obese Skin- rash-none, lesions- none, excoriation- none Lymphadenopathy- none Head- atraumatic            Eyes- Gross vision intact, PERRLA, conjunctivae and secretions clear            Ears- Hearing, canals-normal            Nose- + stuffy, no-Septal dev, mucus, polyps, erosion, perforation             Throat- Mallampati IV , mucosa clear , drainage- none, tonsils- atrophic Neck- flexible , trachea midline, no stridor , thyroid nl, carotid no bruit Chest - symmetrical excursion , unlabored           Heart/CV- RRR , no murmur , no gallop  , no rub, nl s1 s2                           - JVD- none , edema- none, stasis changes- none, varices- none           Lung- clear to P&A, wheeze- none, cough- none , dullness-none, rub- none           Chest wall-  Abd-  Br/ Gen/ Rectal- Not done, not indicated Extrem- cyanosis- none, clubbing, none, atrophy- none, strength- nl Neuro- grossly intact to observation

## 2019-02-17 DIAGNOSIS — Z7901 Long term (current) use of anticoagulants: Secondary | ICD-10-CM | POA: Diagnosis not present

## 2019-02-17 DIAGNOSIS — Z01818 Encounter for other preprocedural examination: Secondary | ICD-10-CM | POA: Diagnosis not present

## 2019-02-17 DIAGNOSIS — Z131 Encounter for screening for diabetes mellitus: Secondary | ICD-10-CM | POA: Diagnosis not present

## 2019-02-17 DIAGNOSIS — E559 Vitamin D deficiency, unspecified: Secondary | ICD-10-CM | POA: Diagnosis not present

## 2019-02-17 DIAGNOSIS — M25562 Pain in left knee: Secondary | ICD-10-CM | POA: Diagnosis not present

## 2019-02-17 DIAGNOSIS — R05 Cough: Secondary | ICD-10-CM | POA: Diagnosis not present

## 2019-02-17 DIAGNOSIS — R0602 Shortness of breath: Secondary | ICD-10-CM | POA: Diagnosis not present

## 2019-02-17 DIAGNOSIS — Z114 Encounter for screening for human immunodeficiency virus [HIV]: Secondary | ICD-10-CM | POA: Diagnosis not present

## 2019-02-19 ENCOUNTER — Telehealth: Payer: Self-pay | Admitting: Internal Medicine

## 2019-02-19 NOTE — Telephone Encounter (Signed)
Called [facility], Robin Small. Robin Small stated that they needed sleep study.  She stated that messages have been sent to [facility] with [facility].  Informed her [name] had not been in the office last or this week.  Robin Small spoke with [name].  [name] stated that he received sleep study today.  Patient should be contacted Fri. or Mon. after insurance approval.  Left message with Patient to call back.

## 2019-02-20 NOTE — Telephone Encounter (Signed)
Called and left message for Patient to call back. 

## 2019-02-23 NOTE — Telephone Encounter (Signed)
ATC pt, no answer. Left message for pt to call back.  

## 2019-02-24 NOTE — Telephone Encounter (Signed)
Called patient, unable to reach. Left message to give [place] a call back. Called James with [employer]. Spoke with Mable Fill, she stated that [name] was currently out of the office this morning amnd she would have him give [place] a call back.

## 2019-02-25 ENCOUNTER — Other Ambulatory Visit: Payer: Self-pay | Admitting: Orthopaedic Surgery

## 2019-02-26 ENCOUNTER — Other Ambulatory Visit: Payer: Self-pay

## 2019-02-26 ENCOUNTER — Encounter (HOSPITAL_COMMUNITY)
Admission: RE | Admit: 2019-02-26 | Discharge: 2019-02-26 | Disposition: A | Discharge status: Home / Self Care | Payer: BLUE CROSS/BLUE SHIELD | Source: Ambulatory Visit | Attending: Orthopaedic Surgery | Admitting: Orthopaedic Surgery

## 2019-02-26 ENCOUNTER — Other Ambulatory Visit: Payer: Self-pay | Admitting: Orthopaedic Surgery

## 2019-02-26 ENCOUNTER — Encounter (HOSPITAL_COMMUNITY): Payer: Self-pay

## 2019-02-26 DIAGNOSIS — Z01812 Encounter for preprocedural laboratory examination: Secondary | ICD-10-CM | POA: Insufficient documentation

## 2019-02-26 HISTORY — DX: Obstructive sleep apnea (adult) (pediatric): G47.33

## 2019-02-26 HISTORY — DX: Personal history of urinary calculi: Z87.442

## 2019-02-26 HISTORY — DX: Lichen sclerosus et atrophicus: L90.0

## 2019-02-26 HISTORY — DX: Diverticulosis of intestine, part unspecified, without perforation or abscess without bleeding: K57.90

## 2019-02-26 HISTORY — DX: Dependence on other enabling machines and devices: Z99.89

## 2019-02-26 HISTORY — DX: Nonspecific low blood-pressure reading: R03.1

## 2019-02-26 LAB — SURGICAL PCR SCREEN
MRSA, PCR: NEGATIVE
Staphylococcus aureus: NEGATIVE

## 2019-02-26 LAB — ABO/RH: ABO/RH(D): A POS

## 2019-02-26 NOTE — Telephone Encounter (Signed)
Attempted to call pt but unable to reach. Left message for pt to return call. 

## 2019-02-26 NOTE — Pre-Procedure Instructions (Addendum)
Robin Small stated she had congestion and was treated with tamiflu 02/16/2019 even though flu swab was negative.  No coughing or fever noted during pre op appointment today. Completed antibiotic 02/23/2019.  The following are in hard chart: EKG 02/17/2019 CBC/diff, CMP, UA, Urine Culture, Hgb A1C (6.2) 02/17/2019 PT 02/18/2019

## 2019-02-27 ENCOUNTER — Other Ambulatory Visit: Payer: Self-pay | Admitting: Orthopaedic Surgery

## 2019-02-27 NOTE — Telephone Encounter (Signed)
Patient is returning phone call.  Patient would like to get a new mask for CPAP machine. Patient phone number is [phone].  May leave detailed message on voicemail.,

## 2019-02-27 NOTE — Telephone Encounter (Signed)
Spoke to [facility] w/ [employer]. & states she spoke to the pt on the 11th.  At that time, pt stated she didn't know if she would want to use her previous DME for supplies or not.  Marysa advised she will reach out to the pt again today.

## 2019-02-27 NOTE — H&P (Signed)
TOTAL KNEE ADMISSION H&P  Patient is being admitted for right total knee arthroplasty.  Subjective:  Chief Complaint:right knee pain.  HPI: Robin Small, 58 y.o. female, has a history of pain and functional disability in the right knee due to arthritis and has failed non-surgical conservative treatments for greater than 12 weeks to includeNSAID's and/or analgesics, corticosteriod injections, flexibility and strengthening excercises, use of assistive devices, weight reduction as appropriate and activity modification.  Onset of symptoms was gradual, starting 5 years ago with gradually worsening course since that time. The patient noted no past surgery on the right knee(s).  Patient currently rates pain in the right knee(s) at 10 out of 10 with activity. Patient has night pain, worsening of pain with activity and weight bearing, pain that interferes with activities of daily living, crepitus and joint swelling.  Patient has evidence of subchondral cysts, subchondral sclerosis, periarticular osteophytes and joint space narrowing by imaging studies. There is no active infection.  Patient Active Problem List   Diagnosis Date Noted  . Seasonal and perennial allergic rhinitis 02/13/2019  . Sinusitis, acute 08/24/2013  . Vesicular rash 08/24/2013  . Dyspnea 01/08/2013  . OSA (obstructive sleep apnea) 01/08/2013  . Muscle ache 02/08/2012  . Personal history of colonic polyps 04/06/2011  . HEMATOCHEZIA 02/26/2011  . HEMORRHOIDS, INTERNAL 12/27/2010  . EXTERNAL HEMORRHOIDS 12/27/2010  . Mild intermittent asthma 06/03/2010  . LUMBAR SPRAIN AND STRAIN 07/20/2008  . ANXIETY DEPRESSION 06/28/2008  . HYPERLIPIDEMIA 07/10/2007  . DEPRESSION 05/29/2007   Past Medical History:  Diagnosis Date  . Allergy    seasonal allergies  . Arthritis   . Depression   . Diverticulosis 03/2016   Mild, noted on colonoscopy  . High cholesterol   . History of kidney stones   . Hypertension   . Lichen sclerosus     Anus  . Low blood pressure reading   . OSA on CPAP   . Personal history of colonic polyps 04/06/2011  . PPD positive    Age 14 >> reports taking medicine for one year    Past Surgical History:  Procedure Laterality Date  . ARTHROSCOPIC KNEE    . CESAREAN SECTION    . COLONOSCOPY W/ POLYPECTOMY  04/11/2016  . ENDOMETRIAL ABLATION    . GANGLION CYST EXCISION Right   . lichen sclerosus lesion excision     Anus  . TUBAL LIGATION      No current facility-administered medications for this encounter.    Current Outpatient Medications  Medication Sig Dispense Refill Last Dose  . Calcium Carbonate-Vitamin D (CALCIUM-D) 600-400 MG-UNIT TABS Take 2 tablets by mouth at bedtime.    Taking  . clobetasol cream (TEMOVATE) 5.40 % Apply 1 application topically 2 (two) times daily as needed (skin irritation).     Marland Kitchen co-enzyme Q-10 50 MG capsule Take 50 mg by mouth daily.   Taking  . ibuprofen (ADVIL,MOTRIN) 200 MG tablet Take 200 mg by mouth every 6 (six) hours as needed.   Taking  . loratadine (CLARITIN) 10 MG tablet Take 10 mg by mouth daily as needed for allergies.    Taking  . Multiple Vitamins-Minerals (AIRBORNE PO) Take 2 tablets by mouth daily.     Marland Kitchen PROAIR HFA 108 (90 Base) MCG/ACT inhaler INHALE 2 PUFFS INTO THE LUNGS EVERY 6 HOURS AS NEEDED FOR WHEEZING OR SHORTNESS OF BREATH (Patient taking differently: Inhale 2 puffs into the lungs every 6 (six) hours as needed for wheezing or shortness of breath. ) 8.5  g 0 Taking  . zolpidem (AMBIEN) 10 MG tablet Take 1 tablet (10 mg total) by mouth at bedtime as needed for sleep. (Patient taking differently: Take 10 mg by mouth at bedtime. ) 90 tablet 1    Allergies  Allergen Reactions  . Codeine Nausea Only    Social History   Tobacco Use  . Smoking status: Former Smoker    Packs/day: 0.50    Years: 30.00    Pack years: 15.00    Types: Cigarettes    Last attempt to quit: 10/08/2011    Years since quitting: 7.3  . Smokeless tobacco: Never Used   Substance Use Topics  . Alcohol use: Yes    Alcohol/week: 0.0 standard drinks    Comment: rare    Family History  Problem Relation Age of Onset  . Colon polyps Mother   . Diabetes Mother   . Hypertension Mother   . Heart disease Mother   . Hyperlipidemia Mother   . Alcohol abuse Brother   . Hepatitis Brother        hep c  . Breast cancer Maternal Grandmother        breast  . Lung cancer Father   . Colon cancer Neg Hx      Review of Systems  Musculoskeletal: Positive for joint pain.       Right knee  All other systems reviewed and are negative.   Objective:  Physical Exam  Constitutional: She is oriented to person, place, and time. She appears well-developed and well-nourished.  HENT:  Head: Normocephalic and atraumatic.  Eyes: Pupils are equal, round, and reactive to light.  Neck: Normal range of motion.  Cardiovascular: Normal rate and regular rhythm.  Respiratory: Effort normal.  GI: Soft.  Musculoskeletal:     Comments: Both knees move about 0-120.  She has medial greater than lateral joint line pain and crepitation.  Hip motion is full and straight leg raise is negative.  Sensation and motor function are intact on both sides with palpable pulses in both feet.  Neurological: She is alert and oriented to person, place, and time.  Skin: Skin is warm and dry.  Psychiatric: She has a normal mood and affect. Her behavior is normal. Judgment and thought content normal.    Vital signs in last 24 hours: Temp:  [98.9 F (37.2 C)] 98.9 F (37.2 C) (03/12 0945) Pulse Rate:  [58] 58 (03/12 0945) Resp:  [18] 18 (03/12 0945) BP: (113)/(73) 113/73 (03/12 0945) SpO2:  [97 %] 97 % (03/12 0945) Weight:  [78.2 kg] 78.2 kg (03/12 0945)  Labs:   Estimated body mass index is 32.04 kg/m as calculated from the following:   Height as of 02/26/19: 5' 1.5" (1.562 m).   Weight as of 02/26/19: 78.2 kg.   Imaging Review Plain radiographs demonstrate severe degenerative joint  disease of the right knee(s). The overall alignment isneutral. The bone quality appears to be good for age and reported activity level.      Assessment/Plan:  End stage primary arthritis, right knee   The patient history, physical examination, clinical judgment of the provider and imaging studies are consistent with end stage degenerative joint disease of the right knee(s) and total knee arthroplasty is deemed medically necessary. The treatment options including medical management, injection therapy arthroscopy and arthroplasty were discussed at length. The risks and benefits of total knee arthroplasty were presented and reviewed. The risks due to aseptic loosening, infection, stiffness, patella tracking problems, thromboembolic complications and other  imponderables were discussed. The patient acknowledged the explanation, agreed to proceed with the plan and consent was signed. Patient is being admitted for inpatient treatment for surgery, pain control, PT, OT, prophylactic antibiotics, VTE prophylaxis, progressive ambulation and ADL's and discharge planning. The patient is planning to be discharged home with home health services     Patient's anticipated LOS is less than 2 midnights, meeting these requirements: - Younger than 76 - Lives within 1 hour of care - Has a competent adult at home to recover with post-op recover - NO history of  - Chronic pain requiring opiods  - Diabetes  - Coronary Artery Disease  - Heart failure  - Heart attack  - Stroke  - DVT/VTE  - Cardiac arrhythmia  - Respiratory Failure/COPD  - Renal failure  - Anemia  - Advanced Liver disease

## 2019-02-27 NOTE — Telephone Encounter (Signed)
Referral already placed for replacement mask so will forward to [facility]'s per protocol to check on this thanks

## 2019-03-02 ENCOUNTER — Telehealth (HOSPITAL_COMMUNITY): Payer: Self-pay | Admitting: *Deleted

## 2019-03-02 ENCOUNTER — Other Ambulatory Visit: Payer: Self-pay | Admitting: Orthopaedic Surgery

## 2019-03-02 NOTE — Telephone Encounter (Signed)
Called patient, unable to reach. Left message to give us a call back.  

## 2019-03-03 ENCOUNTER — Encounter (HOSPITAL_COMMUNITY)
Admission: RE | Disposition: A | Discharge status: Home / Self Care | Payer: Self-pay | Source: Home / Self Care | Attending: Orthopaedic Surgery

## 2019-03-03 ENCOUNTER — Observation Stay (HOSPITAL_COMMUNITY)
Admission: RE | Admit: 2019-03-03 | Discharge: 2019-03-04 | Disposition: A | Discharge status: Home / Self Care | Payer: BLUE CROSS/BLUE SHIELD | Attending: Orthopaedic Surgery | Admitting: Orthopaedic Surgery

## 2019-03-03 ENCOUNTER — Encounter (HOSPITAL_COMMUNITY): Payer: Self-pay | Admitting: Emergency Medicine

## 2019-03-03 ENCOUNTER — Ambulatory Visit (HOSPITAL_COMMUNITY): Payer: BLUE CROSS/BLUE SHIELD | Admitting: Anesthesiology

## 2019-03-03 ENCOUNTER — Ambulatory Visit (HOSPITAL_COMMUNITY): Payer: BLUE CROSS/BLUE SHIELD | Admitting: Physician Assistant

## 2019-03-03 ENCOUNTER — Other Ambulatory Visit: Payer: Self-pay

## 2019-03-03 DIAGNOSIS — Z791 Long term (current) use of non-steroidal anti-inflammatories (NSAID): Secondary | ICD-10-CM | POA: Insufficient documentation

## 2019-03-03 DIAGNOSIS — F329 Major depressive disorder, single episode, unspecified: Secondary | ICD-10-CM | POA: Diagnosis not present

## 2019-03-03 DIAGNOSIS — R262 Difficulty in walking, not elsewhere classified: Secondary | ICD-10-CM | POA: Insufficient documentation

## 2019-03-03 DIAGNOSIS — M170 Bilateral primary osteoarthritis of knee: Principal | ICD-10-CM | POA: Insufficient documentation

## 2019-03-03 DIAGNOSIS — Z8249 Family history of ischemic heart disease and other diseases of the circulatory system: Secondary | ICD-10-CM | POA: Diagnosis not present

## 2019-03-03 DIAGNOSIS — I10 Essential (primary) hypertension: Secondary | ICD-10-CM | POA: Insufficient documentation

## 2019-03-03 DIAGNOSIS — Z87891 Personal history of nicotine dependence: Secondary | ICD-10-CM | POA: Insufficient documentation

## 2019-03-03 DIAGNOSIS — Z01818 Encounter for other preprocedural examination: Secondary | ICD-10-CM

## 2019-03-03 DIAGNOSIS — Z885 Allergy status to narcotic agent status: Secondary | ICD-10-CM | POA: Diagnosis not present

## 2019-03-03 DIAGNOSIS — M1712 Unilateral primary osteoarthritis, left knee: Secondary | ICD-10-CM | POA: Diagnosis not present

## 2019-03-03 DIAGNOSIS — R2689 Other abnormalities of gait and mobility: Secondary | ICD-10-CM | POA: Insufficient documentation

## 2019-03-03 DIAGNOSIS — Z79899 Other long term (current) drug therapy: Secondary | ICD-10-CM | POA: Insufficient documentation

## 2019-03-03 DIAGNOSIS — E7800 Pure hypercholesterolemia, unspecified: Secondary | ICD-10-CM | POA: Diagnosis not present

## 2019-03-03 DIAGNOSIS — F419 Anxiety disorder, unspecified: Secondary | ICD-10-CM | POA: Diagnosis not present

## 2019-03-03 DIAGNOSIS — G4733 Obstructive sleep apnea (adult) (pediatric): Secondary | ICD-10-CM | POA: Diagnosis not present

## 2019-03-03 DIAGNOSIS — G8918 Other acute postprocedural pain: Secondary | ICD-10-CM | POA: Diagnosis not present

## 2019-03-03 HISTORY — PX: TOTAL KNEE ARTHROPLASTY: SHX125

## 2019-03-03 LAB — TYPE AND SCREEN
ABO/RH(D): A POS
Antibody Screen: NEGATIVE

## 2019-03-03 SURGERY — ARTHROPLASTY, KNEE, TOTAL
Anesthesia: Spinal | Site: Knee | Laterality: Left

## 2019-03-03 MED ORDER — METHOCARBAMOL 500 MG PO TABS
500.0000 mg | ORAL_TABLET | Freq: Four times a day (QID) | ORAL | Status: DC | PRN
  Administered 2019-03-04: 500 mg via ORAL
  Filled 2019-03-03: qty 1

## 2019-03-03 MED ORDER — MIDAZOLAM HCL 2 MG/2ML IJ SOLN
1.0000 mg | INTRAMUSCULAR | Status: DC
  Administered 2019-03-03: 1 mg via INTRAVENOUS

## 2019-03-03 MED ORDER — CEFAZOLIN SODIUM-DEXTROSE 2-4 GM/100ML-% IV SOLN: 2.0000 g | INTRAVENOUS | Status: DC

## 2019-03-03 MED ORDER — TRANEXAMIC ACID-NACL 1000-0.7 MG/100ML-% IV SOLN: 1000.0000 mg | INTRAVENOUS | Status: DC

## 2019-03-03 MED ORDER — ONDANSETRON HCL 4 MG/2ML IJ SOLN: 4.0000 mg | Freq: Four times a day (QID) | INTRAMUSCULAR | Status: DC | PRN

## 2019-03-03 MED ORDER — LORATADINE 10 MG PO TABS: 10.0000 mg | ORAL_TABLET | Freq: Every day | ORAL | Status: DC | PRN

## 2019-03-03 MED ORDER — DIPHENHYDRAMINE HCL 12.5 MG/5ML PO ELIX: 12.5000 mg | ORAL_SOLUTION | ORAL | Status: DC | PRN

## 2019-03-03 MED ORDER — SODIUM CHLORIDE 0.9 % IR SOLN
Status: DC | PRN
  Administered 2019-03-03: 1000 mL

## 2019-03-03 MED ORDER — METHOCARBAMOL 500 MG IVPB - SIMPLE MED
INTRAVENOUS | Status: AC
  Filled 2019-03-03: qty 50

## 2019-03-03 MED ORDER — FENTANYL CITRATE (PF) 100 MCG/2ML IJ SOLN
INTRAMUSCULAR | Status: AC
  Filled 2019-03-03: qty 2

## 2019-03-03 MED ORDER — PROMETHAZINE HCL 25 MG/ML IJ SOLN: 6.2500 mg | INTRAMUSCULAR | Status: DC | PRN

## 2019-03-03 MED ORDER — CHLORHEXIDINE GLUCONATE 4 % EX LIQD: 60.0000 mL | Freq: Once | CUTANEOUS | Status: DC

## 2019-03-03 MED ORDER — METOCLOPRAMIDE HCL 5 MG/ML IJ SOLN: 5.0000 mg | Freq: Three times a day (TID) | INTRAMUSCULAR | Status: DC | PRN

## 2019-03-03 MED ORDER — DEXAMETHASONE SODIUM PHOSPHATE 10 MG/ML IJ SOLN
INTRAMUSCULAR | Status: AC
  Filled 2019-03-03: qty 1

## 2019-03-03 MED ORDER — HYDROCODONE-ACETAMINOPHEN 5-325 MG PO TABS
1.0000 | ORAL_TABLET | ORAL | Status: DC | PRN
  Administered 2019-03-03 – 2019-03-04 (×2): 2 via ORAL
  Filled 2019-03-03 (×2): qty 2

## 2019-03-03 MED ORDER — MIDAZOLAM HCL 2 MG/2ML IJ SOLN
INTRAMUSCULAR | Status: AC
  Administered 2019-03-03: 1 mg via INTRAVENOUS
  Filled 2019-03-03: qty 2

## 2019-03-03 MED ORDER — LACTATED RINGERS IV SOLN: INTRAVENOUS | Status: DC

## 2019-03-03 MED ORDER — ARTIFICIAL TEARS OPHTHALMIC OINT
TOPICAL_OINTMENT | OPHTHALMIC | Status: AC
  Filled 2019-03-03: qty 3.5

## 2019-03-03 MED ORDER — ASPIRIN EC 325 MG PO TBEC
325.0000 mg | DELAYED_RELEASE_TABLET | Freq: Two times a day (BID) | ORAL | Status: DC
  Administered 2019-03-04: 325 mg via ORAL
  Filled 2019-03-03: qty 1

## 2019-03-03 MED ORDER — TRANEXAMIC ACID-NACL 1000-0.7 MG/100ML-% IV SOLN
1000.0000 mg | INTRAVENOUS | Status: AC
  Administered 2019-03-03: 1000 mg via INTRAVENOUS
  Filled 2019-03-03: qty 100

## 2019-03-03 MED ORDER — PHENYLEPHRINE 40 MCG/ML (10ML) SYRINGE FOR IV PUSH (FOR BLOOD PRESSURE SUPPORT)
PREFILLED_SYRINGE | INTRAVENOUS | Status: DC | PRN
  Administered 2019-03-03 (×4): 80 ug via INTRAVENOUS

## 2019-03-03 MED ORDER — EPHEDRINE 5 MG/ML INJ
INTRAVENOUS | Status: AC
  Filled 2019-03-03: qty 10

## 2019-03-03 MED ORDER — HYDROCODONE-ACETAMINOPHEN 7.5-325 MG PO TABS
1.0000 | ORAL_TABLET | ORAL | Status: DC | PRN
  Administered 2019-03-03 – 2019-03-04 (×3): 2 via ORAL
  Filled 2019-03-03 (×3): qty 2

## 2019-03-03 MED ORDER — CEFAZOLIN SODIUM-DEXTROSE 2-4 GM/100ML-% IV SOLN
2.0000 g | INTRAVENOUS | Status: AC
  Administered 2019-03-03: 2 g via INTRAVENOUS
  Filled 2019-03-03: qty 100

## 2019-03-03 MED ORDER — BUPIVACAINE-EPINEPHRINE (PF) 0.25% -1:200000 IJ SOLN
INTRAMUSCULAR | Status: DC | PRN
  Administered 2019-03-03: 30 mL

## 2019-03-03 MED ORDER — FENTANYL CITRATE (PF) 100 MCG/2ML IJ SOLN
INTRAMUSCULAR | Status: AC
  Administered 2019-03-03: 50 ug via INTRAVENOUS
  Filled 2019-03-03: qty 2

## 2019-03-03 MED ORDER — ACETAMINOPHEN 500 MG PO TABS
500.0000 mg | ORAL_TABLET | Freq: Four times a day (QID) | ORAL | Status: DC
  Administered 2019-03-03: 500 mg via ORAL
  Filled 2019-03-03: qty 1

## 2019-03-03 MED ORDER — STERILE WATER FOR IRRIGATION IR SOLN
Status: DC | PRN
  Administered 2019-03-03: 2000 mL

## 2019-03-03 MED ORDER — FENTANYL CITRATE (PF) 100 MCG/2ML IJ SOLN
50.0000 ug | INTRAMUSCULAR | Status: DC
  Administered 2019-03-03: 50 ug via INTRAVENOUS

## 2019-03-03 MED ORDER — SODIUM CHLORIDE 0.9 % IJ SOLN
INTRAMUSCULAR | Status: DC | PRN
  Administered 2019-03-03: 30 mL

## 2019-03-03 MED ORDER — BUPIVACAINE LIPOSOME 1.3 % IJ SUSP
20.0000 mL | Freq: Once | INTRAMUSCULAR | Status: DC
  Filled 2019-03-03: qty 20

## 2019-03-03 MED ORDER — LACTATED RINGERS IV SOLN
INTRAVENOUS | Status: DC
  Administered 2019-03-03: 16:00:00 via INTRAVENOUS

## 2019-03-03 MED ORDER — LIP MEDEX EX OINT
TOPICAL_OINTMENT | CUTANEOUS | Status: AC
  Administered 2019-03-03: 16:00:00
  Filled 2019-03-03: qty 7

## 2019-03-03 MED ORDER — PHENYLEPHRINE 40 MCG/ML (10ML) SYRINGE FOR IV PUSH (FOR BLOOD PRESSURE SUPPORT)
PREFILLED_SYRINGE | INTRAVENOUS | Status: AC
  Filled 2019-03-03: qty 10

## 2019-03-03 MED ORDER — PROPOFOL 500 MG/50ML IV EMUL
INTRAVENOUS | Status: DC | PRN
  Administered 2019-03-03: 50 ug/kg/min via INTRAVENOUS

## 2019-03-03 MED ORDER — KETOROLAC TROMETHAMINE 15 MG/ML IJ SOLN
15.0000 mg | Freq: Four times a day (QID) | INTRAMUSCULAR | Status: DC
  Administered 2019-03-03 – 2019-03-04 (×3): 15 mg via INTRAVENOUS
  Filled 2019-03-03 (×3): qty 1

## 2019-03-03 MED ORDER — ACETAMINOPHEN 325 MG PO TABS: 325.0000 mg | ORAL_TABLET | Freq: Four times a day (QID) | ORAL | Status: DC | PRN

## 2019-03-03 MED ORDER — FENTANYL CITRATE (PF) 100 MCG/2ML IJ SOLN: 25.0000 ug | INTRAMUSCULAR | Status: DC | PRN

## 2019-03-03 MED ORDER — ALBUTEROL SULFATE (2.5 MG/3ML) 0.083% IN NEBU: 3.0000 mL | INHALATION_SOLUTION | Freq: Four times a day (QID) | RESPIRATORY_TRACT | Status: DC | PRN

## 2019-03-03 MED ORDER — PHENOL 1.4 % MT LIQD
1.0000 | OROMUCOSAL | Status: DC | PRN
  Filled 2019-03-03: qty 177

## 2019-03-03 MED ORDER — METHOCARBAMOL 500 MG IVPB - SIMPLE MED
500.0000 mg | Freq: Four times a day (QID) | INTRAVENOUS | Status: DC | PRN
  Administered 2019-03-03: 500 mg via INTRAVENOUS
  Filled 2019-03-03: qty 50

## 2019-03-03 MED ORDER — OXYCODONE HCL 5 MG PO TABS: 5.0000 mg | ORAL_TABLET | Freq: Once | ORAL | Status: DC | PRN

## 2019-03-03 MED ORDER — DOCUSATE SODIUM 100 MG PO CAPS
100.0000 mg | ORAL_CAPSULE | Freq: Two times a day (BID) | ORAL | Status: DC
  Administered 2019-03-03 – 2019-03-04 (×2): 100 mg via ORAL
  Filled 2019-03-03 (×2): qty 1

## 2019-03-03 MED ORDER — BISACODYL 5 MG PO TBEC: 5.0000 mg | DELAYED_RELEASE_TABLET | Freq: Every day | ORAL | Status: DC | PRN

## 2019-03-03 MED ORDER — MENTHOL 3 MG MT LOZG: 1.0000 | LOZENGE | OROMUCOSAL | Status: DC | PRN

## 2019-03-03 MED ORDER — MORPHINE SULFATE (PF) 2 MG/ML IV SOLN
0.5000 mg | INTRAVENOUS | Status: DC | PRN
  Administered 2019-03-03 (×2): 1 mg via INTRAVENOUS
  Filled 2019-03-03 (×2): qty 1

## 2019-03-03 MED ORDER — BUPIVACAINE LIPOSOME 1.3 % IJ SUSP: 20.0000 mL | Freq: Once | INTRAMUSCULAR | Status: DC

## 2019-03-03 MED ORDER — ONDANSETRON HCL 4 MG PO TABS: 4.0000 mg | ORAL_TABLET | Freq: Four times a day (QID) | ORAL | Status: DC | PRN

## 2019-03-03 MED ORDER — LIDOCAINE 2% (20 MG/ML) 5 ML SYRINGE
INTRAMUSCULAR | Status: DC | PRN
  Administered 2019-03-03: 40 mg via INTRAVENOUS

## 2019-03-03 MED ORDER — EPHEDRINE SULFATE-NACL 50-0.9 MG/10ML-% IV SOSY
PREFILLED_SYRINGE | INTRAVENOUS | Status: DC | PRN
  Administered 2019-03-03: 10 mg via INTRAVENOUS
  Administered 2019-03-03: 5 mg via INTRAVENOUS

## 2019-03-03 MED ORDER — PROPOFOL 10 MG/ML IV BOLUS
INTRAVENOUS | Status: AC
  Filled 2019-03-03: qty 40

## 2019-03-03 MED ORDER — TRANEXAMIC ACID 1000 MG/10ML IV SOLN
INTRAVENOUS | Status: DC | PRN
  Administered 2019-03-03: 2000 mg via TOPICAL

## 2019-03-03 MED ORDER — BUPIVACAINE-EPINEPHRINE (PF) 0.25% -1:200000 IJ SOLN
INTRAMUSCULAR | Status: AC
  Filled 2019-03-03: qty 30

## 2019-03-03 MED ORDER — ALUM & MAG HYDROXIDE-SIMETH 200-200-20 MG/5ML PO SUSP: 30.0000 mL | ORAL | Status: DC | PRN

## 2019-03-03 MED ORDER — BUPIVACAINE LIPOSOME 1.3 % IJ SUSP
INTRAMUSCULAR | Status: DC | PRN
  Administered 2019-03-03: 20 mL

## 2019-03-03 MED ORDER — PROPOFOL 10 MG/ML IV BOLUS
INTRAVENOUS | Status: AC
  Filled 2019-03-03: qty 20

## 2019-03-03 MED ORDER — TRANEXAMIC ACID 1000 MG/10ML IV SOLN: 2000.0000 mg | INTRAVENOUS | Status: DC

## 2019-03-03 MED ORDER — SODIUM CHLORIDE (PF) 0.9 % IJ SOLN
INTRAMUSCULAR | Status: AC
  Filled 2019-03-03: qty 50

## 2019-03-03 MED ORDER — ONDANSETRON HCL 4 MG/2ML IJ SOLN
INTRAMUSCULAR | Status: AC
  Filled 2019-03-03: qty 2

## 2019-03-03 MED ORDER — LACTATED RINGERS IV SOLN
INTRAVENOUS | Status: DC
  Administered 2019-03-03: 11:00:00 via INTRAVENOUS

## 2019-03-03 MED ORDER — BUPIVACAINE IN DEXTROSE 0.75-8.25 % IT SOLN
INTRATHECAL | Status: DC | PRN
  Administered 2019-03-03: 1.6 mL via INTRATHECAL

## 2019-03-03 MED ORDER — OXYCODONE HCL 5 MG/5ML PO SOLN: 5.0000 mg | Freq: Once | ORAL | Status: DC | PRN

## 2019-03-03 MED ORDER — DEXAMETHASONE SODIUM PHOSPHATE 10 MG/ML IJ SOLN
INTRAMUSCULAR | Status: DC | PRN
  Administered 2019-03-03: 4 mg via INTRAVENOUS

## 2019-03-03 MED ORDER — FENTANYL CITRATE (PF) 100 MCG/2ML IJ SOLN
INTRAMUSCULAR | Status: DC | PRN
  Administered 2019-03-03: 50 ug via INTRAVENOUS

## 2019-03-03 MED ORDER — METOCLOPRAMIDE HCL 5 MG PO TABS: 5.0000 mg | ORAL_TABLET | Freq: Three times a day (TID) | ORAL | Status: DC | PRN

## 2019-03-03 MED ORDER — CEFAZOLIN SODIUM-DEXTROSE 2-4 GM/100ML-% IV SOLN
2.0000 g | Freq: Four times a day (QID) | INTRAVENOUS | Status: AC
  Administered 2019-03-03 – 2019-03-04 (×2): 2 g via INTRAVENOUS
  Filled 2019-03-03 (×2): qty 100

## 2019-03-03 MED ORDER — ONDANSETRON HCL 4 MG/2ML IJ SOLN
INTRAMUSCULAR | Status: DC | PRN
  Administered 2019-03-03: 4 mg via INTRAVENOUS

## 2019-03-03 MED ORDER — ACETAMINOPHEN 10 MG/ML IV SOLN: 1000.0000 mg | Freq: Once | INTRAVENOUS | Status: DC | PRN

## 2019-03-03 MED ORDER — SODIUM CHLORIDE 0.9 % IR SOLN
Status: DC | PRN
  Administered 2019-03-03: 3000 mL

## 2019-03-03 MED ORDER — TRANEXAMIC ACID-NACL 1000-0.7 MG/100ML-% IV SOLN
1000.0000 mg | Freq: Once | INTRAVENOUS | Status: AC
  Administered 2019-03-03: 1000 mg via INTRAVENOUS
  Filled 2019-03-03: qty 100

## 2019-03-03 MED ORDER — TRANEXAMIC ACID 1000 MG/10ML IV SOLN
2000.0000 mg | INTRAVENOUS | Status: DC
  Filled 2019-03-03: qty 20

## 2019-03-03 MED ORDER — BUPIVACAINE-EPINEPHRINE (PF) 0.5% -1:200000 IJ SOLN
INTRAMUSCULAR | Status: DC | PRN
  Administered 2019-03-03: 15 mL via PERINEURAL

## 2019-03-03 MED ORDER — ZOLPIDEM TARTRATE 5 MG PO TABS: 5.0000 mg | ORAL_TABLET | Freq: Every evening | ORAL | Status: DC | PRN

## 2019-03-03 SURGICAL SUPPLY — 62 items
ATTUNE PSFEM LTSZ5 NARCEM KNEE (Femur) ×2 IMPLANT
ATTUNE PSRP INSR SZ5 8 KNEE (Insert) ×1 IMPLANT
ATTUNE PSRP INSR SZ5 8MM KNEE (Insert) ×1 IMPLANT
BAG DECANTER FOR FLEXI CONT (MISCELLANEOUS) ×3 IMPLANT
BAG SPEC THK2 15X12 ZIP CLS (MISCELLANEOUS) ×1
BAG ZIPLOCK 12X15 (MISCELLANEOUS) ×3 IMPLANT
BANDAGE ACE 6X5 VEL STRL LF (GAUZE/BANDAGES/DRESSINGS) ×3 IMPLANT
BASEPLATE TIBIAL ROTATING SZ 4 (Knees) ×2 IMPLANT
BLADE SAGITTAL 25.0X1.19X90 (BLADE) ×2 IMPLANT
BLADE SAGITTAL 25.0X1.19X90MM (BLADE) ×1
BLADE SAW SGTL 13.0X1.19X90.0M (BLADE) ×3 IMPLANT
BOWL SMART MIX CTS (DISPOSABLE) ×3 IMPLANT
BSPLAT TIB 4 CMNT ROT PLAT STR (Knees) ×1 IMPLANT
CEMENT HV SMART SET (Cement) ×6 IMPLANT
CHLORAPREP W/TINT 26ML (MISCELLANEOUS) ×6 IMPLANT
COVER SURGICAL LIGHT HANDLE (MISCELLANEOUS) ×3 IMPLANT
COVER WAND RF STERILE (DRAPES) IMPLANT
CUFF TOURN SGL QUICK 34 (TOURNIQUET CUFF) ×3
CUFF TRNQT CYL 34X4.125X (TOURNIQUET CUFF) ×1 IMPLANT
DECANTER SPIKE VIAL GLASS SM (MISCELLANEOUS) ×6 IMPLANT
DRAPE SHEET LG 3/4 BI-LAMINATE (DRAPES) ×3 IMPLANT
DRAPE TOP 10253 STERILE (DRAPES) ×3 IMPLANT
DRAPE U-SHAPE 47X51 STRL (DRAPES) ×3 IMPLANT
DRSG AQUACEL AG ADV 3.5X10 (GAUZE/BANDAGES/DRESSINGS) ×5 IMPLANT
ELECT REM PT RETURN 15FT ADLT (MISCELLANEOUS) ×3 IMPLANT
GLOVE BIO SURGEON STRL SZ7.5 (GLOVE) ×2 IMPLANT
GLOVE BIO SURGEON STRL SZ8 (GLOVE) ×6 IMPLANT
GLOVE BIOGEL PI IND STRL 7.0 (GLOVE) IMPLANT
GLOVE BIOGEL PI IND STRL 7.5 (GLOVE) IMPLANT
GLOVE BIOGEL PI IND STRL 8 (GLOVE) ×2 IMPLANT
GLOVE BIOGEL PI IND STRL 9 (GLOVE) IMPLANT
GLOVE BIOGEL PI INDICATOR 7.0 (GLOVE) ×2
GLOVE BIOGEL PI INDICATOR 7.5 (GLOVE) ×2
GLOVE BIOGEL PI INDICATOR 8 (GLOVE) ×4
GLOVE BIOGEL PI INDICATOR 9 (GLOVE) ×2
GLOVE ECLIPSE 8.5 STRL (GLOVE) ×2 IMPLANT
GOWN SPEC L3 XXLG W/TWL (GOWN DISPOSABLE) ×2 IMPLANT
GOWN STRL REUS W/TWL LRG LVL3 (GOWN DISPOSABLE) ×2 IMPLANT
GOWN STRL REUS W/TWL XL LVL3 (GOWN DISPOSABLE) ×6 IMPLANT
HANDPIECE INTERPULSE COAX TIP (DISPOSABLE) ×3
HOLDER FOLEY CATH W/STRAP (MISCELLANEOUS) ×2 IMPLANT
HOOD PEEL AWAY FLYTE STAYCOOL (MISCELLANEOUS) ×9 IMPLANT
KIT TURNOVER KIT A (KITS) ×2 IMPLANT
MANIFOLD NEPTUNE II (INSTRUMENTS) ×3 IMPLANT
NS IRRIG 1000ML POUR BTL (IV SOLUTION) ×3 IMPLANT
PACK TOTAL KNEE CUSTOM (KITS) ×3 IMPLANT
PAD ARMBOARD 7.5X6 YLW CONV (MISCELLANEOUS) ×3 IMPLANT
PATELLA MEDIAL ATTUN 35MM KNEE (Knees) ×2 IMPLANT
PIN STEINMAN FIXATION KNEE (PIN) ×2 IMPLANT
PIN THREADED HEADED SIGMA (PIN) ×2 IMPLANT
PROTECTOR NERVE ULNAR (MISCELLANEOUS) ×3 IMPLANT
SET HNDPC FAN SPRY TIP SCT (DISPOSABLE) ×1 IMPLANT
SUT VIC AB 0 CT1 36 (SUTURE) ×3 IMPLANT
SUT VIC AB 2-0 CT1 27 (SUTURE) ×3
SUT VIC AB 2-0 CT1 TAPERPNT 27 (SUTURE) ×1 IMPLANT
SUT VIC AB 3-0 CT1 27 (SUTURE) ×3
SUT VIC AB 3-0 CT1 TAPERPNT 27 (SUTURE) ×1 IMPLANT
SUT VLOC 180 0 24IN GS25 (SUTURE) ×3 IMPLANT
TRAY FOLEY CATH 14FRSI W/METER (CATHETERS) ×2 IMPLANT
TRAY FOLEY MTR SLVR 16FR STAT (SET/KITS/TRAYS/PACK) IMPLANT
WATER STERILE IRR 1000ML POUR (IV SOLUTION) ×3 IMPLANT
WRAP KNEE MAXI GEL POST OP (GAUZE/BANDAGES/DRESSINGS) ×3 IMPLANT

## 2019-03-03 NOTE — Op Note (Signed)
PREOP DIAGNOSIS: DJD LEFT KNEE POSTOP DIAGNOSIS:  same PROCEDURE: LEFT TKR ANESTHESIA: Spinal and MAC ATTENDING SURGEON: [name] ASSISTANT: Loni Dolly PA  INDICATIONS FOR PROCEDURE: Robin Small is a 58 y.o. female who has struggled for a long time with pain due to degenerative arthritis of the left knee.  The patient has failed many conservative non-operative measures and at this point has pain which limits the ability to sleep and walk.  The patient is offered total knee replacement.  Informed operative consent was obtained after discussion of possible risks of anesthesia, infection, neurovascular injury, DVT, and death.  The importance of the post-operative rehabilitation protocol to optimize result was stressed extensively with the patient.  SUMMARY OF FINDINGS AND PROCEDURE:  [name] was taken to the operative suite where under the above anesthesia a left knee replacement was performed.  There were advanced degenerative changes and the bone quality was good.  We used the DePuyAttune system and placed size 5 narrow femur, 4 tibia, 35 mm all polyethylene patella, and a size 8 mm spacer.  Loni Dolly PA-C assisted throughout and was invaluable to the completion of the case in that he helped retract and maintain exposure while I placed the components.  He also helped close thereby minimizing OR time.  The patient was admitted for appropriate post-op care to include perioperative antibiotics and mechanical and pharmacologic measures for DVT prophylaxis.  DESCRIPTION OF PROCEDURE:  PAMLA PANGLE was taken to the operative suite where the above anesthesia was applied.  The patient was positioned supine and prepped and draped in normal sterile fashion.  An appropriate time out was performed.  After the administration of kefzol pre-op antibiotic the leg was elevated and exsanguinated and a tourniquet inflated.  A standard longitudinal incision was made on the anterior knee.  Dissection was  carried down to the extensor mechanism.  All appropriate anti-infective measures were used including the pre-operative antibiotic, betadine impregnated drape, and closed hooded exhaust systems for each member of the surgical team.  A medial parapatellar incision was made in the extensor mechanism and the knee cap flipped and the knee flexed.  Some residual meniscal tissues were removed along with any remaining ACL/PCL tissue.  We removed 5 fairly large loose bodies from the knee. A guide was placed on the tibia and a flat cut was made on it's superior surface.  An intramedullary guide was placed in the femur and was utilized to make anterior and posterior cuts creating an appropriate flexion gap.  A second intramedullary guide was placed in the femur to make a distal cut properly balancing the knee with an extension gap equal to the flexion gap.  The three bones sized to the above mentioned sizes and the appropriate guides were placed and utilized.  A trial reduction was done and the knee easily came to full extension and the patella tracked well on flexion.  The trial components were removed and all bones were cleaned with pulsatile lavage and then dried thoroughly.  Cement was mixed and was pressurized onto the bones followed by placement of the aforementioned components.  Excess cement was trimmed and pressure was held on the components until the cement had hardened.  The tourniquet was deflated and a small amount of bleeding was controlled with cautery and pressure.  The knee was irrigated thoroughly.  The extensor mechanism was re-approximated with V-loc suture in running fashion.  The knee was flexed and the repair was solid.  The subcutaneous  tissues were re-approximated with #0 and #2-0 vicryl and the skin closed with a subcuticular stitch and steristrips.  A sterile dressing was applied.  Intraoperative fluids, EBL, and tourniquet time can be obtained from anesthesia records.  DISPOSITION:  The patient  was taken to recovery room in stable condition and admitted for appropriate post-op care to include peri-operative antibiotic and DVT prophylaxis with mechanical and pharmacologic measures.  [name] 03/03/2019, 2:01 PM

## 2019-03-03 NOTE — Anesthesia Preprocedure Evaluation (Signed)
Anesthesia Evaluation  Patient identified by MRN, date of birth, ID band Patient awake    Reviewed: Allergy & Precautions, NPO status , Patient's Chart, lab work & pertinent test results  History of Anesthesia Complications Negative for: history of anesthetic complications  Airway Mallampati: II  TM Distance: >3 FB Neck ROM: Full    Dental  (+) Dental Advisory Given, Missing,    Pulmonary asthma , sleep apnea and Continuous Positive Airway Pressure Ventilation , former smoker,    Pulmonary exam normal breath sounds clear to auscultation       Cardiovascular negative cardio ROS Normal cardiovascular exam Rhythm:Regular Rate:Normal     Neuro/Psych Depression negative neurological ROS     GI/Hepatic negative GI ROS, Neg liver ROS,   Endo/Other  negative endocrine ROS  Renal/GU negative Renal ROS     Musculoskeletal  (+) Arthritis ,   Abdominal   Peds  Hematology negative hematology ROS (+)   Anesthesia Other Findings Day of surgery medications reviewed with the patient.  Reproductive/Obstetrics                             Anesthesia Physical Anesthesia Plan  ASA: II  Anesthesia Plan: Spinal   Post-op Pain Management:  Regional for Post-op pain   Induction:   PONV Risk Score and Plan: 3 and Treatment may vary due to age or medical condition, Ondansetron, Propofol infusion, Dexamethasone and Midazolam  Airway Management Planned: Natural Airway and Simple Face Mask  Additional Equipment:   Intra-op Plan:   Post-operative Plan:   Informed Consent: I have reviewed the patients History and Physical, chart, labs and discussed the procedure including the risks, benefits and alternatives for the proposed anesthesia with the patient or authorized representative who has indicated his/her understanding and acceptance.     Dental advisory given  Plan Discussed with: CRNA  Anesthesia  Plan Comments:         Anesthesia Quick Evaluation

## 2019-03-03 NOTE — Telephone Encounter (Signed)
Called and spoke with Patient.  Patient is currently in hospital recovering from knee surgery.  Patient stated that she was ok using [employer].  Patient stated she really needs a new mask for when she returns home and doesn't care which DME it come from.   Message routed to [facility]

## 2019-03-03 NOTE — Anesthesia Postprocedure Evaluation (Signed)
Anesthesia Post Note  Patient: Robin Small  Procedure(s) Performed: TOTAL KNEE ARTHROPLASTY (Left Knee)     Patient location during evaluation: PACU Anesthesia Type: Spinal Level of consciousness: awake and alert Pain management: pain level controlled Vital Signs Assessment: post-procedure vital signs reviewed and stable Respiratory status: spontaneous breathing, nonlabored ventilation and respiratory function stable Cardiovascular status: blood pressure returned to baseline and stable Postop Assessment: no apparent nausea or vomiting and spinal receding Anesthetic complications: no    Last Vitals:  Vitals:   03/03/19 1445 03/03/19 1500  BP: 97/63 95/62  Pulse: (!) 50 71  Resp: 13 13  Temp:    SpO2: 98% 98%    Last Pain:  Vitals:   03/03/19 1514  TempSrc:   PainSc: [place]

## 2019-03-03 NOTE — Transfer of Care (Signed)
Immediate Anesthesia Transfer of Care Note  Patient: Robin Small  Procedure(s) Performed: TOTAL KNEE ARTHROPLASTY (Left Knee)  Patient Location: PACU  Anesthesia Type:MAC and Spinal  Level of Consciousness: awake, alert , oriented and patient cooperative  Airway & Oxygen Therapy: Patient Spontanous Breathing and Patient connected to face mask oxygen  Post-op Assessment: Report given to RN and Post -op Vital signs reviewed and stable  Post vital signs: Reviewed and stable  Last Vitals:  Vitals Value Taken Time  BP    Temp    Pulse    Resp    SpO2      Last Pain:  Vitals:   03/03/19 1042  TempSrc:   PainSc: 3       Patients Stated Pain Goal: 3 (36/06/77 0340)  Complications: No apparent anesthesia complications

## 2019-03-03 NOTE — Progress Notes (Signed)
Assisted Dr. Lessie Dings with left, ultrasound guided, adductor canal block. Side rails up, monitors on throughout procedure. See vital signs in flow sheet. Tolerated Procedure well.

## 2019-03-03 NOTE — Evaluation (Signed)
Physical Therapy Evaluation Patient Details Name: Robin Small MRN: [id number] DOB: [birth year] Today's Date: 03/03/2019   History of Present Illness  58 yo female s/p L TKR on 03/03/19. PMH includes dyspnea, OSA, hemmorhoids, anxiety, depression, HLD.   Clinical Impression  Pt presents with L knee pain, decreased L knee ROM, difficulty performing mobility tasks, and decreased activity tolerance due to L knee pain. Pt to benefit from acute PT to address deficits. Pt ambulated short hallway distance with RW with min guard assist, verbal cuing for form and safety provided throughout. Pt educated on ankle pumps (20/hour) to perform this afternoon/evening to increase circulation, to pt's tolerance and limited by pain. PT to progress mobility as tolerated, and will continue to follow acutely.         Follow Up Recommendations Follow surgeon's recommendation for DC plan and follow-up therapies;Supervision for mobility/OOB(HHPT)    Equipment Recommendations  None recommended by PT    Recommendations for Other Services       Precautions / Restrictions Precautions Precautions: Fall Restrictions Weight Bearing Restrictions: No LLE Weight Bearing: Weight bearing as tolerated      Mobility  Bed Mobility Overal bed mobility: Needs Assistance Bed Mobility: Supine to Sit     Supine to sit: Min guard;HOB elevated     General bed mobility comments: Min guard for safety. Verbal cuing for sequencing, use of bed rail and increased time.  Transfers Overall transfer level: Needs assistance Equipment used: Rolling walker (2 wheeled) Transfers: Sit to/from Stand Sit to Stand: Min guard;From elevated surface         General transfer comment: Min guard for safety. Verbal cuing for at least one hand on bed when rising for stability. Pt with self-steadying upon standing.   Ambulation/Gait Ambulation/Gait assistance: Min guard;+2 safety/equipment(pt's daughter assisting with IV pole) Gait  Distance (Feet): 20 Feet Assistive device: Rolling walker (2 wheeled) Gait Pattern/deviations: Step-to pattern;Decreased step length - left;Decreased stance time - left;Decreased weight shift to left;Antalgic Gait velocity: decr    General Gait Details: min guard for safety. Verbal cuing for placement in RW, sequencing with step-to gait, turning with RW.   Stairs            Wheelchair Mobility    Modified Rankin (Stroke Patients Only)       Balance Overall balance assessment: Mild deficits observed, not formally tested                                           Pertinent Vitals/Pain Pain Assessment: 0-10 Pain Score: 6  Pain Location: L knee  Pain Descriptors / Indicators: Aching;Sore;Throbbing Pain Intervention(s): Limited activity within patient's tolerance;Premedicated before session;Monitored during session;Repositioned    Home Living Family/patient expects to be discharged to:: Private residence Living Arrangements: Spouse/significant other Available Help at Discharge: Family;Available 24 hours/day(husband is self employed, pt has two daughters that are CNAs that can assist as needed) Type of Home: House Home Access: Stairs to enter Entrance Stairs-Rails: None Entrance Stairs-Number of Steps: 1 Home Layout: One level Home Equipment: Tub bench;Walker - 2 wheels;Bedside commode      Prior Function Level of Independence: Independent               Hand Dominance        Extremity/Trunk Assessment   Upper Extremity Assessment Upper Extremity Assessment: Overall WFL for tasks assessed    Lower Extremity  Assessment Lower Extremity Assessment: Overall WFL for tasks assessed;LLE deficits/detail LLE Deficits / Details: suspected post-surgical weakness; able to perform ankle pumps, quad set, heel slides, SLR without lift assist or quad lag LLE Sensation: WNL    Cervical / Trunk Assessment Cervical / Trunk Assessment: Normal   Communication   Communication: No difficulties  Cognition Arousal/Alertness: Awake/alert Behavior During Therapy: WFL for tasks assessed/performed Overall Cognitive Status: Within Functional Limits for tasks assessed                                        General Comments General comments (skin integrity, edema, etc.): Pt educated on no pillows or bolsters under L knee when resting in order to maintain full knee extension. Pt is concerned about sleeping, as pt is a side sleeper and likes to sleep with a pillow between her knees with her knees bent.     Exercises Total Joint Exercises Goniometric ROM: knee aarom ~5-60*, limited by pain and stiffness   Assessment/Plan    PT Assessment Patient needs continued PT services  PT Problem List Decreased strength;Decreased mobility;Decreased safety awareness;Decreased range of motion;Decreased activity tolerance;Decreased cognition;Decreased balance;Decreased knowledge of use of DME       PT Treatment Interventions DME instruction;Functional mobility training;Balance training;Patient/family education;Gait training;Therapeutic activities;Stair training;Therapeutic exercise    PT Goals (Current goals can be found in the Care Plan section)  Acute Rehab PT Goals Patient Stated Goal: get moving again  PT Goal Formulation: With patient Time For Goal Achievement: 03/10/19 Potential to Achieve Goals: Good    Frequency 7X/week   Barriers to discharge        Co-evaluation               AM-PAC PT "6 Clicks" Mobility  Outcome Measure Help needed turning from your back to your side while in a flat bed without using bedrails?: A Little Help needed moving from lying on your back to sitting on the side of a flat bed without using bedrails?: A Little Help needed moving to and from a bed to a chair (including a wheelchair)?: A Little Help needed standing up from a chair using your arms (e.g., wheelchair or bedside chair)?: A  Little Help needed to walk in hospital room?: A Little Help needed climbing 3-5 steps with a railing? : A Little 6 Click Score: 18    End of Session Equipment Utilized During Treatment: Gait belt Activity Tolerance: Patient tolerated treatment well;Patient limited by pain Patient left: in chair;with chair alarm set;with family/visitor present;with call bell/phone within reach;with SCD's reapplied Nurse Communication: Mobility status PT Visit Diagnosis: Other abnormalities of gait and mobility (R26.89);Difficulty in walking, not elsewhere classified (R26.2)    Time: 6767-2094 PT Time Calculation (min) (ACUTE ONLY): 28 min   Charges:   PT Evaluation $PT Eval Low Complexity: 1 Low PT Treatments $Gait Training: 8-22 mins       Julien Girt, PT Acute Rehabilitation Services Pager [phone]  Office [place] 03/03/2019, 7:00 PM

## 2019-03-03 NOTE — Interval H&P Note (Signed)
History and Physical Interval Note:  03/03/2019 11:44 AM  Robin Small  has presented today for surgery, with the diagnosis of Left Knee Degernative Joint Disease.  The various methods of treatment have been discussed with the patient and family. After consideration of risks, benefits and other options for treatment, the patient has consented to  Procedure(s): TOTAL KNEE ARTHROPLASTY (Left) as a surgical intervention.  The patient's history has been reviewed, patient examined, no change in status, stable for surgery.  I have reviewed the patient's chart and labs.  Questions were answered to the patient's satisfaction.     [name]

## 2019-03-03 NOTE — Anesthesia Procedure Notes (Signed)
Spinal  Patient location during procedure: OR Start time: 03/03/2019 12:32 PM End time: 03/03/2019 12:34 PM Staffing Anesthesiologist: Brennan Bailey, MD Performed: anesthesiologist  Preanesthetic Checklist Completed: patient identified, surgical consent, pre-op evaluation, timeout performed, IV checked, risks and benefits discussed and monitors and equipment checked Spinal Block Patient position: sitting Prep: site prepped and draped and DuraPrep Patient monitoring: cardiac monitor, continuous pulse ox and blood pressure Approach: midline Location: L3-4 Injection technique: single-shot Needle Needle type: Pencan  Needle gauge: 24 G Needle length: 9 cm Additional Notes Risks, benefits, and alternative discussed. Patient gave consent to procedure. Prepped and draped in sitting position. Clear CSF obtained after one needle pass. Positive terminal aspiration. No pain or paraesthesias with injection. Patient tolerated procedure well. Vital signs stable. Tawny Asal, MD

## 2019-03-03 NOTE — Anesthesia Procedure Notes (Signed)
Anesthesia Regional Block: Adductor canal block   Pre-Anesthetic Checklist: ,, timeout performed, Correct Patient, Correct Site, Correct Laterality, Correct Procedure, Correct Position, site marked, Risks and benefits discussed, pre-op evaluation,  At surgeon's request and post-op pain management  Laterality: Left  Prep: Maximum Sterile Barrier Precautions used, chloraprep       Needles:  Injection technique: Single-shot  Needle Type: Echogenic Stimulator Needle     Needle Length: 9cm  Needle Gauge: 22     Additional Needles:   Procedures:,,,, ultrasound used (permanent image in chart),,,,  Narrative:  Start time: 03/03/2019 11:43 AM End time: 03/03/2019 11:45 AM Injection made incrementally with aspirations every 5 mL.  Performed by: Personally  Anesthesiologist: Brennan Bailey, MD  Additional Notes: Risks, benefits, and alternative discussed. Patient gave consent for procedure. Patient prepped and draped in sterile fashion. Sedation administered, patient remains easily responsive to voice. Relevant anatomy identified with ultrasound guidance. Local anesthetic given in 5cc increments with no signs or symptoms of intravascular injection. No pain or paraesthesias with injection. Patient monitored throughout procedure with signs of LAST or immediate complications. Tolerated well. Ultrasound image placed in chart.  Tawny Asal, MD

## 2019-03-04 ENCOUNTER — Encounter (HOSPITAL_COMMUNITY): Payer: Self-pay | Admitting: Orthopaedic Surgery

## 2019-03-04 DIAGNOSIS — G4733 Obstructive sleep apnea (adult) (pediatric): Secondary | ICD-10-CM | POA: Diagnosis not present

## 2019-03-04 DIAGNOSIS — F419 Anxiety disorder, unspecified: Secondary | ICD-10-CM | POA: Diagnosis not present

## 2019-03-04 DIAGNOSIS — M1712 Unilateral primary osteoarthritis, left knee: Secondary | ICD-10-CM | POA: Diagnosis not present

## 2019-03-04 DIAGNOSIS — M1990 Unspecified osteoarthritis, unspecified site: Secondary | ICD-10-CM | POA: Diagnosis not present

## 2019-03-04 DIAGNOSIS — E7800 Pure hypercholesterolemia, unspecified: Secondary | ICD-10-CM | POA: Diagnosis not present

## 2019-03-04 DIAGNOSIS — M170 Bilateral primary osteoarthritis of knee: Secondary | ICD-10-CM | POA: Diagnosis not present

## 2019-03-04 DIAGNOSIS — R262 Difficulty in walking, not elsewhere classified: Secondary | ICD-10-CM | POA: Diagnosis not present

## 2019-03-04 DIAGNOSIS — F329 Major depressive disorder, single episode, unspecified: Secondary | ICD-10-CM | POA: Diagnosis not present

## 2019-03-04 DIAGNOSIS — Z87891 Personal history of nicotine dependence: Secondary | ICD-10-CM | POA: Diagnosis not present

## 2019-03-04 DIAGNOSIS — Z79899 Other long term (current) drug therapy: Secondary | ICD-10-CM | POA: Diagnosis not present

## 2019-03-04 DIAGNOSIS — Z96652 Presence of left artificial knee joint: Secondary | ICD-10-CM | POA: Diagnosis not present

## 2019-03-04 DIAGNOSIS — Z885 Allergy status to narcotic agent status: Secondary | ICD-10-CM | POA: Diagnosis not present

## 2019-03-04 DIAGNOSIS — Z791 Long term (current) use of non-steroidal anti-inflammatories (NSAID): Secondary | ICD-10-CM | POA: Diagnosis not present

## 2019-03-04 DIAGNOSIS — I10 Essential (primary) hypertension: Secondary | ICD-10-CM | POA: Diagnosis not present

## 2019-03-04 DIAGNOSIS — Z8249 Family history of ischemic heart disease and other diseases of the circulatory system: Secondary | ICD-10-CM | POA: Diagnosis not present

## 2019-03-04 DIAGNOSIS — R2689 Other abnormalities of gait and mobility: Secondary | ICD-10-CM | POA: Diagnosis not present

## 2019-03-04 MED ORDER — HYDROCODONE-ACETAMINOPHEN 5-325 MG PO TABS: 1.0000 | ORAL_TABLET | Freq: Four times a day (QID) | ORAL | 0 refills | Status: DC | PRN

## 2019-03-04 MED ORDER — ASPIRIN 325 MG PO TBEC: 325.0000 mg | DELAYED_RELEASE_TABLET | Freq: Two times a day (BID) | ORAL | 0 refills | Status: DC

## 2019-03-04 MED ORDER — TIZANIDINE HCL 4 MG PO TABS: 4.0000 mg | ORAL_TABLET | Freq: Four times a day (QID) | ORAL | 1 refills | Status: DC | PRN

## 2019-03-04 NOTE — Telephone Encounter (Signed)
Robin Small sent to Robin Small D        She just needs to be scheduled to come in for an appointment. With that appointment if she will bring in her old machine so we can get serial numbers off that machine.    Sending to [place].

## 2019-03-04 NOTE — Discharge Summary (Signed)
Patient ID: Robin Small MRN: [id number] DOB/AGE: Sep 29, 1961 58 y.o.  Admit date: 03/03/2019 Discharge date: 03/04/2019  Admission Diagnoses:  Principal Problem:   Primary osteoarthritis of left knee   Discharge Diagnoses:  Same  Past Medical History:  Diagnosis Date  . Allergy    seasonal allergies  . Arthritis   . Depression   . Diverticulosis 03/2016   Mild, noted on colonoscopy  . High cholesterol   . History of kidney stones   . Hypertension   . Lichen sclerosus    Anus  . Low blood pressure reading   . OSA on CPAP   . Personal history of colonic polyps 04/06/2011  . PPD positive    Age 32 >> reports taking medicine for one year    Surgeries: Procedure(s): TOTAL KNEE ARTHROPLASTY on 03/03/2019   Consultants:   Discharged Condition: Improved  Hospital Course: Robin Small is an 58 y.o. female who was admitted 03/03/2019 for operative treatment ofPrimary osteoarthritis of left knee. Patient has severe unremitting pain that affects sleep, daily activities, and work/hobbies. After pre-op clearance the patient was taken to the operating room on 03/03/2019 and underwent  Procedure(s): TOTAL KNEE ARTHROPLASTY.    Patient was given perioperative antibiotics:  Anti-infectives (From admission, onward)   Start     Dose/Rate Route Frequency Ordered Stop   03/03/19 1800  ceFAZolin (ANCEF) IVPB 2g/100 mL premix     2 g 200 mL/hr over 30 Minutes Intravenous Every 6 hours 03/03/19 1532 03/04/19 0103   03/03/19 1030  ceFAZolin (ANCEF) IVPB 2g/100 mL premix     2 g 200 mL/hr over 30 Minutes Intravenous On call to O.R. 03/03/19 1015 03/03/19 1255   03/03/19 1030  ceFAZolin (ANCEF) IVPB 2g/100 mL premix  Status:  Discontinued     2 g 200 mL/hr over 30 Minutes Intravenous On call to O.R. 03/03/19 1015 03/03/19 1029       Patient was given sequential compression devices, early ambulation, and chemoprophylaxis to prevent DVT.  Patient benefited maximally from hospital stay  and there were no complications.    Recent vital signs:  Patient Vitals for the past 24 hrs:  BP Temp Temp src Pulse Resp SpO2 Height Weight  03/04/19 0631 106/74 - - - - - - -  03/04/19 0433 (!) 92/50 98.1 F (36.7 C) Oral 72 16 96 % - -  03/04/19 0432 (!) 92/50 98.1 F (36.7 C) Oral 72 16 96 % - -  03/04/19 0137 95/61 97.9 F (36.6 C) Oral 76 16 95 % - -  03/03/19 2054 110/76 97.9 F (36.6 C) Oral 60 18 94 % - -  03/03/19 1942 - - - 66 17 97 % - -  03/03/19 1829 107/75 (!) 97.5 F (36.4 C) Oral (!) 57 17 98 % - -  03/03/19 1740 101/71 98.2 F (36.8 C) Axillary (!) 50 16 99 % - -  03/03/19 1639 94/75 (!) 96.9 F (36.1 C) Axillary (!) 54 16 99 % - -  03/03/19 1531 91/67 98.2 F (36.8 C) Oral 69 19 97 % - -  03/03/19 1515 (!) 94/56 (!) 97.4 F (36.3 C) - (!) 51 15 98 % - -  03/03/19 1500 95/62 - - 71 13 98 % - -  03/03/19 1445 97/63 - - (!) 50 13 98 % - -  03/03/19 1430 (!) 94/59 97.9 F (36.6 C) - 69 (!) 22 97 % - -  03/03/19 1147 - - - 60 (!) 24  96 % - -  03/03/19 1146 - - - 60 13 97 % - -  03/03/19 1145 - - - (!) 55 15 97 % - -  03/03/19 1144 - - - 63 17 99 % - -  03/03/19 1143 - - - (!) 54 18 97 % - -  03/03/19 1142 115/72 - - (!) 54 10 99 % - -  03/03/19 1141 - - - (!) 57 12 100 % - -  03/03/19 1042 - - - - - - 5' 1.5" (1.562 m) 78.2 kg  03/03/19 1030 103/68 98.9 F (37.2 C) Oral (!) 56 17 96 % - -     Recent laboratory studies: No results for input(s): WBC, HGB, HCT, PLT, NA, K, CL, CO2, BUN, CREATININE, GLUCOSE, INR, CALCIUM in the last 72 hours.  Invalid input(s): PT, 2   Discharge Medications:   Allergies as of 03/04/2019      Reactions   Codeine Nausea Only      Medication List    STOP taking these medications   ibuprofen 200 MG tablet Commonly known as:  ADVIL,MOTRIN     TAKE these medications   AIRBORNE PO Take 2 tablets by mouth daily.   aspirin 325 MG EC tablet Take 1 tablet (325 mg total) by mouth 2 (two) times daily after a meal.    Calcium-D 600-400 MG-UNIT Tabs Take 2 tablets by mouth at bedtime.   Claritin 10 MG tablet Generic drug:  loratadine Take 10 mg by mouth daily as needed for allergies.   clobetasol cream 0.05 % Commonly known as:  TEMOVATE Apply 1 application topically 2 (two) times daily as needed (skin irritation).   co-enzyme Q-10 50 MG capsule Take 50 mg by mouth daily.   HYDROcodone-acetaminophen 5-325 MG tablet Commonly known as:  NORCO/VICODIN Take 1-2 tablets by mouth every 6 (six) hours as needed for moderate pain (pain score 4-6).   ProAir HFA 108 (90 Base) MCG/ACT inhaler Generic drug:  albuterol INHALE 2 PUFFS INTO THE LUNGS EVERY 6 HOURS AS NEEDED FOR WHEEZING OR SHORTNESS OF BREATH What changed:  See the new instructions.   tiZANidine 4 MG tablet Commonly known as:  Zanaflex Take 1 tablet (4 mg total) by mouth every 6 (six) hours as needed for muscle spasms.   zolpidem 10 MG tablet Commonly known as:  AMBIEN Take 1 tablet (10 mg total) by mouth at bedtime as needed for sleep. What changed:  when to take this            Durable Medical Equipment  (From admission, onward)         Start     Ordered   03/03/19 1533  DME Walker rolling  Once    Question:  Patient needs a walker to treat with the following condition  Answer:  Primary osteoarthritis of left knee   03/03/19 1532   03/03/19 1533  DME 3 n 1  Once     03/03/19 1532   03/03/19 1533  DME Bedside commode  Once    Question:  Patient needs a bedside commode to treat with the following condition  Answer:  Primary osteoarthritis of left knee   03/03/19 1532          Diagnostic Studies: No results found.  Disposition: Discharge disposition: 01-Home or Self Care       Discharge Instructions    Call MD / Call 911   Complete by:  As directed    If you experience  chest pain or shortness of breath, CALL 911 and be transported to the hospital emergency room.  If you develope a fever above 101 F, pus (white  drainage) or increased drainage or redness at the wound, or calf pain, call your surgeon's office.   Constipation Prevention   Complete by:  As directed    Drink plenty of fluids.  Prune juice may be helpful.  You may use a stool softener, such as Colace (over the counter) 100 mg twice a day.  Use MiraLax (over the counter) for constipation as needed.   Diet - low sodium heart healthy   Complete by:  As directed    Discharge instructions   Complete by:  As directed    INSTRUCTIONS AFTER JOINT REPLACEMENT   Remove items at home which could result in a fall. This includes throw rugs or furniture in walking pathways ICE to the affected joint every three hours while awake for 30 minutes at a time, for at least the first 3-5 days, and then as needed for pain and swelling.  Continue to use ice for pain and swelling. You may notice swelling that will progress down to the foot and ankle.  This is normal after surgery.  Elevate your leg when you are not up walking on it.   Continue to use the breathing machine you got in the hospital (incentive spirometer) which will help keep your temperature down.  It is common for your temperature to cycle up and down following surgery, especially at night when you are not up moving around and exerting yourself.  The breathing machine keeps your lungs expanded and your temperature down.   DIET:  As you were doing prior to hospitalization, we recommend a well-balanced diet.  DRESSING / WOUND CARE / SHOWERING  You may shower 3 days after surgery, but keep the wounds dry during showering.  You may use an occlusive plastic wrap (Press'n Seal for example), NO SOAKING/SUBMERGING IN THE BATHTUB.  If the bandage gets wet, change with a clean dry gauze.  If the incision gets wet, pat the wound dry with a clean towel.  ACTIVITY  Increase activity slowly as tolerated, but follow the weight bearing instructions below.   No driving for 6 weeks or until further direction given  by your physician.  You cannot drive while taking narcotics.  No lifting or carrying greater than 10 lbs. until further directed by your surgeon. Avoid periods of inactivity such as sitting longer than an hour when not asleep. This helps prevent blood clots.  You may return to work once you are authorized by your doctor.     WEIGHT BEARING   Weight bearing as tolerated with assist device (walker, cane, etc) as directed, use it as long as suggested by your surgeon or therapist, typically at least 4-6 weeks.   EXERCISES  Results after joint replacement surgery are often greatly improved when you follow the exercise, range of motion and muscle strengthening exercises prescribed by your doctor. Safety measures are also important to protect the joint from further injury. Any time any of these exercises cause you to have increased pain or swelling, decrease what you are doing until you are comfortable again and then slowly increase them. If you have problems or questions, call your caregiver or physical therapist for advice.   Rehabilitation is important following a joint replacement. After just a few days of immobilization, the muscles of the leg can become weakened and shrink (atrophy).  These exercises are  designed to build up the tone and strength of the thigh and leg muscles and to improve motion. Often times heat used for twenty to thirty minutes before working out will loosen up your tissues and help with improving the range of motion but do not use heat for the first two weeks following surgery (sometimes heat can increase post-operative swelling).   These exercises can be done on a training (exercise) mat, on the floor, on a table or on a bed. Use whatever works the best and is most comfortable for you.    Use music or television while you are exercising so that the exercises are a pleasant break in your day. This will make your life better with the exercises acting as a break in your routine  that you can look forward to.   Perform all exercises about fifteen times, three times per day or as directed.  You should exercise both the operative leg and the other leg as well.   Exercises include:   Quad Sets - Tighten up the muscle on the front of the thigh (Quad) and hold for 5-10 seconds.   Straight Leg Raises - With your knee straight (if you were given a brace, keep it on), lift the leg to 60 degrees, hold for 3 seconds, and slowly lower the leg.  Perform this exercise against resistance later as your leg gets stronger.  Leg Slides: Lying on your back, slowly slide your foot toward your buttocks, bending your knee up off the floor (only go as far as is comfortable). Then slowly slide your foot back down until your leg is flat on the floor again.  Angel Wings: Lying on your back spread your legs to the side as far apart as you can without causing discomfort.  Hamstring Strength:  Lying on your back, push your heel against the floor with your leg straight by tightening up the muscles of your buttocks.  Repeat, but this time bend your knee to a comfortable angle, and push your heel against the floor.  You may put a pillow under the heel to make it more comfortable if necessary.   A rehabilitation program following joint replacement surgery can speed recovery and prevent re-injury in the future due to weakened muscles. Contact your doctor or a physical therapist for more information on knee rehabilitation.    CONSTIPATION  Constipation is defined medically as fewer than three stools per week and severe constipation as less than one stool per week.  Even if you have a regular bowel pattern at home, your normal regimen is likely to be disrupted due to multiple reasons following surgery.  Combination of anesthesia, postoperative narcotics, change in appetite and fluid intake all can affect your bowels.   YOU MUST use at least one of the following options; they are listed in order of increasing  strength to get the job done.  They are all available over the counter, and you may need to use some, POSSIBLY even all of these options:    Drink plenty of fluids (prune juice may be helpful) and high fiber foods Colace 100 mg by mouth twice a day  Senokot for constipation as directed and as needed Dulcolax (bisacodyl), take with full glass of water  Miralax (polyethylene glycol) once or twice a day as needed.  If you have tried all these things and are unable to have a bowel movement in the first 3-4 days after surgery call either your surgeon or your primary doctor.  If you experience loose stools or diarrhea, hold the medications until you stool forms back up.  If your symptoms do not get better within 1 week or if they get worse, check with your doctor.  If you experience "the worst abdominal pain ever" or develop nausea or vomiting, please contact the office immediately for further recommendations for treatment.   ITCHING:  If you experience itching with your medications, try taking only a single pain pill, or even half a pain pill at a time.  You can also use Benadryl over the counter for itching or also to help with sleep.   TED HOSE STOCKINGS:  Use stockings on both legs until for at least 2 weeks or as directed by physician office. They may be removed at night for sleeping.  MEDICATIONS:  See your medication summary on the "After Visit Summary" that nursing will review with you.  You may have some home medications which will be placed on hold until you complete the course of blood thinner medication.  It is important for you to complete the blood thinner medication as prescribed.  PRECAUTIONS:  If you experience chest pain or shortness of breath - call 911 immediately for transfer to the hospital emergency department.   If you develop a fever greater that 101 F, purulent drainage from wound, increased redness or drainage from wound, foul odor from the wound/dressing, or calf pain -  CONTACT YOUR SURGEON.                                                   FOLLOW-UP APPOINTMENTS:  If you do not already have a post-op appointment, please call the office for an appointment to be seen by your surgeon.  Guidelines for how soon to be seen are listed in your "After Visit Summary", but are typically between 1-4 weeks after surgery.  OTHER INSTRUCTIONS:   Knee Replacement:  Do not place pillow under knee, focus on keeping the knee straight while resting. CPM instructions: 0-90 degrees, 2 hours in the morning, 2 hours in the afternoon, and 2 hours in the evening. Place foam block, curve side up under heel at all times except when in CPM or when walking.  DO NOT modify, tear, cut, or change the foam block in any way.  MAKE SURE YOU:  Understand these instructions.  Get help right away if you are not doing well or get worse.    Thank you for letting us be a part of your medical care team.  It is a privilege we respect greatly.  We hope these instructions will help you stay on track for a fast and full recovery!   Increase activity slowly as tolerated   Complete by:  As directed       Follow-up Information    Melrose Nakayama, MD. Schedule an appointment as soon as possible for a visit in 2 weeks.   Specialty:  Orthopedic Surgery Contact information: [place] [address] [phone]            Signed: Larwance Sachs Islay Polanco 03/04/2019, 8:24 AM

## 2019-03-04 NOTE — Telephone Encounter (Signed)
Send update request for [facility].  If they are still waiting on response from patient, I will forward DME order to [facility].

## 2019-03-04 NOTE — Progress Notes (Signed)
Subjective: 1 Day Post-Op Procedure(s) (LRB): TOTAL KNEE ARTHROPLASTY (Left)   Patient feels well and is hoping to go home today.  Activity level:  wbat Diet tolerance:  ok Voiding:  Foley out this morning Patient reports pain as mild.    Objective: Vital signs in last 24 hours: Temp:  [96.9 F (36.1 C)-98.9 F (37.2 C)] 98.1 F (36.7 C) (03/18 0433) Pulse Rate:  [50-76] 72 (03/18 0433) Resp:  [10-24] 16 (03/18 0433) BP: (91-115)/(50-76) 106/74 (03/18 0631) SpO2:  [94 %-100 %] 96 % (03/18 0433) Weight:  [78.2 kg] 78.2 kg (03/17 1042)  Labs: No results for input(s): HGB in the last 72 hours. No results for input(s): WBC, RBC, HCT, PLT in the last 72 hours. No results for input(s): NA, K, CL, CO2, BUN, CREATININE, GLUCOSE, CALCIUM in the last 72 hours. No results for input(s): LABPT, INR in the last 72 hours.  Physical Exam:  Neurologically intact ABD soft Neurovascular intact Sensation intact distally Intact pulses distally Dorsiflexion/Plantar flexion intact Incision: dressing C/D/I and scant drainage No cellulitis present Compartment soft  Assessment/Plan:  1 Day Post-Op Procedure(s) (LRB): TOTAL KNEE ARTHROPLASTY (Left) Advance diet Up with therapy D/C IV fluids Discharge home with home health today after PT if doing ell and cleared. Follow up in office 2 weeks post op. Continue on ASA 325mg  BID x 2 weeks post op   Larwance Sachs Harman Ferrin 03/04/2019, 8:19 AM

## 2019-03-04 NOTE — Progress Notes (Signed)
At 1025, the pt was provided with d/c instructions. After discussing the pt's plan of care upon d/c home, the pt reported no further questions or concerns.

## 2019-03-04 NOTE — Progress Notes (Signed)
Physical Therapy Treatment Patient Details Name: Robin Small MRN: [id number] DOB: 1961-01-12 Today's Date: 03/04/2019    History of Present Illness 58 yo female s/p L TKR on 03/03/19. PMH includes dyspnea, OSA, hemmorhoids, anxiety, depression, HLD.     PT Comments    Pt ambulated in hallway, practiced safe step technique and performed LE exercises.  Pt provided with HEP handout.  Pt eager to d/c home today and had no further questions.   Follow Up Recommendations  Follow surgeon's recommendation for DC plan and follow-up therapies;Supervision for mobility/OOB     Equipment Recommendations  None recommended by PT    Recommendations for Other Services       Precautions / Restrictions Precautions Precautions: Fall;Knee Restrictions LLE Weight Bearing: Weight bearing as tolerated    Mobility  Bed Mobility Overal bed mobility: Needs Assistance Bed Mobility: Supine to Sit;Sit to Supine     Supine to sit: Supervision;HOB elevated Sit to supine: Supervision;HOB elevated      Transfers Overall transfer level: Needs assistance Equipment used: Rolling walker (2 wheeled) Transfers: Sit to/from Stand Sit to Stand: Min guard;Supervision         General transfer comment: verbal cues for UE and LE positioning  Ambulation/Gait Ambulation/Gait assistance: Min guard;Supervision Gait Distance (Feet): 160 Feet Assistive device: Rolling walker (2 wheeled) Gait Pattern/deviations: Decreased stance time - left;Antalgic;Step-through pattern     General Gait Details: verbal cues for sequence, step length, RW positioning   Stairs Stairs: Yes Stairs assistance: Min guard Stair Management: Step to pattern;With walker;Forwards Number of Stairs: 1 General stair comments: verbal cues for sequence, RW positioning; performed well   Wheelchair Mobility    Modified Rankin (Stroke Patients Only)       Balance                                             Cognition Arousal/Alertness: Awake/alert Behavior During Therapy: WFL for tasks assessed/performed Overall Cognitive Status: Within Functional Limits for tasks assessed                                        Exercises Total Joint Exercises Ankle Circles/Pumps: AROM;10 reps;Both Quad Sets: AROM;10 reps;Both Short Arc Quad: AROM;10 reps;Both Heel Slides: 10 reps;AROM;Left Hip ABduction/ADduction: AROM;10 reps;Left Straight Leg Raises: AROM;10 reps;Left    General Comments        Pertinent Vitals/Pain Pain Assessment: 0-10 Pain Score: 2  Pain Location: L knee  Pain Descriptors / Indicators: Aching;Sore Pain Intervention(s): Monitored during session;Repositioned;Limited activity within patient's tolerance    Home Living                      Prior Function            PT Goals (current goals can now be found in the care plan section) Progress towards PT goals: Progressing toward goals    Frequency    7X/week      PT Plan Current plan remains appropriate    Co-evaluation              AM-PAC PT "6 Clicks" Mobility   Outcome Measure  Help needed turning from your back to your side while in a flat bed without using bedrails?: A Little Help needed moving from lying  on your back to sitting on the side of a flat bed without using bedrails?: A Little Help needed moving to and from a bed to a chair (including a wheelchair)?: A Little Help needed standing up from a chair using your arms (e.g., wheelchair or bedside chair)?: A Little Help needed to walk in hospital room?: A Little Help needed climbing 3-5 steps with a railing? : A Little 6 Click Score: 18    End of Session Equipment Utilized During Treatment: Gait belt Activity Tolerance: Patient tolerated treatment well Patient left: with call bell/phone within reach;in chair Nurse Communication: Mobility status PT Visit Diagnosis: Other abnormalities of gait and mobility (R26.89)      Time: 0158-6825 PT Time Calculation (min) (ACUTE ONLY): 24 min  Charges:  $Gait Training: 8-22 mins $Therapeutic Exercise: 8-22 mins                     Carmelia Bake, PT, DPT Acute Rehabilitation Services Office: [phone] Pager: [phone]  York Ram E 03/04/2019, 1:32 PM

## 2019-03-05 DIAGNOSIS — M1990 Unspecified osteoarthritis, unspecified site: Secondary | ICD-10-CM | POA: Diagnosis not present

## 2019-03-05 NOTE — Telephone Encounter (Signed)
Called and spoke with Patient.  Patient is currently home from recent knee surgery.  [facility] recommendations given.  Understanding stated. Patient stated she would call for appointment when able. Nothing further at this time.  Sallee Lange sent to Vella Kohler D        She just needs to be scheduled to come in for an appointment. With that appointment if she will bring in her old machine so we can get serial numbers off that machine.    Sending to [facility].

## 2019-03-06 DIAGNOSIS — M1990 Unspecified osteoarthritis, unspecified site: Secondary | ICD-10-CM | POA: Diagnosis not present

## 2019-03-10 DIAGNOSIS — M1990 Unspecified osteoarthritis, unspecified site: Secondary | ICD-10-CM | POA: Diagnosis not present

## 2019-05-25 ENCOUNTER — Ambulatory Visit: Payer: BLUE CROSS/BLUE SHIELD | Admitting: Internal Medicine

## 2019-07-22 DIAGNOSIS — M25561 Pain in right knee: Secondary | ICD-10-CM | POA: Diagnosis not present

## 2019-07-31 ENCOUNTER — Telehealth: Payer: Self-pay | Admitting: Internal Medicine

## 2019-07-31 DIAGNOSIS — G4733 Obstructive sleep apnea (adult) (pediatric): Secondary | ICD-10-CM

## 2019-07-31 MED ORDER — ZOLPIDEM TARTRATE 5 MG PO TABS: ORAL_TABLET | ORAL | 1 refills | Status: DC

## 2019-07-31 NOTE — Telephone Encounter (Signed)
Attempted to contact patient at 3pm, left voicemail.   Also requesting new order for cpap mask.   Requesting refill of Ambien ([facility]:  Last filled: 02/13/19 #90 with 1 refill.  Last OV: 02/13/19  CY please advise on Ambien refill. Order has been pended. Please advise if okay to place order for new cpap mask. Thanks.

## 2019-07-31 NOTE — Telephone Encounter (Signed)
LVM for patient to advise she can let the provider know of the needed letter at the time of the visit. Nothing further needed at this time.

## 2019-07-31 NOTE — Telephone Encounter (Signed)
Patient called in stating she also needed surgical clearance for knee surgery scheduled in September. Patient was given the already scheduled appointment information and advised to contact the orthopedic office and request they fax the clearance form to our office and mail her a copy to bring to the appointment (in case we do not receive the fax).  Patient stated that she uses a full face mask. It is a airfit medium F20.  Order placed for replacement mask. Nothing further needed at this time.

## 2019-07-31 NOTE — Addendum Note (Signed)
Addended by: Nena Polio on: 07/31/2019 03:58 PM   Modules accepted: Orders

## 2019-07-31 NOTE — Telephone Encounter (Signed)
Ambien refill e-ent. Computer limits [place] to 5 mg tabs because of her age. If she needs something more, she can take an otc Tylenol PM along with the [place].  Ok to refill CPAP mask of choice and supplies.

## 2019-07-31 NOTE — Telephone Encounter (Signed)
Pt goes to lunch between 3 and 4 due to work and won't be able to answer until then.  Can leave detailed message.  She was also wondering if the appt she has for surgical clearance is enough time or need to come in sooner.

## 2019-08-03 NOTE — Telephone Encounter (Signed)
Pt has appt with SG 08/10/2019.   Called [facility] and spoke with Estill Bamberg to clarify if it is just the OV that is needed once pt sees Judson Roch and she clarified that it is just pt's OV that they will need. Estill Bamberg said to just have PCCs or someone message them after the visit and they can be able to pull the OV after that.  Routing to Judson Roch as an Pharmacist, hospital.

## 2019-08-10 ENCOUNTER — Encounter: Payer: Self-pay | Admitting: Acute Care

## 2019-08-10 ENCOUNTER — Ambulatory Visit (INDEPENDENT_AMBULATORY_CARE_PROVIDER_SITE_OTHER): Payer: BC Managed Care – PPO | Admitting: Acute Care

## 2019-08-10 ENCOUNTER — Other Ambulatory Visit: Payer: Self-pay

## 2019-08-10 VITALS — BP 100/62 | HR 56 | Temp 97.1°F | Ht 61.0 in | Wt 177.6 lb

## 2019-08-10 DIAGNOSIS — J302 Other seasonal allergic rhinitis: Secondary | ICD-10-CM | POA: Diagnosis not present

## 2019-08-10 DIAGNOSIS — Z720 Tobacco use: Secondary | ICD-10-CM

## 2019-08-10 DIAGNOSIS — G4733 Obstructive sleep apnea (adult) (pediatric): Secondary | ICD-10-CM

## 2019-08-10 DIAGNOSIS — Z0189 Encounter for other specified special examinations: Secondary | ICD-10-CM

## 2019-08-10 NOTE — Assessment & Plan Note (Signed)
States she needs a new machine Wears her machine " most of the time" Machine is > 56 years old and does not have a sim card Plan We will place an order for a new machine , mask and supplies. Please replace mask before surgery 09/15/2019. We will see if we can change your DME ( She does not want to use [facility]) You may need a repeat sleep test, depending on your insurance.We will let you know if we need to schedule one. Once you get your new CPAP machine :   Continue on CPAP at bedtime.  Goal is to wear for at least 4- 6 hours each night for maximal clinical benefit. Continue to work on weight loss, as the link between excess weight  and sleep apnea is well established.   Remember to establish a good bedtime routine, and work on sleep hygiene.  Limit daytime naps , avoid stimulants such as caffeine and nicotine close to bedtime, exercise daily to promote sleep quality, avoid heavy , spicy, fried , or rich foods before bed.  Ensure adequate exposure to natural light during the day,establish a relaxing bedtime routine with a pleasant sleep environment ( Bedroom between 60 and 67 degrees, turn off bright lights , TV or device screens screens , consider black out curtains or white noise machines) Do not drive if sleepy. Remember to clean mask, tubing, filter, and reservoir once weekly with soapy water.  Follow up with [place] in 90 days after you get your new machine, or before as needed.

## 2019-08-10 NOTE — Progress Notes (Signed)
History of Present Illness Robin Small is a 58 y.o. female former smoker with problem list which  Includes OSA,  mild intermittent asthma, anxiety, depression, hyperlipidemia, low back pain. She is followed by Robin Small.   08/10/2019  Pt presents for surgical clearance/ risk assessment  for knee replacement by Robin  Small on 09/15/2019.Marland Kitchen She states she has been using her CPAP machine for OSA " most of the time", but she does not have a sim card so there is no way to assess compliance with therapy or effectiveness of therapy. We discussed the risks of untreated OSA.  She states she needs a new mask. She does not want to use [facility] any more as she feels they have taken too long to get her mask.She states she has been doing well. She does not have any issues with shortness of breath. She does not have a cough, or any increase in secretions. Any secretions she does have are clear.She is compliant with her rescue inhaler as needed, but states she rarely needs it. . She has been smoking. She states she had her last cigarette 2 weeks ago. She has been working from home and has been socially distancing.   She continues to have  difficulties with [facility] related to billing when she sought to buy her original machine.  Has continued to use CPAP regularly and sleeps much better with it.  She states it controls her snoring.  She has been making filters and making repairs with duct tape but mask is no longer working adequately for her. Seasonal allergic rhinitis managed with Claritin or Zyrtec.  She takes these daily.  She uses Ambien 10 mg for sleep every night.   Most recent PFT's were done 02/2013. They were normal at that time, but she has continued to smoke until 2 weeks ago.       1) RISK FOR PROLONGED MECHANICAL VENTILAION - > 48h  1A) Arozullah - Prolonged mech ventilation risk Arozullah Postperative Pulmonary Risk Score - for mech ventilation dependence >48h [name], Ann Surg  2000, major non-cardiac surgery) Comment Score  Type of surgery - abd ao aneurysm (27), thoracic (21), neurosurgery / upper abdominal / vascular (21), neck (11) 0 0  Emergency Surgery - (11) 0 0  ALbumin < 3 or poor nutritional state - (9) Unknown Unknown, pre-op labs not yet done  BUN > 30 -  (8) Unknown   Partial or completely dependent functional status - (7) 0   COPD -  (6) 0   Age - 60 to 69 (4), > 70  (6) 0   TOTAL 0   Risk Stratifcation scores  - < 10 (0.5%), 11-19 (1.8%), 20-27 (4.2%), 28-40 (10.1%), >40 (26.6%)        1B) GUPTA - Prolonged Mech Vent Risk Score source Risk  Guptal post op prolonged mech ventilation > 48h or reintubation < 30 days - ACS 2007-2008 dataset - http://lewis-perez.info/  0.3 % Risk of mechanical ventilation for >48 hrs after surgery, or unplanned intubation ?30 days of surgery    2) RISK FOR POST OP PNEUMONIA  Score source Risk  Lyndel Safe - Post Op Pnemounia risk  [email or web address] 0.4 % Risk of postoperative pneumonia    R3) ISK FOR ANY POST-OP PULMONARY COMPLICATION Score source Risk  CANET/ARISCAT Score - risk for ANY/ALl pulmonary complications - > risk of in-hospital post-op pulmonary complications (composite including respiratory failure, respiratory infection, pleural effusion, atelectasis, pneumothorax, bronchospasm, aspiration pneumonitis) [email or web address] - based on age,  anemia, pulse ox, resp infection prior 30d, incision site, duration of surgery, and emergency v elective surgery 19 points ARISCAT Score Low risk 1.6% risk of in-hospital post-op pulmonary complications (composite including respiratory failure, respiratory infection, pleural effusion, atelectasis, pneumothorax, bronchospasm, aspiration pneumonitis)    Major Pulmonary risks identified in the multifactorial risk analysis are but not  limited to a) pneumonia; b) recurrent intubation risk; c) prolonged or recurrent acute respiratory failure needing mechanical ventilation; d) prolonged hospitalization; e) DVT/Pulmonary embolism; f) Acute Pulmonary edema  Recommend 1. Short duration of surgery as much as possible and avoid paralytic if possible 2. Recovery in step down or ICU with Pulmonary consultation>> Please order prn albuterol nebs. 3. DVT prophylaxis 4. Pt must wear CPAP machine each night in the hospital. Settings are Auto CPAP 5-20 cm H2O 5. Aggressive pulmonary toilet with 02, bronchodilatation, and incentive spirometry and early ambulation 6. Claritin 10 mg daily for allergies ( as she takes this daily) 7. Minimize sedating drugs post op.   Test Results: NPSG 02/19/13- AHI 57.8/ hr, desaturation to 63%, body weight 195 lbs, CPAP titrated to 10. PFT 03/12/13 >> FEV1 2.77 (119%), FEV1% 77, TLC 5.35 (117%), DLCO 86%, no BD  CBC Latest Ref Rng & Units 11/02/2017 08/24/2013 02/08/2012  WBC 4.0 - 10.5 K/uL 9.0 6.9 8.5  Hemoglobin 12.0 - 15.0 g/dL 12.7 13.5 14.2  Hematocrit 36.0 - 46.0 % 38.6 40.2 42.4  Platelets 150 - 400 K/uL 259 231.0 253.0    BMP Latest Ref Rng & Units 11/02/2017 08/24/2013 04/20/2013  Glucose 65 - 99 mg/dL 107(H) 99 90  BUN 6 - 20 mg/dL 14 13 12   Creatinine 0.44 - 1.00 mg/dL 0.84 0.6 0.6  Sodium 135 - 145 mmol/L 138 139 139  Potassium 3.5 - 5.1 mmol/L 4.2 3.9 4.0  Chloride 101 - 111 mmol/L 108 106 106  CO2 22 - 32 mmol/L 22 27 27   Calcium 8.9 - 10.3 mg/dL 9.1 9.2 9.4    BNP No results found for: BNP  ProBNP No results found for: PROBNP  PFT No results found for: FEV1PRE, FEV1POST, FVCPRE, FVCPOST, TLC, DLCOUNC, PREFEV1FVCRT, PSTFEV1FVCRT  No results found.   Past medical hx Past Medical History:  Diagnosis Date  . Allergy    seasonal allergies  . Arthritis   . Depression   . Diverticulosis 03/2016   Mild, noted on colonoscopy  . High cholesterol   . History of kidney stones   .  Hypertension   . Lichen sclerosus    Anus  . Low blood pressure reading   . OSA on CPAP   . Personal history of colonic polyps 04/06/2011  . PPD positive    Age 61 >> reports taking medicine for one year     Social History   Tobacco Use  . Smoking status: Former Smoker    Packs/day: 0.50    Years: 42.00    Pack years: 21.00    Types: Cigarettes    Quit date: 07/31/2019    Years since quitting: 0.0  . Smokeless tobacco: Never Used  Substance Use Topics  . Alcohol use: Yes    Alcohol/week: 0.0 standard drinks    Comment: rare  . Drug use: No    Ms.Boyar reports that she quit smoking 10 days ago. Her smoking use included cigarettes. She has a 21.00 pack-year smoking history. She has never used smokeless tobacco. She reports current alcohol use. She reports that she does not use drugs.  Tobacco Cessation: Current every day  smoker. She states she quit smoking 2 weeks ago. She understands and verbalized the risks of continued tobacco abuse.  Past surgical hx, Family hx, Social hx all reviewed.  Current Outpatient Medications on File Prior to Visit  Medication Sig  . aspirin EC 325 MG EC tablet Take 1 tablet (325 mg total) by mouth 2 (two) times daily after a meal.  . Calcium Carbonate-Vitamin D (CALCIUM-D) 600-400 MG-UNIT TABS Take 2 tablets by mouth at bedtime.   . clobetasol cream (TEMOVATE) [id number] % Apply 1 application topically 2 (two) times daily as needed (skin irritation).  Marland Kitchen co-enzyme Q-10 50 MG capsule Take 50 mg by mouth daily.  Marland Kitchen HYDROcodone-acetaminophen (NORCO/VICODIN) 5-325 MG tablet Take 1-2 tablets by mouth every 6 (six) hours as needed for moderate pain (pain score 4-6).  Marland Kitchen loratadine (CLARITIN) 10 MG tablet Take 10 mg by mouth daily as needed for allergies.   . Multiple Vitamins-Minerals (AIRBORNE PO) Take 2 tablets by mouth daily.  Marland Kitchen PROAIR HFA 108 (90 Base) MCG/ACT inhaler INHALE 2 PUFFS INTO THE LUNGS EVERY 6 HOURS AS NEEDED FOR WHEEZING OR SHORTNESS OF BREATH  (Patient taking differently: Inhale 2 puffs into the lungs every 6 (six) hours as needed for wheezing or shortness of breath. )  . tiZANidine (ZANAFLEX) 4 MG tablet Take 1 tablet (4 mg total) by mouth every 6 (six) hours as needed for muscle spasms.  Marland Kitchen zolpidem (AMBIEN) 5 MG tablet 1 at bedtime as needed for sleep   No current facility-administered medications on file prior to visit.      Allergies  Allergen Reactions  . Codeine Nausea Only    Review Of Systems:  Constitutional:   No  weight loss, night sweats,  Fevers, chills, fatigue, or  lassitude.  HEENT:   No headaches,  Difficulty swallowing,  Tooth/dental problems, or  Sore throat,                No sneezing, itching, ear ache, nasal congestion, post nasal drip,   CV:  No chest pain,  Orthopnea, PND, swelling in lower extremities, anasarca, dizziness, palpitations, syncope.   GI  No heartburn, indigestion, abdominal pain, nausea, vomiting, diarrhea, change in bowel habits, loss of appetite, bloody stools.   Resp: No shortness of breath with exertion or at rest.  No excess mucus, no productive cough,  No non-productive cough,  No coughing up of blood.  No change in color of mucus.  No wheezing.  No chest wall deformity  Skin: no rash or lesions.  GU: no dysuria, change in color of urine, no urgency or frequency.  No flank pain, no hematuria   MS:  No joint pain or swelling.  No decreased range of motion.  No back pain.  Psych:  No change in mood or affect. No depression or anxiety.  No memory loss.   Vital Signs BP 100/62 (BP Location: Right Arm, Cuff Size: Large)   Pulse (!) 56   Temp (!) 97.1 F (36.2 C) (Oral)   Ht 5\' 1"  (1.549 m)   Wt 177 lb 9.6 oz (80.6 kg)   SpO2 98%   BMI 33.56 kg/m    Physical Exam:  General- No distress,  A&Ox3, pleasant ENT: No sinus tenderness, TM clear, pale nasal mucosa, no oral exudate,no post nasal drip, no LAN Cardiac: S1, S2, regular rate and rhythm, no murmur Chest: No  wheeze/ rales/ dullness; no accessory muscle use, no nasal flaring, no sternal retractions Abd.: Soft Non-tender, ND, BS +, Body  mass index is 33.56 kg/m. Ext: No clubbing cyanosis, edema Neuro:  normal strength, MAE x 4, A&O x 3 Skin: No rashes,no skin lesions,  warm and dry, intact Psych: normal mood and behavior   Assessment/Plan  OSA (obstructive sleep apnea) States she needs a new machine Wears her machine " most of the time" Machine is > 56 years old and does not have a sim card Plan We will place an order for a new machine , mask and supplies. Please replace mask before surgery 09/15/2019. We will see if we can change your DME ( She does not want to use [facility]) You may need a repeat sleep test, depending on your insurance.We will let you know if we need to schedule one. Once you get your new CPAP machine :   Continue on CPAP at bedtime.  Goal is to wear for at least 4- 6 hours each night for maximal clinical benefit. Continue to work on weight loss, as the link between excess weight  and sleep apnea is well established.   Remember to establish a good bedtime routine, and work on sleep hygiene.  Limit daytime naps , avoid stimulants such as caffeine and nicotine close to bedtime, exercise daily to promote sleep quality, avoid heavy , spicy, fried , or rich foods before bed.  Ensure adequate exposure to natural light during the day,establish a relaxing bedtime routine with a pleasant sleep environment ( Bedroom between 60 and 67 degrees, turn off bright lights , TV or device screens screens , consider black out curtains or white noise machines) Do not drive if sleepy. Remember to clean mask, tubing, filter, and reservoir once weekly with soapy water.  Follow up with [place] in 90 days after you get your new machine, or before as needed.     Surgical Clearance R Knee replacement Scheduled for 09/15/2019 with Dr. Melrose Nakayama Quit smoking 2 weeks ago See above documented specific  risk factor scores Plan We will clear you for surgery with low to moderate risk of pulmonary  Complications with  specific guidelines to reduce risk of pulmonary/ respiratory complications. Recommend 1. Short duration of surgery as much as possible and avoid paralytic if possible 2. Recovery in step down or ICU with Pulmonary consultation>> Please order prn albuterol nebs/ rescue inhaler . 3. DVT prophylaxis 4. Pt must wear CPAP machine each night in the hospital. Settings are Auto CPAP 5-20 cm H2O 5. Aggressive pulmonary toilet with 02, bronchodilatation, and incentive spirometry and early ambulation 6. Claritin 10 mg daily for allergies ( as she takes this daily) 7. Minimize sedating drugs post op.  This appointment was over 30  minutes long with over 50% of the time being direct face to face patient care, assessment , plan of care , and follow up.    Magdalen Spatz, NP 08/10/2019  10:08 AM

## 2019-08-10 NOTE — Patient Instructions (Addendum)
It is good to meet you today. We will place an order for a new machine , mask and supplies. Please replace mask before surgery 09/15/2019. We will see if we can change your DME ( She does not want to use [facility]) You may need a repeat sleep test, depending on your insurance.We will let you know if we need to schedule one. Once you get your new CPAP machine :   Continue on CPAP at bedtime.  Goal is to wear for at least 4- 6 hours each night for maximal clinical benefit. Continue to work on weight loss, as the link between excess weight  and sleep apnea is well established.   Remember to establish a good bedtime routine, and work on sleep hygiene.  Limit daytime naps , avoid stimulants such as caffeine and nicotine close to bedtime, exercise daily to promote sleep quality, avoid heavy , spicy, fried , or rich foods before bed.  Ensure adequate exposure to natural light during the day,establish a relaxing bedtime routine with a pleasant sleep environment ( Bedroom between 60 and 67 degrees, turn off bright lights , TV or device screens screens , consider black out curtains or white noise machines) Do not drive if sleepy. Remember to clean mask, tubing, filter, and reservoir once weekly with soapy water.  Follow up with [place] in 90 days after you get your new machine, or before as needed.    We will clear you for surgery with specific guidelines to reduce risk of pulmonary/ respiratory complications. Recommend 1. Short duration of surgery as much as possible and avoid paralytic if possible 2. Recovery in step down or ICU with Pulmonary consultation>> Please order prn albuterol nebs/ rescue inhaler . 3. DVT prophylaxis 4. Pt must wear CPAP machine each night in the hospital. Settings are Auto CPAP 5-20 cm H2O 5. Aggressive pulmonary toilet with 02, bronchodilatation, and incentive spirometry and early ambulation 6. Claritin 10 mg daily for allergies ( as she takes this daily) 7. Minimize sedating  drugs post op.  Follow up with Dr. Annamaria Boots within 60 days of getting new machine and supplies. Please contact office for sooner follow up if symptoms do not improve or worsen or seek emergency care

## 2019-08-11 ENCOUNTER — Telehealth: Payer: Self-pay | Admitting: Acute Care

## 2019-08-11 NOTE — Telephone Encounter (Signed)
ATC pt, line went to voicemail. LMTCB x1. 

## 2019-08-12 NOTE — Telephone Encounter (Signed)
Attempted to call patient, no answer, left message to call back.  

## 2019-08-12 NOTE — Telephone Encounter (Signed)
Spoke with patient. She stated that she would like to switch DMEs for her CPAP. She has not had a good relationship with them and does not want to get a new machine from them. She wants to cancel the order to [facility] and see if we can get her established with a new DME.   Advised her that I would call [facility] to see if I could cancel the order. She verbalized understanding.   Called [facility] and spoke with Olivia Mackie. She stated that she just spoke with the patient and explained that they are in the process of getting her mask ready. She was not aware that there an order for a replacement CPAP machine as well. Olivia Mackie stated that she will call and talk to the patient again before cancelling the order.   Nothing further needed.

## 2019-08-12 NOTE — Telephone Encounter (Signed)
Patient is returning phone call.  Patient available until 11:30 am.  May leave detailed message on vm.  Patient phone number is [phone].

## 2019-08-12 NOTE — Telephone Encounter (Signed)
Left message for patient to call back.   Per her chart, it appears she saw Judson Roch on 8/24 and a new CPAP machine was ordered. The order has been sent to [place] and [facility] is currently processing the order.

## 2019-08-13 ENCOUNTER — Other Ambulatory Visit: Payer: Self-pay | Admitting: Orthopaedic Surgery

## 2019-08-14 DIAGNOSIS — Z1159 Encounter for screening for other viral diseases: Secondary | ICD-10-CM | POA: Diagnosis not present

## 2019-08-14 DIAGNOSIS — Z0000 Encounter for general adult medical examination without abnormal findings: Secondary | ICD-10-CM | POA: Diagnosis not present

## 2019-08-14 DIAGNOSIS — Z131 Encounter for screening for diabetes mellitus: Secondary | ICD-10-CM | POA: Diagnosis not present

## 2019-08-14 DIAGNOSIS — R0602 Shortness of breath: Secondary | ICD-10-CM | POA: Diagnosis not present

## 2019-08-14 DIAGNOSIS — Z01818 Encounter for other preprocedural examination: Secondary | ICD-10-CM | POA: Diagnosis not present

## 2019-08-14 DIAGNOSIS — R5383 Other fatigue: Secondary | ICD-10-CM | POA: Diagnosis not present

## 2019-08-14 DIAGNOSIS — E559 Vitamin D deficiency, unspecified: Secondary | ICD-10-CM | POA: Diagnosis not present

## 2019-09-09 ENCOUNTER — Encounter (HOSPITAL_COMMUNITY): Payer: Self-pay

## 2019-09-09 ENCOUNTER — Telehealth: Payer: Self-pay | Admitting: Internal Medicine

## 2019-09-09 NOTE — Patient Instructions (Addendum)
DUE TO COVID-19 ONLY ONE VISITOR IS ALLOWED TO COME WITH YOU AND STAY IN THE WAITING ROOM ONLY DURING PRE OP AND PROCEDURE. THE ONE VISITOR MAY VISIT WITH YOU IN YOUR PRIVATE ROOM DURING VISITING HOURS ONLY!!   COVID SWAB TESTING MUST BE COMPLETED ON: Today immediately after pre op appointment.    [address] [place] [facility] enter pre surgical testing line (Must self quarantine after testing. Follow instructions on handout.)             Your procedure is scheduled on: Tuesday, Sept. 29, 2020   Report to [facility] Main  Entrance    Report to admitting at 10:30 AM   Call this number if you have problems the morning of surgery [phone]   Bring CPAP mask and tubing day of surgery   Do not eat food :After Midnight.   May have liquids until 10:00 AM day of surgery   CLEAR LIQUID DIET  Foods Allowed                                                                     Foods Excluded  Water, Black Coffee and tea, regular and decaf                             liquids that you cannot  Plain Jell-O in any flavor  (No red)                                           see through such as: Fruit ices (not with fruit pulp)                                     milk, soups, orange juice  Iced Popsicles (No red)                                    All solid food Carbonated beverages, regular and diet                                    Apple juices Sports drinks like Gatorade (No red) Lightly seasoned clear broth or consume(fat free) Sugar, honey syrup  Sample Menu Breakfast                                Lunch                                     Supper Cranberry juice                    Beef broth  Chicken broth Jell-O                                     Grape juice                           Apple juice Coffee or tea                        Jell-O                                      Popsicle          Coffee or tea                        Coffee or tea   Complete one Ensure drink the morning of surgery at 10:00AM the day of surgery.   Brush your teeth the morning of surgery.   Do NOT smoke after Midnight   Take these medicines the morning of surgery with A SIP OF WATER: None   Bring Asthma Inhaler day of surgery                               You may not have any metal on your body including hair pins, jewelry, and body piercings             Do not wear make-up, lotions, powders, perfumes/cologne, or deodorant             Do not wear nail polish.  Do not shave  48 hours prior to surgery.             Do not bring valuables to the hospital. [place].   Contacts, dentures or bridgework may not be worn into surgery.   Bring small overnight bag day of surgery.    Special Instructions: Bring a copy of your healthcare power of attorney and living will documents         the day of surgery if you haven't scanned them in before.              Please read over the following fact sheets you were given:  [facility] - Preparing for Surgery Before surgery, you can play an important role.  Because skin is not sterile, your skin needs to be as free of germs as possible.  You can reduce the number of germs on your skin by washing with CHG (chlorahexidine gluconate) soap before surgery.  CHG is an antiseptic cleaner which kills germs and bonds with the skin to continue killing germs even after washing. Please DO NOT use if you have an allergy to CHG or antibacterial soaps.  If your skin becomes reddened/irritated stop using the CHG and inform your nurse when you arrive at Short Stay. Do not shave (including legs and underarms) for at least 48 hours prior to the first CHG shower.  You may shave your face/neck.  Please follow these instructions carefully:  1.  Shower with CHG Soap the night before surgery and the  morning of surgery.  2.  If  you  choose to wash your hair, wash your hair first as usual with your normal  shampoo.  3.  After you shampoo, rinse your hair and body thoroughly to remove the shampoo.                             4.  Use CHG as you would any other liquid soap.  You can apply chg directly to the skin and wash.  Gently with a scrungie or clean washcloth.  5.  Apply the CHG Soap to your body ONLY FROM THE NECK DOWN.   Do   not use on face/ open                           Wound or open sores. Avoid contact with eyes, ears mouth and   genitals (private parts).                       Wash face,  Genitals (private parts) with your normal soap.             6.  Wash thoroughly, paying special attention to the area where your    surgery  will be performed.  7.  Thoroughly rinse your body with warm water from the neck down.  8.  DO NOT shower/wash with your normal soap after using and rinsing off the CHG Soap.                9.  Pat yourself dry with a clean towel.            10.  Wear clean pajamas.            11.  Place clean sheets on your bed the night of your first shower and do not  sleep with pets. Day of Surgery : Do not apply any lotions/deodorants the morning of surgery.  Please wear clean clothes to the hospital/surgery center.  FAILURE TO FOLLOW THESE INSTRUCTIONS MAY RESULT IN THE CANCELLATION OF YOUR SURGERY  PATIENT SIGNATURE_________________________________  NURSE SIGNATURE__________________________________  ________________________________________________________________________   Robin Small  An incentive spirometer is a tool that can help keep your lungs clear and active. This tool measures how well you are filling your lungs with each breath. Taking long deep breaths may help reverse or decrease the chance of developing breathing (pulmonary) problems (especially infection) following:  A long period of time when you are unable to move or be active. BEFORE THE PROCEDURE   If the spirometer  includes an indicator to show your best effort, your nurse or respiratory therapist will set it to a desired goal.  If possible, sit up straight or lean slightly forward. Try not to slouch.  Hold the incentive spirometer in an upright position. INSTRUCTIONS FOR USE  1. Sit on the edge of your bed if possible, or sit up as far as you can in bed or on a chair. 2. Hold the incentive spirometer in an upright position. 3. Breathe out normally. 4. Place the mouthpiece in your mouth and seal your lips tightly around it. 5. Breathe in slowly and as deeply as possible, raising the piston or the ball toward the top of the column. 6. Hold your breath for 3-5 seconds or for as long as possible. Allow the piston or ball to fall to the bottom of the column. 7. Remove the mouthpiece from your mouth and  breathe out normally. 8. Rest for a few seconds and repeat Steps 1 through 7 at least 10 times every 1-2 hours when you are awake. Take your time and take a few normal breaths between deep breaths. 9. The spirometer may include an indicator to show your best effort. Use the indicator as a goal to work toward during each repetition. 10. After each set of 10 deep breaths, practice coughing to be sure your lungs are clear. If you have an incision (the cut made at the time of surgery), support your incision when coughing by placing a pillow or rolled up towels firmly against it. Once you are able to get out of bed, walk around indoors and cough well. You may stop using the incentive spirometer when instructed by your caregiver.  RISKS AND COMPLICATIONS  Take your time so you do not get dizzy or light-headed.  If you are in pain, you may need to take or ask for pain medication before doing incentive spirometry. It is harder to take a deep breath if you are having pain. AFTER USE  Rest and breathe slowly and easily.  It can be helpful to keep track of a log of your progress. Your caregiver can provide you with a  simple table to help with this. If you are using the spirometer at home, follow these instructions: [place] IF:   You are having difficultly using the spirometer.  You have trouble using the spirometer as often as instructed.  Your pain medication is not giving enough relief while using the spirometer.  You develop fever of 100.5 F (38.1 C) or higher. SEEK IMMEDIATE MEDICAL CARE IF:   You cough up bloody sputum that had not been present before.  You develop fever of 102 F (38.9 C) or greater.  You develop worsening pain at or near the incision site. MAKE SURE YOU:   Understand these instructions.  Will watch your condition.  Will get help right away if you are not doing well or get worse. Document Released: 04/15/2007 Document Revised: 02/25/2012 Document Reviewed: 06/16/2007 ExitCare Patient Information 2014 ExitCare, [place].   ________________________________________________________________________  WHAT IS A BLOOD TRANSFUSION? Blood Transfusion Information  A transfusion is the replacement of blood or some of its parts. Blood is made up of multiple cells which provide different functions.  Red blood cells carry oxygen and are used for blood loss replacement.  White blood cells fight against infection.  Platelets control bleeding.  Plasma helps clot blood.  Other blood products are available for specialized needs, such as hemophilia or other clotting disorders. BEFORE THE TRANSFUSION  Who gives blood for transfusions?   Healthy volunteers who are fully evaluated to make sure their blood is safe. This is blood bank blood. Transfusion therapy is the safest it has ever been in the practice of medicine. Before blood is taken from a donor, a complete history is taken to make sure that person has no history of diseases nor engages in risky social behavior (examples are intravenous drug use or sexual activity with multiple partners). The donor's travel history  is screened to minimize risk of transmitting infections, such as malaria. The donated blood is tested for signs of infectious diseases, such as HIV and hepatitis. The blood is then tested to be sure it is compatible with you in order to minimize the chance of a transfusion reaction. If you or a relative donates blood, this is often done in anticipation of surgery and is not appropriate for  emergency situations. It takes many days to process the donated blood. RISKS AND COMPLICATIONS Although transfusion therapy is very safe and saves many lives, the main dangers of transfusion include:   Getting an infectious disease.  Developing a transfusion reaction. This is an allergic reaction to something in the blood you were given. Every precaution is taken to prevent this. The decision to have a blood transfusion has been considered carefully by your caregiver before blood is given. Blood is not given unless the benefits outweigh the risks. AFTER THE TRANSFUSION  Right after receiving a blood transfusion, you will usually feel much better and more energetic. This is especially true if your red blood cells have gotten low (anemic). The transfusion raises the level of the red blood cells which carry oxygen, and this usually causes an energy increase.  The nurse administering the transfusion will monitor you carefully for complications. HOME CARE INSTRUCTIONS  No special instructions are needed after a transfusion. You may find your energy is better. Speak with your caregiver about any limitations on activity for underlying diseases you may have. SEEK MEDICAL CARE IF:   Your condition is not improving after your transfusion.  You develop redness or irritation at the intravenous (IV) site. SEEK IMMEDIATE MEDICAL CARE IF:  Any of the following symptoms occur over the next 12 hours:  Shaking chills.  You have a temperature by mouth above 102 F (38.9 C), not controlled by medicine.  Chest, back, or  muscle pain.  People around you feel you are not acting correctly or are confused.  Shortness of breath or difficulty breathing.  Dizziness and fainting.  You get a rash or develop hives.  You have a decrease in urine output.  Your urine turns a dark color or changes to pink, red, or brown. Any of the following symptoms occur over the next 10 days:  You have a temperature by mouth above 102 F (38.9 C), not controlled by medicine.  Shortness of breath.  Weakness after normal activity.  The white part of the eye turns yellow (jaundice).  You have a decrease in the amount of urine or are urinating less often.  Your urine turns a dark color or changes to pink, red, or brown. Document Released: 11/30/2000 Document Revised: 02/25/2012 Document Reviewed: 07/19/2008 [facility] Patient Information 2014 [place], [place].  _______________________________________________________________________

## 2019-09-09 NOTE — Telephone Encounter (Signed)
It is after 5:30 PM EDT and the office is now closed. Will route this message to myself for f/u tomorrow.

## 2019-09-10 NOTE — Telephone Encounter (Signed)
ATC pt, line went to voicemail, LMTCB x1.  

## 2019-09-11 ENCOUNTER — Ambulatory Visit (HOSPITAL_COMMUNITY)
Admission: RE | Admit: 2019-09-11 | Discharge: 2019-09-11 | Disposition: A | Discharge status: Home / Self Care | Payer: BC Managed Care – PPO | Source: Ambulatory Visit | Attending: Orthopaedic Surgery | Admitting: Orthopaedic Surgery

## 2019-09-11 ENCOUNTER — Other Ambulatory Visit (HOSPITAL_COMMUNITY)
Admission: RE | Admit: 2019-09-11 | Discharge: 2019-09-11 | Disposition: A | Discharge status: Home / Self Care | Payer: BC Managed Care – PPO | Source: Ambulatory Visit | Attending: Orthopaedic Surgery | Admitting: Orthopaedic Surgery

## 2019-09-11 ENCOUNTER — Encounter (HOSPITAL_COMMUNITY): Payer: Self-pay

## 2019-09-11 ENCOUNTER — Other Ambulatory Visit: Payer: Self-pay

## 2019-09-11 ENCOUNTER — Encounter (HOSPITAL_COMMUNITY)
Admission: RE | Admit: 2019-09-11 | Discharge: 2019-09-11 | Disposition: A | Discharge status: Home / Self Care | Payer: BC Managed Care – PPO | Source: Ambulatory Visit | Attending: Orthopaedic Surgery | Admitting: Orthopaedic Surgery

## 2019-09-11 DIAGNOSIS — G4733 Obstructive sleep apnea (adult) (pediatric): Secondary | ICD-10-CM | POA: Insufficient documentation

## 2019-09-11 DIAGNOSIS — Z01818 Encounter for other preprocedural examination: Secondary | ICD-10-CM | POA: Insufficient documentation

## 2019-09-11 DIAGNOSIS — E785 Hyperlipidemia, unspecified: Secondary | ICD-10-CM | POA: Insufficient documentation

## 2019-09-11 DIAGNOSIS — Z20828 Contact with and (suspected) exposure to other viral communicable diseases: Secondary | ICD-10-CM | POA: Insufficient documentation

## 2019-09-11 DIAGNOSIS — Z7982 Long term (current) use of aspirin: Secondary | ICD-10-CM | POA: Diagnosis not present

## 2019-09-11 DIAGNOSIS — F17210 Nicotine dependence, cigarettes, uncomplicated: Secondary | ICD-10-CM | POA: Diagnosis not present

## 2019-09-11 DIAGNOSIS — Z79899 Other long term (current) drug therapy: Secondary | ICD-10-CM | POA: Insufficient documentation

## 2019-09-11 DIAGNOSIS — M1711 Unilateral primary osteoarthritis, right knee: Secondary | ICD-10-CM | POA: Insufficient documentation

## 2019-09-11 HISTORY — DX: Other specified diseases of anus and rectum: K62.89

## 2019-09-11 HISTORY — DX: Anxiety disorder, unspecified: F41.9

## 2019-09-11 HISTORY — DX: Other specified postprocedural states: R11.2

## 2019-09-11 HISTORY — DX: Other seasonal allergic rhinitis: J30.2

## 2019-09-11 HISTORY — DX: Unspecified osteoarthritis, unspecified site: M19.90

## 2019-09-11 HISTORY — DX: Prediabetes: R73.03

## 2019-09-11 HISTORY — DX: Other specified postprocedural states: Z98.890

## 2019-09-11 LAB — URINALYSIS, ROUTINE W REFLEX MICROSCOPIC
Bilirubin Urine: NEGATIVE
Glucose, UA: NEGATIVE mg/dL
Hgb urine dipstick: NEGATIVE
Ketones, ur: NEGATIVE mg/dL
Leukocytes,Ua: NEGATIVE
Nitrite: NEGATIVE
Protein, ur: NEGATIVE mg/dL
Specific Gravity, Urine: 1.011 (ref 1.005–1.030)
pH: 6 (ref 5.0–8.0)

## 2019-09-11 LAB — CBC WITH DIFFERENTIAL/PLATELET
Abs Immature Granulocytes: 0.04 10*3/uL (ref 0.00–0.07)
Basophils Absolute: 0.1 10*3/uL (ref 0.0–0.1)
Basophils Relative: 1 %
Eosinophils Absolute: 0.3 10*3/uL (ref 0.0–0.5)
Eosinophils Relative: 3 %
HCT: 43.1 % (ref 36.0–46.0)
Hemoglobin: 13.6 g/dL (ref 12.0–15.0)
Immature Granulocytes: 1 %
Lymphocytes Relative: 30 %
Lymphs Abs: 2.6 10*3/uL (ref 0.7–4.0)
MCH: 28.7 pg (ref 26.0–34.0)
MCHC: 31.6 g/dL (ref 30.0–36.0)
MCV: 90.9 fL (ref 80.0–100.0)
Monocytes Absolute: 0.7 10*3/uL (ref 0.1–1.0)
Monocytes Relative: 8 %
Neutro Abs: 4.9 10*3/uL (ref 1.7–7.7)
Neutrophils Relative %: 57 %
Platelets: 302 10*3/uL (ref 150–400)
RBC: 4.74 MIL/uL (ref 3.87–5.11)
RDW: 13.3 % (ref 11.5–15.5)
WBC: 8.5 10*3/uL (ref 4.0–10.5)
nRBC: 0 % (ref 0.0–0.2)

## 2019-09-11 LAB — APTT: aPTT: 30 seconds (ref 24–36)

## 2019-09-11 LAB — SURGICAL PCR SCREEN
MRSA, PCR: NEGATIVE
Staphylococcus aureus: NEGATIVE

## 2019-09-11 LAB — HEMOGLOBIN A1C
Hgb A1c MFr Bld: 6.2 % — ABNORMAL HIGH (ref 4.8–5.6)
Mean Plasma Glucose: 131.24 mg/dL

## 2019-09-11 LAB — BASIC METABOLIC PANEL
Anion gap: 10 (ref 5–15)
BUN: 14 mg/dL (ref 6–20)
CO2: 27 mmol/L (ref 22–32)
Calcium: 9.8 mg/dL (ref 8.9–10.3)
Chloride: 103 mmol/L (ref 98–111)
Creatinine, Ser: 0.57 mg/dL (ref 0.44–1.00)
GFR calc Af Amer: >60 mL/min (ref >60)
GFR calc non Af Amer: >60 mL/min (ref >60)
Glucose, Bld: 110 mg/dL — ABNORMAL HIGH (ref 70–99)
Potassium: 4.2 mmol/L (ref 3.5–5.1)
Sodium: 140 mmol/L (ref 135–145)

## 2019-09-11 LAB — PROTIME-INR
INR: 1 (ref 0.8–1.2)
Prothrombin Time: 13.1 seconds (ref 11.4–15.2)

## 2019-09-11 NOTE — Telephone Encounter (Signed)
ATC pt, line went to voicemail, LMTCB x2.

## 2019-09-11 NOTE — Progress Notes (Signed)
SPOKE W/  Lattie Haw     SCREENING SYMPTOMS OF COVID 19:   COUGH--NO  RUNNY NOSE--- NO  SORE THROAT---NO  NASAL CONGESTION----NO  SNEEZING----NO  SHORTNESS OF BREATH---NO  DIFFICULTY BREATHING---NO  TEMP >100.0 -----NO  UNEXPLAINED BODY ACHES------NO  CHILLS -------- NO  HEADACHES ---------NO  LOSS OF SMELL/ TASTE --------NO    HAVE YOU OR ANY FAMILY MEMBER TRAVELLED PAST 14 DAYS OUT OF THE   COUNTY---NO STATE----NO COUNTRY----NO  HAVE YOU OR ANY FAMILY MEMBER BEEN EXPOSED TO ANYONE WITH COVID 19? NO

## 2019-09-11 NOTE — Progress Notes (Addendum)
PCP - Dr. Royce Macadamia, [facility] Cardiologist - N/A  Chest x-ray -  N/A EKG - Requested from [place] ECHO -  N/A Cardiac Cath -  N/A  Sleep Study - 03/03/2013 in epic CPAP - Yes  Fasting Blood Sugar - N/A  Checks Blood Sugar _ N/A____ times a day  Blood Thinner Instructions: N/A Aspirin Instructions: N/A Last Dose: N/A  Anesthesia review:  N/A  Patient denies shortness of breath, fever, cough and chest pain at PAT appointment   Patient verbalized understanding of instructions that were given to them at the PAT appointment. Patient was also instructed that they will need to review over the PAT instructions again at home before surgery.

## 2019-09-12 LAB — NOVEL CORONAVIRUS, NAA (HOSP ORDER, SEND-OUT TO REF LAB; TAT 18-24 HRS): SARS-CoV-2, NAA: NOT DETECTED

## 2019-09-14 MED ORDER — TRANEXAMIC ACID 1000 MG/10ML IV SOLN
2000.0000 mg | INTRAVENOUS | Status: DC
  Filled 2019-09-14: qty 20

## 2019-09-14 MED ORDER — BUPIVACAINE LIPOSOME 1.3 % IJ SUSP
20.0000 mL | Freq: Once | INTRAMUSCULAR | Status: DC
  Filled 2019-09-14: qty 20

## 2019-09-14 NOTE — Telephone Encounter (Signed)
LMTCB

## 2019-09-14 NOTE — Telephone Encounter (Signed)
Multiple attempts have been made to reach this patient. Per triage protocol, will close out this message. Nothing further needed at this time.

## 2019-09-14 NOTE — H&P (Signed)
TOTAL KNEE ADMISSION H&P  Patient is being admitted for right total knee arthroplasty.  Subjective:  Chief Complaint:right knee pain.  HPI: NEKEIA SHELER, 58 y.o. female, has a history of pain and functional disability in the right knee due to arthritis and has failed non-surgical conservative treatments for greater than 12 weeks to includeNSAID's and/or analgesics, flexibility and strengthening excercises, supervised PT with diminished ADL's post treatment, use of assistive devices, weight reduction as appropriate and activity modification.  Onset of symptoms was gradual, starting 5 years ago with gradually worsening course since that time. The patient noted no past surgery on the right knee(s).  Patient currently rates pain in the right knee(s) at 10 out of 10 with activity. Patient has night pain, worsening of pain with activity and weight bearing, pain that interferes with activities of daily living, crepitus and joint swelling.  Patient has evidence of subchondral cysts, subchondral sclerosis, periarticular osteophytes and joint space narrowing by imaging studies. There is no active infection.  Patient Active Problem List   Diagnosis Date Noted  . Primary osteoarthritis of left knee 03/03/2019  . Seasonal and perennial allergic rhinitis 02/13/2019  . Sinusitis, acute 08/24/2013  . Vesicular rash 08/24/2013  . Dyspnea 01/08/2013  . OSA (obstructive sleep apnea) 01/08/2013  . Muscle ache 02/08/2012  . Personal history of colonic polyps 04/06/2011  . HEMATOCHEZIA 02/26/2011  . HEMORRHOIDS, INTERNAL 12/27/2010  . EXTERNAL HEMORRHOIDS 12/27/2010  . Mild intermittent asthma 06/03/2010  . LUMBAR SPRAIN AND STRAIN 07/20/2008  . ANXIETY DEPRESSION 06/28/2008  . HYPERLIPIDEMIA 07/10/2007  . DEPRESSION 05/29/2007   Past Medical History:  Diagnosis Date  . Anxiety   . Depression   . Diverticulosis 03/2016   Mild, noted on colonoscopy  . High cholesterol   . History of kidney stones   .  Lichen sclerosus    Anus  . Low blood pressure reading   . OA (osteoarthritis)   . OSA on CPAP   . Perianal cyst   . Personal history of colonic polyps 04/06/2011  . PONV (postoperative nausea and vomiting)    after c section, did well after knee replacement  . PPD positive    Age 31 >> reports taking medicine for one year  . Pre-diabetes    per pt report  . Seasonal allergies     Past Surgical History:  Procedure Laterality Date  . ARTHROSCOPIC KNEE Left   . CESAREAN SECTION    . COLONOSCOPY W/ POLYPECTOMY  04/11/2016  . ENDOMETRIAL ABLATION    . GANGLION CYST EXCISION Right   . HEMORROIDECTOMY    . lichen sclerosus lesion excision     Anus  . TOTAL KNEE ARTHROPLASTY Left 03/03/2019   Procedure: TOTAL KNEE ARTHROPLASTY;  Surgeon: Melrose Nakayama, MD;  Location: [facility] ORS;  Service: Orthopedics;  Laterality: Left;  . TUBAL LIGATION      Current Facility-Administered Medications  Medication Dose Route Frequency Provider Last Rate Last Dose  . [START ON 09/15/2019] bupivacaine liposome (EXPAREL) 1.3 % injection 266 mg  20 mL Other Once Melrose Nakayama, MD      . Derrill Memo ON 09/15/2019] tranexamic acid (CYKLOKAPRON) 2,000 mg in sodium chloride 0.9 % 50 mL Topical Application  [id number] mg Topical To OR Melrose Nakayama, MD       Current Outpatient Medications  Medication Sig Dispense Refill Last Dose  . acetaminophen (TYLENOL) 500 MG tablet Take 1,000 mg by mouth every 6 (six) hours as needed for moderate pain.     Marland Kitchen  Calcium Carbonate-Vitamin D (CALCIUM-D) 600-400 MG-UNIT TABS Take 2 tablets by mouth at bedtime.      . clobetasol cream (TEMOVATE) [id number] % Apply 1 application topically 2 (two) times daily as needed (skin irritation).     Marland Kitchen loratadine (CLARITIN) 10 MG tablet Take 10 mg by mouth daily.      . Multiple Vitamins-Minerals (AIRBORNE PO) Take 2 tablets by mouth daily.     Marland Kitchen PROAIR HFA 108 (90 Base) MCG/ACT inhaler INHALE 2 PUFFS INTO THE LUNGS EVERY 6 HOURS AS NEEDED FOR WHEEZING OR  SHORTNESS OF BREATH (Patient taking differently: Inhale 2 puffs into the lungs every 6 (six) hours as needed for wheezing or shortness of breath. ) 8.5 g 0   . zolpidem (AMBIEN) 10 MG tablet Take 10 mg by mouth at bedtime.     Marland Kitchen aspirin EC 325 MG EC tablet Take 1 tablet (325 mg total) by mouth 2 (two) times daily after a meal. (Patient not taking: Reported on 09/09/2019) 30 tablet 0 Not Taking at Unknown time  . HYDROcodone-acetaminophen (NORCO/VICODIN) 5-325 MG tablet Take 1-2 tablets by mouth every 6 (six) hours as needed for moderate pain (pain score 4-6). (Patient not taking: Reported on 09/09/2019) 40 tablet 0 Not Taking at Unknown time  . tiZANidine (ZANAFLEX) 4 MG tablet Take 1 tablet (4 mg total) by mouth every 6 (six) hours as needed for muscle spasms. (Patient not taking: Reported on 09/09/2019) 40 tablet 1 Not Taking at Unknown time  . zolpidem (AMBIEN) 5 MG tablet 1 at bedtime as needed for sleep (Patient not taking: Reported on 09/09/2019) 90 tablet 1 Not Taking at Unknown time   Allergies  Allergen Reactions  . Codeine Nausea Only    Social History   Tobacco Use  . Smoking status: Current Some Day Smoker    Packs/day: 0.50    Years: 42.00    Pack years: 21.00    Types: Cigarettes    Last attempt to quit: 07/31/2019    Years since quitting: 0.1  . Smokeless tobacco: Never Used  . Tobacco comment: 1 pack per week on and off  Substance Use Topics  . Alcohol use: Yes    Alcohol/week: 0.0 standard drinks    Comment: rare    Family History  Problem Relation Age of Onset  . Colon polyps Mother   . Diabetes Mother   . Hypertension Mother   . Heart disease Mother   . Hyperlipidemia Mother   . Alcohol abuse Brother   . Hepatitis Brother        hep c  . Breast cancer Maternal Grandmother        breast  . Lung cancer Father   . Colon cancer Neg Hx      Review of Systems  Musculoskeletal: Positive for joint pain.       Right knee  All other systems reviewed and are  negative.   Objective:  Physical Exam  Constitutional: She is oriented to person, place, and time. She appears well-developed and well-nourished.  HENT:  Head: Normocephalic and atraumatic.  Eyes: Pupils are equal, round, and reactive to light.  Neck: Normal range of motion.  Cardiovascular: Normal rate and regular rhythm.  Respiratory: Effort normal.  GI: Soft.  Musculoskeletal:     Comments: Replaced left knee has a benign scar with no effusion and good stability in extension.  Her motion is about 0-110.  Opposite knee moves about 0-120.  She has medial joint line pain and crepitation.  Hip motion is full on both sides.  Straight leg raise is negative.  Sensation and motor function are intact in her feet with palpable pulses on both sides.    Neurological: She is alert and oriented to person, place, and time.  Skin: Skin is warm and dry.  Psychiatric: She has a normal mood and affect. Her behavior is normal. Judgment and thought content normal.    Vital signs in last 24 hours:    Labs:   Estimated body mass index is 34.58 kg/m as calculated from the following:   Height as of 09/11/19: 5\' 1"  (1.549 m).   Weight as of 09/11/19: 83 kg.   Imaging Review Plain radiographs demonstrate severe degenerative joint disease of the right knee(s). The overall alignment isneutral. The bone quality appears to be good for age and reported activity level.      Assessment/Plan:  End stage Primary arthritis, right knee   The patient history, physical examination, clinical judgment of the provider and imaging studies are consistent with end stage degenerative joint disease of the right knee(s) and total knee arthroplasty is deemed medically necessary. The treatment options including medical management, injection therapy arthroscopy and arthroplasty were discussed at length. The risks and benefits of total knee arthroplasty were presented and reviewed. The risks due to aseptic loosening,  infection, stiffness, patella tracking problems, thromboembolic complications and other imponderables were discussed. The patient acknowledged the explanation, agreed to proceed with the plan and consent was signed. Patient is being admitted for inpatient treatment for surgery, pain control, PT, OT, prophylactic antibiotics, VTE prophylaxis, progressive ambulation and ADL's and discharge planning. The patient is planning to be discharged home with home health services     Patient's anticipated LOS is less than 2 midnights, meeting these requirements: - Younger than 16 - Lives within 1 hour of care - Has a competent adult at home to recover with post-op recover - NO history of  - Chronic pain requiring opiods  - Diabetes  - Coronary Artery Disease  - Heart failure  - Heart attack  - Stroke  - DVT/VTE  - Cardiac arrhythmia  - Respiratory Failure/COPD  - Renal failure  - Anemia  - Advanced Liver disease

## 2019-09-15 ENCOUNTER — Ambulatory Visit (HOSPITAL_COMMUNITY): Payer: BC Managed Care – PPO | Admitting: Certified Registered Nurse Anesthetist

## 2019-09-15 ENCOUNTER — Ambulatory Visit (HOSPITAL_COMMUNITY): Payer: BC Managed Care – PPO | Admitting: Physician Assistant

## 2019-09-15 ENCOUNTER — Encounter (HOSPITAL_COMMUNITY): Payer: Self-pay | Admitting: *Deleted

## 2019-09-15 ENCOUNTER — Observation Stay (HOSPITAL_COMMUNITY)
Admission: RE | Admit: 2019-09-15 | Discharge: 2019-09-16 | Disposition: A | Discharge status: Home / Self Care | Payer: BC Managed Care – PPO | Attending: Orthopaedic Surgery | Admitting: Orthopaedic Surgery

## 2019-09-15 ENCOUNTER — Encounter (HOSPITAL_COMMUNITY)
Admission: RE | Disposition: A | Discharge status: Home / Self Care | Payer: Self-pay | Source: Home / Self Care | Attending: Orthopaedic Surgery

## 2019-09-15 ENCOUNTER — Other Ambulatory Visit: Payer: Self-pay

## 2019-09-15 DIAGNOSIS — Z87442 Personal history of urinary calculi: Secondary | ICD-10-CM | POA: Diagnosis not present

## 2019-09-15 DIAGNOSIS — G4733 Obstructive sleep apnea (adult) (pediatric): Secondary | ICD-10-CM | POA: Diagnosis not present

## 2019-09-15 DIAGNOSIS — R7303 Prediabetes: Secondary | ICD-10-CM | POA: Diagnosis not present

## 2019-09-15 DIAGNOSIS — Z79899 Other long term (current) drug therapy: Secondary | ICD-10-CM | POA: Diagnosis not present

## 2019-09-15 DIAGNOSIS — M1711 Unilateral primary osteoarthritis, right knee: Secondary | ICD-10-CM | POA: Diagnosis present

## 2019-09-15 DIAGNOSIS — Z8379 Family history of other diseases of the digestive system: Secondary | ICD-10-CM | POA: Diagnosis not present

## 2019-09-15 DIAGNOSIS — Z8249 Family history of ischemic heart disease and other diseases of the circulatory system: Secondary | ICD-10-CM | POA: Insufficient documentation

## 2019-09-15 DIAGNOSIS — F17210 Nicotine dependence, cigarettes, uncomplicated: Secondary | ICD-10-CM | POA: Diagnosis not present

## 2019-09-15 DIAGNOSIS — F419 Anxiety disorder, unspecified: Secondary | ICD-10-CM | POA: Insufficient documentation

## 2019-09-15 DIAGNOSIS — Z96652 Presence of left artificial knee joint: Secondary | ICD-10-CM | POA: Insufficient documentation

## 2019-09-15 DIAGNOSIS — Z885 Allergy status to narcotic agent status: Secondary | ICD-10-CM | POA: Diagnosis not present

## 2019-09-15 DIAGNOSIS — Z833 Family history of diabetes mellitus: Secondary | ICD-10-CM | POA: Insufficient documentation

## 2019-09-15 DIAGNOSIS — E7800 Pure hypercholesterolemia, unspecified: Secondary | ICD-10-CM | POA: Diagnosis not present

## 2019-09-15 DIAGNOSIS — F329 Major depressive disorder, single episode, unspecified: Secondary | ICD-10-CM | POA: Diagnosis not present

## 2019-09-15 DIAGNOSIS — Z811 Family history of alcohol abuse and dependence: Secondary | ICD-10-CM | POA: Diagnosis not present

## 2019-09-15 DIAGNOSIS — E785 Hyperlipidemia, unspecified: Secondary | ICD-10-CM | POA: Diagnosis not present

## 2019-09-15 DIAGNOSIS — E669 Obesity, unspecified: Secondary | ICD-10-CM | POA: Insufficient documentation

## 2019-09-15 DIAGNOSIS — K5790 Diverticulosis of intestine, part unspecified, without perforation or abscess without bleeding: Secondary | ICD-10-CM | POA: Insufficient documentation

## 2019-09-15 DIAGNOSIS — Z7982 Long term (current) use of aspirin: Secondary | ICD-10-CM | POA: Insufficient documentation

## 2019-09-15 DIAGNOSIS — Z86010 Personal history of colonic polyps: Secondary | ICD-10-CM | POA: Diagnosis not present

## 2019-09-15 DIAGNOSIS — Z801 Family history of malignant neoplasm of trachea, bronchus and lung: Secondary | ICD-10-CM | POA: Insufficient documentation

## 2019-09-15 DIAGNOSIS — M1990 Unspecified osteoarthritis, unspecified site: Secondary | ICD-10-CM | POA: Diagnosis not present

## 2019-09-15 DIAGNOSIS — Z8371 Family history of colonic polyps: Secondary | ICD-10-CM | POA: Diagnosis not present

## 2019-09-15 DIAGNOSIS — Z803 Family history of malignant neoplasm of breast: Secondary | ICD-10-CM | POA: Diagnosis not present

## 2019-09-15 DIAGNOSIS — J45909 Unspecified asthma, uncomplicated: Secondary | ICD-10-CM | POA: Diagnosis not present

## 2019-09-15 DIAGNOSIS — Z6834 Body mass index (BMI) 34.0-34.9, adult: Secondary | ICD-10-CM | POA: Insufficient documentation

## 2019-09-15 HISTORY — PX: TOTAL KNEE ARTHROPLASTY: SHX125

## 2019-09-15 LAB — TYPE AND SCREEN
ABO/RH(D): A POS
Antibody Screen: NEGATIVE

## 2019-09-15 SURGERY — ARTHROPLASTY, KNEE, TOTAL
Anesthesia: Spinal | Site: Knee | Laterality: Right

## 2019-09-15 MED ORDER — KETOROLAC TROMETHAMINE 15 MG/ML IJ SOLN
15.0000 mg | Freq: Four times a day (QID) | INTRAMUSCULAR | Status: DC
  Administered 2019-09-15 – 2019-09-16 (×3): 15 mg via INTRAVENOUS
  Filled 2019-09-15 (×3): qty 1

## 2019-09-15 MED ORDER — CHLORHEXIDINE GLUCONATE 4 % EX LIQD
60.0000 mL | Freq: Once | CUTANEOUS | Status: AC
  Administered 2019-09-15: 4 via TOPICAL

## 2019-09-15 MED ORDER — PROPOFOL 10 MG/ML IV BOLUS
INTRAVENOUS | Status: AC
  Filled 2019-09-15: qty 20

## 2019-09-15 MED ORDER — TRANEXAMIC ACID-NACL 1000-0.7 MG/100ML-% IV SOLN
1000.0000 mg | Freq: Once | INTRAVENOUS | Status: AC
  Administered 2019-09-15: 1000 mg via INTRAVENOUS
  Filled 2019-09-15: qty 100

## 2019-09-15 MED ORDER — ACETAMINOPHEN 325 MG PO TABS: 325.0000 mg | ORAL_TABLET | Freq: Four times a day (QID) | ORAL | Status: DC | PRN

## 2019-09-15 MED ORDER — ALBUTEROL SULFATE (2.5 MG/3ML) 0.083% IN NEBU: 2.5000 mg | INHALATION_SOLUTION | Freq: Four times a day (QID) | RESPIRATORY_TRACT | Status: DC | PRN

## 2019-09-15 MED ORDER — KETOROLAC TROMETHAMINE 30 MG/ML IJ SOLN: 30.0000 mg | Freq: Once | INTRAMUSCULAR | Status: DC | PRN

## 2019-09-15 MED ORDER — CEFAZOLIN SODIUM-DEXTROSE 2-4 GM/100ML-% IV SOLN
2.0000 g | INTRAVENOUS | Status: AC
  Administered 2019-09-15: 12:00:00 2 g via INTRAVENOUS
  Filled 2019-09-15: qty 100

## 2019-09-15 MED ORDER — ONDANSETRON HCL 4 MG/2ML IJ SOLN
INTRAMUSCULAR | Status: AC
  Filled 2019-09-15: qty 2

## 2019-09-15 MED ORDER — CEFAZOLIN SODIUM-DEXTROSE 2-4 GM/100ML-% IV SOLN
2.0000 g | Freq: Four times a day (QID) | INTRAVENOUS | Status: AC
  Administered 2019-09-15 (×2): 2 g via INTRAVENOUS
  Filled 2019-09-15 (×2): qty 100

## 2019-09-15 MED ORDER — MEPERIDINE HCL 50 MG/ML IJ SOLN: 6.2500 mg | INTRAMUSCULAR | Status: DC | PRN

## 2019-09-15 MED ORDER — ONDANSETRON HCL 4 MG/2ML IJ SOLN
INTRAMUSCULAR | Status: DC | PRN
  Administered 2019-09-15: 4 mg via INTRAVENOUS

## 2019-09-15 MED ORDER — BUPIVACAINE IN DEXTROSE 0.75-8.25 % IT SOLN
INTRATHECAL | Status: DC | PRN
  Administered 2019-09-15: 1.6 mL via INTRATHECAL

## 2019-09-15 MED ORDER — SODIUM CHLORIDE 0.9 % IJ SOLN
INTRAMUSCULAR | Status: DC | PRN
  Administered 2019-09-15: 30 mL

## 2019-09-15 MED ORDER — MORPHINE SULFATE (PF) 2 MG/ML IV SOLN: 0.5000 mg | INTRAVENOUS | Status: DC | PRN

## 2019-09-15 MED ORDER — ALUM & MAG HYDROXIDE-SIMETH 200-200-20 MG/5ML PO SUSP: 30.0000 mL | ORAL | Status: DC | PRN

## 2019-09-15 MED ORDER — BUPIVACAINE HCL 0.25 % IJ SOLN
INTRAMUSCULAR | Status: DC | PRN
  Administered 2019-09-15: 30 mL

## 2019-09-15 MED ORDER — ACETAMINOPHEN 500 MG PO TABS
500.0000 mg | ORAL_TABLET | Freq: Four times a day (QID) | ORAL | Status: DC
  Administered 2019-09-15: 500 mg via ORAL
  Filled 2019-09-15: qty 1

## 2019-09-15 MED ORDER — DIPHENHYDRAMINE HCL 12.5 MG/5ML PO ELIX: 12.5000 mg | ORAL_SOLUTION | ORAL | Status: DC | PRN

## 2019-09-15 MED ORDER — METOCLOPRAMIDE HCL 5 MG PO TABS: 5.0000 mg | ORAL_TABLET | Freq: Three times a day (TID) | ORAL | Status: DC | PRN

## 2019-09-15 MED ORDER — CLONIDINE HCL (ANALGESIA) 100 MCG/ML EP SOLN
EPIDURAL | Status: DC | PRN
  Administered 2019-09-15: 50 ug

## 2019-09-15 MED ORDER — HYDROCODONE-ACETAMINOPHEN 7.5-325 MG PO TABS: 1.0000 | ORAL_TABLET | ORAL | Status: DC | PRN

## 2019-09-15 MED ORDER — HYDROMORPHONE HCL 1 MG/ML IJ SOLN
INTRAMUSCULAR | Status: AC
  Filled 2019-09-15: qty 1

## 2019-09-15 MED ORDER — ROPIVACAINE HCL 5 MG/ML IJ SOLN
INTRAMUSCULAR | Status: DC | PRN
  Administered 2019-09-15 (×2): 5 mL via PERINEURAL

## 2019-09-15 MED ORDER — POVIDONE-IODINE 10 % EX SWAB: 2.0000 "application " | Freq: Once | CUTANEOUS | Status: DC

## 2019-09-15 MED ORDER — FENTANYL CITRATE (PF) 100 MCG/2ML IJ SOLN
50.0000 ug | INTRAMUSCULAR | Status: DC
  Administered 2019-09-15: 100 ug via INTRAVENOUS
  Filled 2019-09-15: qty 2

## 2019-09-15 MED ORDER — TRANEXAMIC ACID 1000 MG/10ML IV SOLN
INTRAVENOUS | Status: DC | PRN
  Administered 2019-09-15: 14:00:00 2000 mg via TOPICAL

## 2019-09-15 MED ORDER — PROPOFOL 500 MG/50ML IV EMUL
INTRAVENOUS | Status: DC | PRN
  Administered 2019-09-15: 50 ug/kg/min via INTRAVENOUS

## 2019-09-15 MED ORDER — SODIUM CHLORIDE (PF) 0.9 % IJ SOLN
INTRAMUSCULAR | Status: AC
  Filled 2019-09-15: qty 50

## 2019-09-15 MED ORDER — ONDANSETRON HCL 4 MG/2ML IJ SOLN
4.0000 mg | Freq: Four times a day (QID) | INTRAMUSCULAR | Status: DC | PRN
  Administered 2019-09-15: 17:00:00 4 mg via INTRAVENOUS
  Filled 2019-09-15: qty 2

## 2019-09-15 MED ORDER — BUPIVACAINE LIPOSOME 1.3 % IJ SUSP
INTRAMUSCULAR | Status: DC | PRN
  Administered 2019-09-15: 20 mL

## 2019-09-15 MED ORDER — ASPIRIN 81 MG PO CHEW
81.0000 mg | CHEWABLE_TABLET | Freq: Two times a day (BID) | ORAL | Status: DC
  Administered 2019-09-16: 08:00:00 81 mg via ORAL
  Filled 2019-09-15: qty 1

## 2019-09-15 MED ORDER — TRANEXAMIC ACID-NACL 1000-0.7 MG/100ML-% IV SOLN
1000.0000 mg | INTRAVENOUS | Status: AC
  Administered 2019-09-15: 1000 mg via INTRAVENOUS
  Filled 2019-09-15: qty 100

## 2019-09-15 MED ORDER — METHOCARBAMOL 500 MG PO TABS
500.0000 mg | ORAL_TABLET | Freq: Four times a day (QID) | ORAL | Status: DC | PRN
  Administered 2019-09-15 – 2019-09-16 (×3): 500 mg via ORAL
  Filled 2019-09-15 (×3): qty 1

## 2019-09-15 MED ORDER — LACTATED RINGERS IV SOLN
INTRAVENOUS | Status: DC
  Administered 2019-09-15 (×2): via INTRAVENOUS

## 2019-09-15 MED ORDER — HYDROMORPHONE HCL 1 MG/ML IJ SOLN
0.2500 mg | INTRAMUSCULAR | Status: DC | PRN
  Administered 2019-09-15 (×2): 0.5 mg via INTRAVENOUS

## 2019-09-15 MED ORDER — SODIUM CHLORIDE 0.9 % IV SOLN
INTRAVENOUS | Status: DC | PRN
  Administered 2019-09-15: 35 ug/min via INTRAVENOUS

## 2019-09-15 MED ORDER — MIDAZOLAM HCL 2 MG/2ML IJ SOLN
1.0000 mg | INTRAMUSCULAR | Status: DC
  Administered 2019-09-15: 2 mg via INTRAVENOUS
  Filled 2019-09-15: qty 2

## 2019-09-15 MED ORDER — HYDROCODONE-ACETAMINOPHEN 5-325 MG PO TABS
1.0000 | ORAL_TABLET | ORAL | Status: DC | PRN
  Administered 2019-09-15 – 2019-09-16 (×4): 2 via ORAL
  Filled 2019-09-15 (×4): qty 2

## 2019-09-15 MED ORDER — ONDANSETRON HCL 4 MG PO TABS: 4.0000 mg | ORAL_TABLET | Freq: Four times a day (QID) | ORAL | Status: DC | PRN

## 2019-09-15 MED ORDER — PROPOFOL 10 MG/ML IV BOLUS
INTRAVENOUS | Status: AC
  Filled 2019-09-15: qty 40

## 2019-09-15 MED ORDER — ROPIVACAINE HCL 7.5 MG/ML IJ SOLN
INTRAMUSCULAR | Status: DC | PRN
  Administered 2019-09-15 (×4): 5 mL via PERINEURAL

## 2019-09-15 MED ORDER — PROMETHAZINE HCL 25 MG/ML IJ SOLN: 6.2500 mg | INTRAMUSCULAR | Status: DC | PRN

## 2019-09-15 MED ORDER — BUPIVACAINE HCL (PF) 0.25 % IJ SOLN
INTRAMUSCULAR | Status: AC
  Filled 2019-09-15: qty 30

## 2019-09-15 MED ORDER — EPHEDRINE SULFATE-NACL 50-0.9 MG/10ML-% IV SOSY
PREFILLED_SYRINGE | INTRAVENOUS | Status: DC | PRN
  Administered 2019-09-15 (×4): 5 mg via INTRAVENOUS

## 2019-09-15 MED ORDER — METOCLOPRAMIDE HCL 5 MG/ML IJ SOLN: 5.0000 mg | Freq: Three times a day (TID) | INTRAMUSCULAR | Status: DC | PRN

## 2019-09-15 MED ORDER — EPHEDRINE 5 MG/ML INJ
INTRAVENOUS | Status: AC
  Filled 2019-09-15: qty 10

## 2019-09-15 MED ORDER — METHOCARBAMOL 500 MG IVPB - SIMPLE MED
500.0000 mg | Freq: Four times a day (QID) | INTRAVENOUS | Status: DC | PRN
  Filled 2019-09-15: qty 50

## 2019-09-15 MED ORDER — MENTHOL 3 MG MT LOZG: 1.0000 | LOZENGE | OROMUCOSAL | Status: DC | PRN

## 2019-09-15 MED ORDER — LORATADINE 10 MG PO TABS
10.0000 mg | ORAL_TABLET | Freq: Every day | ORAL | Status: DC
  Administered 2019-09-15 – 2019-09-16 (×2): 10 mg via ORAL
  Filled 2019-09-15 (×2): qty 1

## 2019-09-15 MED ORDER — 0.9 % SODIUM CHLORIDE (POUR BTL) OPTIME
TOPICAL | Status: DC | PRN
  Administered 2019-09-15: 1000 mL

## 2019-09-15 MED ORDER — PROPOFOL 10 MG/ML IV BOLUS
INTRAVENOUS | Status: AC
  Filled 2019-09-15: qty 60

## 2019-09-15 MED ORDER — DOCUSATE SODIUM 100 MG PO CAPS
100.0000 mg | ORAL_CAPSULE | Freq: Two times a day (BID) | ORAL | Status: DC
  Administered 2019-09-15 – 2019-09-16 (×2): 100 mg via ORAL
  Filled 2019-09-15 (×2): qty 1

## 2019-09-15 MED ORDER — LACTATED RINGERS IV SOLN
INTRAVENOUS | Status: DC
  Administered 2019-09-15: 17:00:00 via INTRAVENOUS

## 2019-09-15 MED ORDER — PHENOL 1.4 % MT LIQD: 1.0000 | OROMUCOSAL | Status: DC | PRN

## 2019-09-15 MED ORDER — ZOLPIDEM TARTRATE 5 MG PO TABS
5.0000 mg | ORAL_TABLET | Freq: Every day | ORAL | Status: DC
  Administered 2019-09-15: 5 mg via ORAL
  Filled 2019-09-15: qty 1

## 2019-09-15 MED ORDER — PROPOFOL 10 MG/ML IV BOLUS
INTRAVENOUS | Status: DC | PRN
  Administered 2019-09-15: 30 mg via INTRAVENOUS

## 2019-09-15 MED ORDER — PHENYLEPHRINE HCL (PRESSORS) 10 MG/ML IV SOLN
INTRAVENOUS | Status: AC
  Filled 2019-09-15: qty 1

## 2019-09-15 MED ORDER — BISACODYL 5 MG PO TBEC: 5.0000 mg | DELAYED_RELEASE_TABLET | Freq: Every day | ORAL | Status: DC | PRN

## 2019-09-15 SURGICAL SUPPLY — 55 items
ATTUNE PSFEM RTSZ5 NARCEM KNEE (Femur) ×2 IMPLANT
ATTUNE PSRP INSR SZ5 5 KNEE (Insert) ×1 IMPLANT
ATTUNE PSRP INSR SZ5 5MM KNEE (Insert) ×1 IMPLANT
BAG DECANTER FOR FLEXI CONT (MISCELLANEOUS) ×3 IMPLANT
BAG SPEC THK2 15X12 ZIP CLS (MISCELLANEOUS) ×1
BAG ZIPLOCK 12X15 (MISCELLANEOUS) ×3 IMPLANT
BASEPLATE TIBIAL ROTATING SZ 4 (Knees) ×2 IMPLANT
BLADE SAGITTAL 25.0X1.19X90 (BLADE) ×2 IMPLANT
BLADE SAGITTAL 25.0X1.19X90MM (BLADE) ×1
BLADE SAW SGTL 11.0X1.19X90.0M (BLADE) ×3 IMPLANT
BNDG ELASTIC 6X5.8 VLCR STR LF (GAUZE/BANDAGES/DRESSINGS) ×3 IMPLANT
BOOTIES KNEE HIGH SLOAN (MISCELLANEOUS) ×3 IMPLANT
BOWL SMART MIX CTS (DISPOSABLE) ×3 IMPLANT
BSPLAT TIB 4 CMNT ROT PLAT STR (Knees) ×1 IMPLANT
CEMENT HV SMART SET (Cement) ×6 IMPLANT
COVER SURGICAL LIGHT HANDLE (MISCELLANEOUS) ×3 IMPLANT
COVER WAND RF STERILE (DRAPES) IMPLANT
CUFF TOURN SGL QUICK 34 (TOURNIQUET CUFF) ×3
CUFF TRNQT CYL 34X4.125X (TOURNIQUET CUFF) ×1 IMPLANT
DECANTER SPIKE VIAL GLASS SM (MISCELLANEOUS) ×6 IMPLANT
DRAPE SHEET LG 3/4 BI-LAMINATE (DRAPES) ×5 IMPLANT
DRAPE TOP 10253 STERILE (DRAPES) ×3 IMPLANT
DRAPE U-SHAPE 47X51 STRL (DRAPES) ×3 IMPLANT
DRESSING AQUACEL AG SP 3.5X10 (GAUZE/BANDAGES/DRESSINGS) IMPLANT
DRSG AQUACEL AG ADV 3.5X10 (GAUZE/BANDAGES/DRESSINGS) ×3 IMPLANT
DRSG AQUACEL AG SP 3.5X10 (GAUZE/BANDAGES/DRESSINGS) ×3
DURAPREP 26ML APPLICATOR (WOUND CARE) ×6 IMPLANT
ELECT REM PT RETURN 15FT ADLT (MISCELLANEOUS) ×3 IMPLANT
GLOVE BIO SURGEON STRL SZ8 (GLOVE) ×6 IMPLANT
GLOVE BIOGEL PI IND STRL 8 (GLOVE) ×2 IMPLANT
GLOVE BIOGEL PI INDICATOR 8 (GLOVE) ×4
GOWN STRL REUS W/TWL XL LVL3 (GOWN DISPOSABLE) ×6 IMPLANT
HANDPIECE INTERPULSE COAX TIP (DISPOSABLE) ×3
HOLDER FOLEY CATH W/STRAP (MISCELLANEOUS) ×2 IMPLANT
HOOD PEEL AWAY FLYTE STAYCOOL (MISCELLANEOUS) ×9 IMPLANT
KIT TURNOVER KIT A (KITS) IMPLANT
MANIFOLD NEPTUNE II (INSTRUMENTS) ×3 IMPLANT
NS IRRIG 1000ML POUR BTL (IV SOLUTION) ×3 IMPLANT
PACK TOTAL KNEE CUSTOM (KITS) ×3 IMPLANT
PAD ARMBOARD 7.5X6 YLW CONV (MISCELLANEOUS) ×3 IMPLANT
PATELLA MEDIAL ATTUN 35MM KNEE (Knees) ×2 IMPLANT
PIN DRILL FIX HALF THREAD (BIT) ×2 IMPLANT
PIN STEINMAN FIXATION KNEE (PIN) ×2 IMPLANT
PROTECTOR NERVE ULNAR (MISCELLANEOUS) ×3 IMPLANT
SET HNDPC FAN SPRY TIP SCT (DISPOSABLE) ×1 IMPLANT
SUT ETHIBOND NAB CT1 #1 30IN (SUTURE) ×6 IMPLANT
SUT VIC AB 0 CT1 36 (SUTURE) ×3 IMPLANT
SUT VIC AB 2-0 CT1 27 (SUTURE) ×3
SUT VIC AB 2-0 CT1 TAPERPNT 27 (SUTURE) ×1 IMPLANT
SUT VIC AB 3-0 CT1 27 (SUTURE) ×3
SUT VIC AB 3-0 CT1 TAPERPNT 27 (SUTURE) ×1 IMPLANT
TRAY FOLEY MTR SLVR 14FR STAT (SET/KITS/TRAYS/PACK) ×2 IMPLANT
TRAY FOLEY MTR SLVR 16FR STAT (SET/KITS/TRAYS/PACK) IMPLANT
WATER STERILE IRR 1000ML POUR (IV SOLUTION) ×3 IMPLANT
WRAP KNEE MAXI GEL POST OP (GAUZE/BANDAGES/DRESSINGS) ×3 IMPLANT

## 2019-09-15 NOTE — Op Note (Signed)
PREOP DIAGNOSIS: DJD RIGHT KNEE POSTOP DIAGNOSIS: same PROCEDURE: RIGHT TKR ANESTHESIA: Spinal and MAC ATTENDING SURGEON: [name] ASSISTANT: Loni Dolly PA  INDICATIONS FOR PROCEDURE: Robin Small is a 58 y.o. female who has struggled for a long time with pain due to degenerative arthritis of the right knee.  The patient has failed many conservative non-operative measures and at this point has pain which limits the ability to sleep and walk.  The patient is offered total knee replacement.  Informed operative consent was obtained after discussion of possible risks of anesthesia, infection, neurovascular injury, DVT, and death.  The importance of the post-operative rehabilitation protocol to optimize result was stressed extensively with the patient.  SUMMARY OF FINDINGS AND PROCEDURE:  Robin Small was taken to the operative suite where under the above anesthesia a right knee replacement was performed.  There were advanced degenerative changes and the bone quality was good.  We used the DePuy Attune system and placed size 5 narrow femur, 4 tibia, 35 mm all polyethylene patella, and a size 5 mm spacer.  Loni Dolly PA-C assisted throughout and was invaluable to the completion of the case in that he helped retract and maintain exposure while I placed components.  He also helped close thereby minimizing OR time.  The patient was admitted for appropriate post-op care to include perioperative antibiotics and mechanical and pharmacologic measures for DVT prophylaxis.  DESCRIPTION OF PROCEDURE:  Robin Small was taken to the operative suite where the above anesthesia was applied.  The patient was positioned supine and prepped and draped in normal sterile fashion.  An appropriate time out was performed.  After the administration of kefzol pre-op antibiotic the leg was elevated and exsanguinated and a tourniquet inflated. A standard longitudinal incision was made on the anterior knee.  Dissection was  carried down to the extensor mechanism.  All appropriate anti-infective measures were used including the pre-operative antibiotic, betadine impregnated drape, and closed hooded exhaust systems for each member of the surgical team.  A medial parapatellar incision was made in the extensor mechanism and the knee cap flipped and the knee flexed.  Some residual meniscal tissues were removed along with any remaining ACL/PCL tissue.  A guide was placed on the tibia and a flat cut was made on it's superior surface.  An intramedullary guide was placed in the femur and was utilized to make anterior and posterior cuts creating an appropriate flexion gap.  A second intramedullary guide was placed in the femur to make a distal cut properly balancing the knee with an extension gap equal to the flexion gap.  The three bones sized to the above mentioned sizes and the appropriate guides were placed and utilized.  A trial reduction was done and the knee easily came to full extension and the patella tracked well on flexion.  The trial components were removed and all bones were cleaned with pulsatile lavage and then dried thoroughly.  Cement was mixed and was pressurized onto the bones followed by placement of the aforementioned components.  Excess cement was trimmed and pressure was held on the components until the cement had hardened.  The tourniquet was deflated and a small amount of bleeding was controlled with cautery and pressure.  The knee was irrigated thoroughly.  The extensor mechanism was re-approximated with #1 ethibond in interrupted fashion.  The knee was flexed and the repair was solid.  The subcutaneous tissues were re-approximated with #0 and #2-0 vicryl and the skin closed  with a subcuticular stitch and steristrips.  A sterile dressing was applied.  Intraoperative fluids, EBL, and tourniquet time can be obtained from anesthesia records.  DISPOSITION:  The patient was taken to recovery room in stable condition and  admitted for appropriate post-op care to include peri-operative antibiotic and DVT prophylaxis with mechanical and pharmacologic measures.  Robin Small 09/15/2019, 2:06 PM

## 2019-09-15 NOTE — Transfer of Care (Signed)
Immediate Anesthesia Transfer of Care Note  Patient: Robin Small  Procedure(s) Performed: RIGHT TOTAL KNEE ARTHROPLASTY (Right Knee)  Patient Location: PACU  Anesthesia Type:Regional and Spinal  Level of Consciousness: awake, alert  and oriented  Airway & Oxygen Therapy: Patient Spontanous Breathing and Patient connected to face mask oxygen  Post-op Assessment: Report given to RN and Post -op Vital signs reviewed and stable  Post vital signs: Reviewed and stable  Last Vitals:  Vitals Value Taken Time  BP    Temp    Pulse    Resp    SpO2      Last Pain:  Vitals:   09/15/19 1122  TempSrc:   PainSc: 5       Patients Stated Pain Goal: 3 ([id number])  Complications: No apparent anesthesia complications

## 2019-09-15 NOTE — Anesthesia Procedure Notes (Signed)
Spinal  Patient location during procedure: OR End time: 09/15/2019 12:28 PM Staffing Anesthesiologist: Lyn Hollingshead, MD Resident/CRNA: Maxwell Caul, CRNA Performed: resident/CRNA  Preanesthetic Checklist Completed: patient identified, site marked, surgical consent, pre-op evaluation, timeout performed, IV checked, risks and benefits discussed and monitors and equipment checked Spinal Block Patient position: sitting Prep: DuraPrep Patient monitoring: heart rate, cardiac monitor, continuous pulse ox and blood pressure Approach: midline Location: L3-4 Injection technique: single-shot Needle Needle type: Pencan  Needle gauge: 24 G Needle length: 10 cm Assessment Sensory level: T4 Additional Notes IV functioning, monitors applied to pt. Expiration date of kit checked and confirmed to be in date. Sterile prep and drape, hand hygiene and sterile gloved used. Pt was positioned and spine was prepped in sterile fashion. Skin was anesthetized with lidocaine. Free flow of clear CSF obtained prior to injecting local anesthetic into CSF x 1 attempt. Spinal needle aspirated freely following injection. Needle was carefully withdrawn, and pt tolerated procedure well. Loss of motor and sensory on exam post injection. Dr Jillyn Hidden at bedside during entire placement.

## 2019-09-15 NOTE — Anesthesia Procedure Notes (Addendum)
Anesthesia Regional Block: Adductor canal block   Pre-Anesthetic Checklist: ,, timeout performed, Correct Patient, Correct Site, Correct Laterality, Correct Procedure, Correct Position, site marked, Risks and benefits discussed,  Surgical consent,  Pre-op evaluation,  At surgeon's request and post-op pain management  Laterality: Lower and Right  Prep: chloraprep       Needles:  Injection technique: Single-shot  Needle Type: Echogenic Stimulator Needle     Needle Length: 10cm  Needle Gauge: 21   Needle insertion depth: 3 cm   Additional Needles:   Procedures:,,,, ultrasound used (permanent image in chart),,,,  Narrative:  Start time: 09/15/2019 11:40 AM End time: 09/15/2019 11:50 AM Injection made incrementally with aspirations every 5 mL.  Performed by: Personally  Anesthesiologist: Lyn Hollingshead, MD

## 2019-09-15 NOTE — Anesthesia Procedure Notes (Signed)
Procedure Name: MAC Date/Time: 09/15/2019 12:21 PM Performed by: Maxwell Caul, CRNA Pre-anesthesia Checklist: Patient identified, Emergency Drugs available, Suction available and Patient being monitored Oxygen Delivery Method: Simple face mask

## 2019-09-15 NOTE — Interval H&P Note (Signed)
History and Physical Interval Note:  09/15/2019 11:26 AM  Robin Small  has presented today for surgery, with the diagnosis of RIGHT KNEE DEGENERATIVE JOINT DISEASE.  The various methods of treatment have been discussed with the patient and family. After consideration of risks, benefits and other options for treatment, the patient has consented to  Procedure(s): RIGHT TOTAL KNEE ARTHROPLASTY (Right) as a surgical intervention.  The patient's history has been reviewed, patient examined, no change in status, stable for surgery.  I have reviewed the patient's chart and labs.  Questions were answered to the patient's satisfaction.     [name]

## 2019-09-15 NOTE — Anesthesia Postprocedure Evaluation (Signed)
Anesthesia Post Note  Patient: TAIRA NEMER  Procedure(s) Performed: RIGHT TOTAL KNEE ARTHROPLASTY (Right Knee)     Patient location during evaluation: PACU Anesthesia Type: Spinal Level of consciousness: awake Pain management: pain level controlled Vital Signs Assessment: post-procedure vital signs reviewed and stable Respiratory status: spontaneous breathing Cardiovascular status: stable Postop Assessment: no headache, no backache, spinal receding, patient able to bend at knees and no apparent nausea or vomiting Anesthetic complications: no    Last Vitals:  Vitals:   09/15/19 1604 09/15/19 1826  BP: 94/72 90/61  Pulse: (!) 59 (!) 59  Resp: 15 16  Temp:  36.4 C  SpO2: 98% 100%    Last Pain:  Vitals:   09/15/19 2002  TempSrc:   PainSc: 4    Pain Goal: Patients Stated Pain Goal: 3 (09/15/19 1832)  LLE Motor Response: Purposeful movement (09/15/19 1952) LLE Sensation: Full sensation (09/15/19 1952) RLE Motor Response: Purposeful movement (09/15/19 1952) RLE Sensation: Full sensation (09/15/19 1952) L Sensory Level: S1-Sole of foot, small toes (09/15/19 1952) R Sensory Level: S1-Sole of foot, small toes (09/15/19 1952)    Huston Foley

## 2019-09-15 NOTE — Evaluation (Signed)
Physical Therapy Evaluation Patient Details Name: Robin Small MRN: [id number] DOB: 1961/02/16 Today's Date: 09/15/2019   History of Present Illness  Patient is 58 y.o. female s/p Rt TKA on 09/15/19 with PMH significant for OA, pre-diabetes, and Lt TKA on 03/03/19.  Clinical Impression  Robin Small is a 58 y.o. female POD 0 s/p Rt TKA. Patient reports independence with mobility at baseline. Patient is now limited by functional impairments (see PT problem list below) and requires min assist for transfers and gait with RW. Patient was able to ambulate ~80 feet with RW and required cues throughout to improve safe management with walker. Patient instructed in exercise to facilitate ROM and circulation to manage edema. Patient will benefit from continued skilled PT interventions to address impairments and progress towards PLOF. Acute PT will follow to progress mobility and stair training in preparation for safe discharge home.     Follow Up Recommendations Follow surgeon's recommendation for DC plan and follow-up therapies    Equipment Recommendations  None recommended by PT    Recommendations for Other Services       Precautions / Restrictions Precautions Precautions: Fall Restrictions Weight Bearing Restrictions: No RLE Weight Bearing: Weight bearing as tolerated      Mobility  Bed Mobility Overal bed mobility: Needs Assistance Bed Mobility: Supine to Sit     Supine to sit: HOB elevated;Supervision     General bed mobility comments: pt using bed rails, no cues needed and pt able to manage LE's without assist  Transfers Overall transfer level: Needs assistance Equipment used: Rolling walker (2 wheeled) Transfers: Sit to/from Stand Sit to Stand: Min assist         General transfer comment: cues for safe hand placement and technique; min assist to steady throughout rise, pt able to initiate power up without assist  Ambulation/Gait Ambulation/Gait assistance: Min  assist Gait Distance (Feet): 80 Feet Assistive device: Rolling walker (2 wheeled) Gait Pattern/deviations: Step-through pattern;Decreased step length - left;Decreased stance time - right;Decreased stride length Gait velocity: decreased   General Gait Details: pt required cues for safe hand placement on RW and to maintain safe proximety. pt with tendency to step beyond front of RW requiring cues to move walker further ahead while ambulating, no overt LOB noted during ambulation  Stairs            Wheelchair Mobility    Modified Rankin (Stroke Patients Only)       Balance Overall balance assessment: Needs assistance Sitting-balance support: No upper extremity supported;Feet supported Sitting balance-Leahy Scale: Good     Standing balance support: During functional activity;Bilateral upper extremity supported Standing balance-Leahy Scale: Fair Standing balance comment: pt abel to perform static stand without support, requires support fro dynamic activties and gait.                  Pertinent Vitals/Pain Pain Assessment: No/denies pain    Home Living Family/patient expects to be discharged to:: Private residence Living Arrangements: Spouse/significant other;Children;Other relatives Available Help at Discharge: Family;Available 24 hours/day(husband self employed so he is at home and pt has daughter and son who can help her at home) Type of Home: House Home Access: Stairs to enter Entrance Stairs-Rails: None Entrance Stairs-Number of Steps: 1 threshold at [place]: One level Home Equipment: Tub bench;Walker - 2 wheels;Bedside commode      Prior Function Level of Independence: Independent               Hand Dominance  Extremity/Trunk Assessment   Upper Extremity Assessment Upper Extremity Assessment: Overall WFL for tasks assessed    Lower Extremity Assessment Lower Extremity Assessment: RLE deficits/detail RLE Deficits / Details:  pt with good quad activation and no buckling noted in WB RLE Sensation: WNL RLE Coordination: WNL    Cervical / Trunk Assessment Cervical / Trunk Assessment: Normal  Communication   Communication: No difficulties  Cognition Arousal/Alertness: Awake/alert Behavior During Therapy: WFL for tasks assessed/performed Overall Cognitive Status: Within Functional Limits for tasks assessed             General Comments      Exercises Total Joint Exercises Ankle Circles/Pumps: AROM;10 reps;Seated;Both Quad Sets: AROM;Right;Seated;5 reps Heel Slides: AROM;5 reps;Right;Seated   Assessment/Plan    PT Assessment Patient needs continued PT services  PT Problem List Decreased strength;Decreased balance;Decreased range of motion;Decreased mobility;Decreased activity tolerance;Decreased knowledge of use of DME       PT Treatment Interventions DME instruction;Functional mobility training;Balance training;Patient/family education;Modalities;Gait training;Therapeutic activities;Stair training;Therapeutic exercise    PT Goals (Current goals can be found in the Care Plan section)  Acute Rehab PT Goals Patient Stated Goal: to improve mobiltiy and return home PT Goal Formulation: With patient Time For Goal Achievement: 09/22/19 Potential to Achieve Goals: Good    Frequency 7X/week    AM-PAC PT "6 Clicks" Mobility  Outcome Measure Help needed turning from your back to your side while in a flat bed without using bedrails?: A Little Help needed moving from lying on your back to sitting on the side of a flat bed without using bedrails?: A Little Help needed moving to and from a bed to a chair (including a wheelchair)?: A Little Help needed standing up from a chair using your arms (e.g., wheelchair or bedside chair)?: A Little Help needed to walk in hospital room?: A Little Help needed climbing 3-5 steps with a railing? : A Little 6 Click Score: 18    End of Session Equipment Utilized During  Treatment: Gait belt Activity Tolerance: Patient tolerated treatment well Patient left: in chair;with call bell/phone within reach;with family/visitor present Nurse Communication: Mobility status PT Visit Diagnosis: Unsteadiness on feet (R26.81);Other abnormalities of gait and mobility (R26.89);Muscle weakness (generalized) (M62.81);Difficulty in walking, not elsewhere classified (R26.2)    Time: [id number] PT Time Calculation (min) (ACUTE ONLY): 19 min   Charges:   PT Evaluation $PT Eval Low Complexity: 1 Low         Kipp Brood, PT, DPT, [facility] Physical Therapist with [facility]  09/15/2019 7:17 PM

## 2019-09-15 NOTE — Anesthesia Preprocedure Evaluation (Signed)
Anesthesia Evaluation  Patient identified by MRN, date of birth, ID band Patient awake    Reviewed: Allergy & Precautions, NPO status , Patient's Chart, lab work & pertinent test results  History of Anesthesia Complications (+) PONV and history of anesthetic complications  Airway Mallampati: II  TM Distance: >3 FB Neck ROM: Full    Dental  (+) Dental Advisory Given, Missing,    Pulmonary asthma , sleep apnea and Continuous Positive Airway Pressure Ventilation , Current Smoker and Patient abstained from smoking., former smoker,    Pulmonary exam normal breath sounds clear to auscultation       Cardiovascular negative cardio ROS Normal cardiovascular exam Rhythm:Regular Rate:Normal     Neuro/Psych PSYCHIATRIC DISORDERS Anxiety Depression negative neurological ROS     GI/Hepatic negative GI ROS, Neg liver ROS,   Endo/Other  negative endocrine ROS  Renal/GU negative Renal ROS     Musculoskeletal  (+) Arthritis , Osteoarthritis,    Abdominal (+) + obese,   Peds  Hematology negative hematology ROS (+)   Anesthesia Other Findings Day of surgery medications reviewed with the patient.  Reproductive/Obstetrics                             Anesthesia Physical  Anesthesia Plan  ASA: II  Anesthesia Plan: Spinal   Post-op Pain Management:  Regional for Post-op pain   Induction:   PONV Risk Score and Plan: 3 and Treatment may vary due to age or medical condition, Ondansetron, Propofol infusion, Dexamethasone and Midazolam  Airway Management Planned: Natural Airway, Simple Face Mask and Nasal Cannula  Additional Equipment:   Intra-op Plan:   Post-operative Plan:   Informed Consent: I have reviewed the patients History and Physical, chart, labs and discussed the procedure including the risks, benefits and alternatives for the proposed anesthesia with the patient or authorized representative  who has indicated his/her understanding and acceptance.       Plan Discussed with: CRNA  Anesthesia Plan Comments:         Anesthesia Quick Evaluation

## 2019-09-15 NOTE — Progress Notes (Signed)
Assisted Dr. Hatchett with right, ultrasound guided, adductor canal block. Side rails up, monitors on throughout procedure. See vital signs in flow sheet. Tolerated Procedure well.  

## 2019-09-16 ENCOUNTER — Encounter (HOSPITAL_COMMUNITY): Payer: Self-pay | Admitting: Orthopaedic Surgery

## 2019-09-16 DIAGNOSIS — M1711 Unilateral primary osteoarthritis, right knee: Secondary | ICD-10-CM | POA: Diagnosis not present

## 2019-09-16 MED ORDER — ASPIRIN 81 MG PO CHEW: 81.0000 mg | CHEWABLE_TABLET | Freq: Two times a day (BID) | ORAL | 0 refills | Status: DC

## 2019-09-16 MED ORDER — HYDROCODONE-ACETAMINOPHEN 5-325 MG PO TABS: 1.0000 | ORAL_TABLET | ORAL | 0 refills | Status: DC | PRN

## 2019-09-16 MED ORDER — TIZANIDINE HCL 4 MG PO TABS: 4.0000 mg | ORAL_TABLET | Freq: Four times a day (QID) | ORAL | 1 refills | Status: AC | PRN

## 2019-09-16 NOTE — Progress Notes (Signed)
Physical Therapy Treatment Patient Details Name: Robin Small MRN: [id number] DOB: August 08, 1961 Today's Date: 09/16/2019    History of Present Illness Patient is 58 y.o. female s/p Rt TKA on 09/15/19 with PMH significant for OA, pre-diabetes, and Lt TKA on 03/03/19.    PT Comments    Pt has met PT goals and is ready to DC home from PT standpoint. She ambulated 180' with RW and demonstrates understanding of HEP.    Follow Up Recommendations  Follow surgeon's recommendation for DC plan and follow-up therapies     Equipment Recommendations  None recommended by PT    Recommendations for Other Services       Precautions / Restrictions Precautions Precautions: Fall Restrictions Weight Bearing Restrictions: No RLE Weight Bearing: Weight bearing as tolerated    Mobility  Bed Mobility               General bed mobility comments: up in chair  Transfers Overall transfer level: Modified independent Equipment used: Rolling walker (2 wheeled) Transfers: Sit to/from Stand Sit to Stand: Modified independent (Device/Increase time)         General transfer comment: good safety awareness  Ambulation/Gait Ambulation/Gait assistance: Modified independent (Device/Increase time) Gait Distance (Feet): 180 Feet Assistive device: Rolling walker (2 wheeled) Gait Pattern/deviations: Step-through pattern;Decreased stride length     General Gait Details: no loss of balance   Stairs             Wheelchair Mobility    Modified Rankin (Stroke Patients Only)       Balance Overall balance assessment: Needs assistance Sitting-balance support: No upper extremity supported;Feet supported Sitting balance-Leahy Scale: Good     Standing balance support: During functional activity;Bilateral upper extremity supported Standing balance-Leahy Scale: Fair                              Cognition Arousal/Alertness: Awake/alert Behavior During Therapy: WFL for tasks  assessed/performed Overall Cognitive Status: Within Functional Limits for tasks assessed                                        Exercises Total Joint Exercises Ankle Circles/Pumps: AROM;10 reps;Seated;Both Quad Sets: AROM;Right;Seated;10 reps Short Arc Quad: AROM;Right;10 reps Heel Slides: AROM;Right;Seated;10 reps Hip ABduction/ADduction: AROM;Right;10 reps;Supine Straight Leg Raises: AROM;Right;10 reps;Supine Long Arc Quad: AROM;Right;10 reps;Seated Knee Flexion: AROM;AAROM;Right;10 reps;Seated Goniometric ROM: 0-50* AAROM R knee    General Comments        Pertinent Vitals/Pain Pain Assessment: No/denies pain    Home Living                      Prior Function            PT Goals (current goals can now be found in the care plan section) Acute Rehab PT Goals Patient Stated Goal: to improve mobiltiy and return home PT Goal Formulation: With patient Time For Goal Achievement: 09/22/19 Potential to Achieve Goals: Good Progress towards PT goals: Goals met/education completed, patient discharged from PT    Frequency    7X/week      PT Plan Current plan remains appropriate    Co-evaluation              AM-PAC PT "6 Clicks" Mobility   Outcome Measure  Help needed turning from your back to your side while in  a flat bed without using bedrails?: None Help needed moving from lying on your back to sitting on the side of a flat bed without using bedrails?: None Help needed moving to and from a bed to a chair (including a wheelchair)?: None Help needed standing up from a chair using your arms (e.g., wheelchair or bedside chair)?: None Help needed to walk in hospital room?: None Help needed climbing 3-5 steps with a railing? : None 6 Click Score: 24    End of Session Equipment Utilized During Treatment: Gait belt Activity Tolerance: Patient tolerated treatment well Patient left: in chair;with call bell/phone within reach Nurse  Communication: Mobility status PT Visit Diagnosis: Unsteadiness on feet (R26.81);Other abnormalities of gait and mobility (R26.89);Muscle weakness (generalized) (M62.81);Difficulty in walking, not elsewhere classified (R26.2)     Time: 1044-1100 PT Time Calculation (min) (ACUTE ONLY): 16 min  Charges:  $Gait Training: 8-22 mins                     Blondell Reveal Kistler PT 09/16/2019  Acute Rehabilitation Services Pager [phone] Office [phone]

## 2019-09-16 NOTE — Plan of Care (Signed)
  Problem: Education: Goal: Knowledge of the prescribed therapeutic regimen will improve Outcome: Progressing   Problem: Activity: Goal: Range of joint motion will improve Outcome: Progressing   Problem: Pain Management: Goal: Pain level will decrease with appropriate interventions Outcome: Progressing   Problem: Education: Goal: Knowledge of General Education information will improve Description: Including pain rating scale, medication(s)/side effects and non-pharmacologic comfort measures Outcome: Progressing   Problem: Nutrition: Goal: Adequate nutrition will be maintained Outcome: Progressing

## 2019-09-16 NOTE — Progress Notes (Signed)
Pt . Wake up and c/o that she was not able to sleep., That she was disturb several times during night.Engineer, technical sales educated patient again that we have to check on her during night, check her v/s, draw some blood. Pt took Ambien HS and was able to sleep a few hours. Also pt was educated that she can't use pillows under her operated knee . Pt used the pillows for her back. Pt is awake at this time, CPAP is off.

## 2019-09-16 NOTE — Discharge Summary (Signed)
Patient ID: Robin Small MRN: [id number] DOB/AGE: 1961-06-06 58 y.o.  Admit date: 09/15/2019 Discharge date: 09/16/2019  Admission Diagnoses:  Principal Problem:   Primary osteoarthritis of right knee   Discharge Diagnoses:  Same  Past Medical History:  Diagnosis Date  . Anxiety   . Depression   . Diverticulosis 03/2016   Mild, noted on colonoscopy  . High cholesterol   . History of kidney stones   . Lichen sclerosus    Anus  . Low blood pressure reading   . OA (osteoarthritis)   . OSA on CPAP   . Perianal cyst   . Personal history of colonic polyps 04/06/2011  . PONV (postoperative nausea and vomiting)    after c section, did well after knee replacement  . PPD positive    Age 17 >> reports taking medicine for one year  . Pre-diabetes    per pt report  . Seasonal allergies     Surgeries: Procedure(s): RIGHT TOTAL KNEE ARTHROPLASTY on 09/15/2019   Consultants:   Discharged Condition: Improved  Hospital Course: Robin Small is an 58 y.o. female who was admitted 09/15/2019 for operative treatment ofPrimary osteoarthritis of right knee. Patient has severe unremitting pain that affects sleep, daily activities, and work/hobbies. After pre-op clearance the patient was taken to the operating room on 09/15/2019 and underwent  Procedure(s): RIGHT TOTAL KNEE ARTHROPLASTY.    Patient was given perioperative antibiotics:  Anti-infectives (From admission, onward)   Start     Dose/Rate Route Frequency Ordered Stop   09/15/19 1800  ceFAZolin (ANCEF) IVPB 2g/100 mL premix     2 g 200 mL/hr over 30 Minutes Intravenous Every 6 hours 09/15/19 1615 09/16/19 0009   09/15/19 1030  ceFAZolin (ANCEF) IVPB 2g/100 mL premix     2 g 200 mL/hr over 30 Minutes Intravenous On call to O.R. 09/15/19 1028 09/15/19 1244       Patient was given sequential compression devices, early ambulation, and chemoprophylaxis to prevent DVT.  Patient benefited maximally from hospital stay and there were  no complications.    Recent vital signs:  Patient Vitals for the past 24 hrs:  BP Temp Temp src Pulse Resp SpO2 Height Weight  09/16/19 0434 (!) 92/53 98 F (36.7 C) - 63 16 95 % - -  09/16/19 0141 (!) 99/56 97.8 F (36.6 C) - (!) 57 16 96 % - -  09/15/19 2055 92/66 97.6 F (36.4 C) - (!) 58 18 99 % - -  09/15/19 1826 90/61 97.6 F (36.4 C) - (!) 59 16 100 % - -  09/15/19 1604 94/72 - - (!) 59 15 98 % - -  09/15/19 1545 (!) 98/58 98.1 F (36.7 C) - (!) 57 12 100 % - -  09/15/19 1530 (!) 97/58 - - (!) 58 13 100 % - -  09/15/19 1515 (!) 91/57 - - (!) 55 16 100 % - -  09/15/19 1500 (!) 88/53 - - - 16 - - -  09/15/19 1445 (!) 93/54 - - 63 - 100 % - -  09/15/19 1437 98/60 97.7 F (36.5 C) - (!) 58 16 99 % - -  09/15/19 1200 100/70 - - (!) 57 12 100 % - -  09/15/19 1155 112/71 - - 61 14 100 % - -  09/15/19 1150 - - - 60 15 100 % - -  09/15/19 1145 101/70 - - 61 16 99 % - -  09/15/19 1140 100/69 - - 69 18 100 % - -  09/15/19 1122 - - - - - - 5\' 1"  (1.549 m) 83 kg  09/15/19 1052 103/76 98.3 F (36.8 C) Oral 62 17 98 % - -     Recent laboratory studies: No results for input(s): WBC, HGB, HCT, PLT, NA, K, CL, CO2, BUN, CREATININE, GLUCOSE, INR, CALCIUM in the last 72 hours.  Invalid input(s): PT, 2   Discharge Medications:   Allergies as of 09/16/2019      Reactions   Codeine Nausea Only      Medication List    STOP taking these medications   aspirin 325 MG EC tablet Replaced by: aspirin 81 MG chewable tablet     TAKE these medications   acetaminophen 500 MG tablet Commonly known as: TYLENOL Take 1,000 mg by mouth every 6 (six) hours as needed for moderate pain.   AIRBORNE PO Take 2 tablets by mouth daily.   aspirin 81 MG chewable tablet Chew 1 tablet (81 mg total) by mouth 2 (two) times daily. Replaces: aspirin 325 MG EC tablet   Calcium-D 600-400 MG-UNIT Tabs Take 2 tablets by mouth at bedtime.   Claritin 10 MG tablet Generic drug: loratadine Take 10 mg by  mouth daily.   clobetasol cream 0.05 % Commonly known as: TEMOVATE Apply 1 application topically 2 (two) times daily as needed (skin irritation).   HYDROcodone-acetaminophen 5-325 MG tablet Commonly known as: NORCO/VICODIN Take 1-2 tablets by mouth every 4 (four) hours as needed for moderate pain (pain score 4-6). What changed: when to take this   ProAir HFA 108 (90 Base) MCG/ACT inhaler Generic drug: albuterol INHALE 2 PUFFS INTO THE LUNGS EVERY 6 HOURS AS NEEDED FOR WHEEZING OR SHORTNESS OF BREATH What changed: See the new instructions.   tiZANidine 4 MG tablet Commonly known as: Zanaflex Take 1 tablet (4 mg total) by mouth every 6 (six) hours as needed for muscle spasms.   zolpidem 10 MG tablet Commonly known as: AMBIEN Take 10 mg by mouth at bedtime.   zolpidem 5 MG tablet Commonly known as: AMBIEN 1 at bedtime as needed for sleep            Durable Medical Equipment  (From admission, onward)         Start     Ordered   09/15/19 1616  DME Walker rolling  Once    Question:  Patient needs a walker to treat with the following condition  Answer:  Primary osteoarthritis of right knee   09/15/19 1615   09/15/19 1616  DME 3 n 1  Once     09/15/19 1615   09/15/19 1616  DME Bedside commode  Once    Question:  Patient needs a bedside commode to treat with the following condition  Answer:  Primary osteoarthritis of right knee   09/15/19 1615          Diagnostic Studies: Dg Chest 2 View  Result Date: 09/11/2019 CLINICAL DATA:  Preoperative, knee replacement EXAM: CHEST - 2 VIEW COMPARISON:  01/08/2013 FINDINGS: The heart size and mediastinal contours are within normal limits. Both lungs are clear. The visualized skeletal structures are unremarkable. IMPRESSION: No acute abnormality of the lungs. Electronically Signed   By: Eddie Candle M.D.   On: 09/11/2019 15:24    Disposition: Discharge disposition: 01-Home or Self Care       Discharge Instructions    Call  MD / Call 911   Complete by: As directed    If you experience chest pain or shortness  of breath, CALL 911 and be transported to the hospital emergency room.  If you develope a fever above 101 F, pus (white drainage) or increased drainage or redness at the wound, or calf pain, call your surgeon's office.   Constipation Prevention   Complete by: As directed    Drink plenty of fluids.  Prune juice may be helpful.  You may use a stool softener, such as Colace (over the counter) 100 mg twice a day.  Use MiraLax (over the counter) for constipation as needed.   Diet - low sodium heart healthy   Complete by: As directed    Discharge instructions   Complete by: As directed    INSTRUCTIONS AFTER JOINT REPLACEMENT   Remove items at home which could result in a fall. This includes throw rugs or furniture in walking pathways ICE to the affected joint every three hours while awake for 30 minutes at a time, for at least the first 3-5 days, and then as needed for pain and swelling.  Continue to use ice for pain and swelling. You may notice swelling that will progress down to the foot and ankle.  This is normal after surgery.  Elevate your leg when you are not up walking on it.   Continue to use the breathing machine you got in the hospital (incentive spirometer) which will help keep your temperature down.  It is common for your temperature to cycle up and down following surgery, especially at night when you are not up moving around and exerting yourself.  The breathing machine keeps your lungs expanded and your temperature down.   DIET:  As you were doing prior to hospitalization, we recommend a well-balanced diet.  DRESSING / WOUND CARE / SHOWERING  You may shower 3 days after surgery, but keep the wounds dry during showering.  You may use an occlusive plastic wrap (Press'n Seal for example), NO SOAKING/SUBMERGING IN THE BATHTUB.  If the bandage gets wet, change with a clean dry gauze.  If the incision gets  wet, pat the wound dry with a clean towel.  ACTIVITY  Increase activity slowly as tolerated, but follow the weight bearing instructions below.   No driving for 6 weeks or until further direction given by your physician.  You cannot drive while taking narcotics.  No lifting or carrying greater than 10 lbs. until further directed by your surgeon. Avoid periods of inactivity such as sitting longer than an hour when not asleep. This helps prevent blood clots.  You may return to work once you are authorized by your doctor.     WEIGHT BEARING   Weight bearing as tolerated with assist device (walker, cane, etc) as directed, use it as long as suggested by your surgeon or therapist, typically at least 4-6 weeks.   EXERCISES  Results after joint replacement surgery are often greatly improved when you follow the exercise, range of motion and muscle strengthening exercises prescribed by your doctor. Safety measures are also important to protect the joint from further injury. Any time any of these exercises cause you to have increased pain or swelling, decrease what you are doing until you are comfortable again and then slowly increase them. If you have problems or questions, call your caregiver or physical therapist for advice.   Rehabilitation is important following a joint replacement. After just a few days of immobilization, the muscles of the leg can become weakened and shrink (atrophy).  These exercises are designed to build up the tone and strength  of the thigh and leg muscles and to improve motion. Often times heat used for twenty to thirty minutes before working out will loosen up your tissues and help with improving the range of motion but do not use heat for the first two weeks following surgery (sometimes heat can increase post-operative swelling).   These exercises can be done on a training (exercise) mat, on the floor, on a table or on a bed. Use whatever works the best and is most  comfortable for you.    Use music or television while you are exercising so that the exercises are a pleasant break in your day. This will make your life better with the exercises acting as a break in your routine that you can look forward to.   Perform all exercises about fifteen times, three times per day or as directed.  You should exercise both the operative leg and the other leg as well.   Exercises include:   Quad Sets - Tighten up the muscle on the front of the thigh (Quad) and hold for 5-10 seconds.   Straight Leg Raises - With your knee straight (if you were given a brace, keep it on), lift the leg to 60 degrees, hold for 3 seconds, and slowly lower the leg.  Perform this exercise against resistance later as your leg gets stronger.  Leg Slides: Lying on your back, slowly slide your foot toward your buttocks, bending your knee up off the floor (only go as far as is comfortable). Then slowly slide your foot back down until your leg is flat on the floor again.  Angel Wings: Lying on your back spread your legs to the side as far apart as you can without causing discomfort.  Hamstring Strength:  Lying on your back, push your heel against the floor with your leg straight by tightening up the muscles of your buttocks.  Repeat, but this time bend your knee to a comfortable angle, and push your heel against the floor.  You may put a pillow under the heel to make it more comfortable if necessary.   A rehabilitation program following joint replacement surgery can speed recovery and prevent re-injury in the future due to weakened muscles. Contact your doctor or a physical therapist for more information on knee rehabilitation.    CONSTIPATION  Constipation is defined medically as fewer than three stools per week and severe constipation as less than one stool per week.  Even if you have a regular bowel pattern at home, your normal regimen is likely to be disrupted due to multiple reasons following surgery.   Combination of anesthesia, postoperative narcotics, change in appetite and fluid intake all can affect your bowels.   YOU MUST use at least one of the following options; they are listed in order of increasing strength to get the job done.  They are all available over the counter, and you may need to use some, POSSIBLY even all of these options:    Drink plenty of fluids (prune juice may be helpful) and high fiber foods Colace 100 mg by mouth twice a day  Senokot for constipation as directed and as needed Dulcolax (bisacodyl), take with full glass of water  Miralax (polyethylene glycol) once or twice a day as needed.  If you have tried all these things and are unable to have a bowel movement in the first 3-4 days after surgery call either your surgeon or your primary doctor.    If you experience loose stools or  diarrhea, hold the medications until you stool forms back up.  If your symptoms do not get better within 1 week or if they get worse, check with your doctor.  If you experience "the worst abdominal pain ever" or develop nausea or vomiting, please contact the office immediately for further recommendations for treatment.   ITCHING:  If you experience itching with your medications, try taking only a single pain pill, or even half a pain pill at a time.  You can also use Benadryl over the counter for itching or also to help with sleep.   TED HOSE STOCKINGS:  Use stockings on both legs until for at least 2 weeks or as directed by physician office. They may be removed at night for sleeping.  MEDICATIONS:  See your medication summary on the "After Visit Summary" that nursing will review with you.  You may have some home medications which will be placed on hold until you complete the course of blood thinner medication.  It is important for you to complete the blood thinner medication as prescribed.  PRECAUTIONS:  If you experience chest pain or shortness of breath - call 911 immediately for  transfer to the hospital emergency department.   If you develop a fever greater that 101 F, purulent drainage from wound, increased redness or drainage from wound, foul odor from the wound/dressing, or calf pain - CONTACT YOUR SURGEON.                                                   FOLLOW-UP APPOINTMENTS:  If you do not already have a post-op appointment, please call the office for an appointment to be seen by your surgeon.  Guidelines for how soon to be seen are listed in your "After Visit Summary", but are typically between 1-4 weeks after surgery.  OTHER INSTRUCTIONS:   Knee Replacement:  Do not place pillow under knee, focus on keeping the knee straight while resting. CPM instructions: 0-90 degrees, 2 hours in the morning, 2 hours in the afternoon, and 2 hours in the evening. Place foam block, curve side up under heel at all times except when in CPM or when walking.  DO NOT modify, tear, cut, or change the foam block in any way.  MAKE SURE YOU:  Understand these instructions.  Get help right away if you are not doing well or get worse.    Thank you for letting us be a part of your medical care team.  It is a privilege we respect greatly.  We hope these instructions will help you stay on track for a fast and full recovery!   Increase activity slowly as tolerated   Complete by: As directed       Follow-up Information    Melrose Nakayama, MD. Schedule an appointment as soon as possible for a visit in 2 weeks.   Specialty: Orthopedic Surgery Contact information: [place] [address] [phone]            Signed: Larwance Sachs Beckham Capistran 09/16/2019, 8:07 AM

## 2019-09-16 NOTE — Progress Notes (Signed)
Subjective: 1 Day Post-Op Procedure(s) (LRB): RIGHT TOTAL KNEE ARTHROPLASTY (Right)   Patient feels great and is ready to go home.  Activity level:  wbat Diet tolerance:  ok Voiding:  ok Patient reports pain as mild.    Objective: Vital signs in last 24 hours: Temp:  [97.6 F (36.4 C)-98.3 F (36.8 C)] 98 F (36.7 C) (09/30 0434) Pulse Rate:  [55-69] 63 (09/30 0434) Resp:  [12-18] 16 (09/30 0434) BP: (88-112)/(53-76) 92/53 (09/30 0434) SpO2:  [95 %-100 %] 95 % (09/30 0434) Weight:  [83 kg] 83 kg (09/29 1122)  Labs: No results for input(s): HGB in the last 72 hours. No results for input(s): WBC, RBC, HCT, PLT in the last 72 hours. No results for input(s): NA, K, CL, CO2, BUN, CREATININE, GLUCOSE, CALCIUM in the last 72 hours. No results for input(s): LABPT, INR in the last 72 hours.  Physical Exam:  Neurologically intact ABD soft Neurovascular intact Sensation intact distally Intact pulses distally Dorsiflexion/Plantar flexion intact Incision: dressing C/D/I and no drainage No cellulitis present Compartment soft  Assessment/Plan:  1 Day Post-Op Procedure(s) (LRB): RIGHT TOTAL KNEE ARTHROPLASTY (Right) Advance diet Up with therapy D/C IV fluids Discharge home with home health today after PT.  Continue on 81mg  ASA BID x 2 weeks post op for DVT prevention. Follow up in office 2 weeks post op.    Robin Small 09/16/2019, 8:03 AM

## 2019-09-16 NOTE — Progress Notes (Signed)
CSW met with the patient at bedside to discuss Home Health needs. Per patient she has an appointment at [facility]. CSW notified nurse. Nurse confirm plan with PA-Andrew Nida.  Patient has DME-RW and 3 in 1.  No other needs identified.

## 2019-09-16 NOTE — Plan of Care (Signed)

## 2019-09-25 ENCOUNTER — Other Ambulatory Visit: Payer: Self-pay

## 2019-09-25 DIAGNOSIS — Z20822 Contact with and (suspected) exposure to covid-19: Secondary | ICD-10-CM

## 2019-09-27 LAB — NOVEL CORONAVIRUS, NAA: SARS-CoV-2, NAA: NOT DETECTED

## 2019-11-09 ENCOUNTER — Other Ambulatory Visit: Payer: Self-pay | Admitting: Obstetrics and Gynecology

## 2019-11-09 DIAGNOSIS — Z803 Family history of malignant neoplasm of breast: Secondary | ICD-10-CM

## 2019-11-23 ENCOUNTER — Other Ambulatory Visit: Payer: Self-pay

## 2019-11-23 DIAGNOSIS — Z20822 Contact with and (suspected) exposure to covid-19: Secondary | ICD-10-CM

## 2019-11-25 LAB — NOVEL CORONAVIRUS, NAA: SARS-CoV-2, NAA: NOT DETECTED

## 2020-02-08 ENCOUNTER — Encounter: Payer: Self-pay | Admitting: Family Medicine

## 2020-03-14 ENCOUNTER — Encounter: Payer: Self-pay | Admitting: Family Medicine

## 2020-03-14 ENCOUNTER — Other Ambulatory Visit: Payer: Self-pay

## 2020-03-14 ENCOUNTER — Ambulatory Visit: Payer: 59 | Admitting: Family Medicine

## 2020-03-14 VITALS — BP 104/72 | HR 73 | Temp 98.0°F | Ht 61.0 in | Wt 182.0 lb

## 2020-03-14 DIAGNOSIS — E785 Hyperlipidemia, unspecified: Secondary | ICD-10-CM

## 2020-03-14 DIAGNOSIS — E6609 Other obesity due to excess calories: Secondary | ICD-10-CM

## 2020-03-14 DIAGNOSIS — R7303 Prediabetes: Secondary | ICD-10-CM

## 2020-03-14 DIAGNOSIS — J301 Allergic rhinitis due to pollen: Secondary | ICD-10-CM

## 2020-03-14 DIAGNOSIS — Z1159 Encounter for screening for other viral diseases: Secondary | ICD-10-CM

## 2020-03-14 DIAGNOSIS — Z6834 Body mass index (BMI) 34.0-34.9, adult: Secondary | ICD-10-CM

## 2020-03-14 DIAGNOSIS — M1990 Unspecified osteoarthritis, unspecified site: Secondary | ICD-10-CM

## 2020-03-14 DIAGNOSIS — M533 Sacrococcygeal disorders, not elsewhere classified: Secondary | ICD-10-CM

## 2020-03-14 DIAGNOSIS — Z114 Encounter for screening for human immunodeficiency virus [HIV]: Secondary | ICD-10-CM | POA: Diagnosis not present

## 2020-03-14 DIAGNOSIS — J4540 Moderate persistent asthma, uncomplicated: Secondary | ICD-10-CM

## 2020-03-14 MED ORDER — FLUTICASONE PROPIONATE 50 MCG/ACT NA SUSP: 2.0000 | Freq: Every day | NASAL | 6 refills | Status: DC

## 2020-03-14 MED ORDER — MONTELUKAST SODIUM 10 MG PO TABS: 10.0000 mg | ORAL_TABLET | Freq: Every day | ORAL | 3 refills | Status: DC

## 2020-03-14 NOTE — Progress Notes (Signed)
pt states overall she feels well onther than an ear ache in her R ear that strarted 2 days ago. Pt states she has questions with her arthritis in her knees. Pt also would like a handycap for filled out so she can get a handycap sticker. Pt also has a medication that is no longer cover by her insurance and would like to discuss a possible alterative.medication is Premarin Vaginal cream.104/72

## 2020-03-14 NOTE — Progress Notes (Signed)
Established Patient Office Visit  Subjective:  Patient ID: Robin Small, female    DOB: [birth year]  Age: 60 y.o. MRN: [id number]  CC:  Chief Complaint  Patient presents with  . Annual Exam    pt states overall she feels well onther than an ear ache in her R ear that strarted 2 days ago.    HPI Robin Small presents for   Osteoarthritis Patient had left knee replacement March 2020 and right knee replacement September 2020. She is concerned that she lost her job November 2020 She states that she is having SI joint pains on both side but primarily on her right side. She states that [facility] addressed her issues with the SI joint bilaterally and gave her steroid shots. She states that she has difficulty picking up her leg.  She is going back to PT and is trying to get better. She states that she is concerned about the osteoarthritis.  She is wondering should she apply for disability SSI.    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  Wt Readings from Last 3 Encounters:  03/14/20 182 lb (82.6 kg)  09/15/19 182 lb 15.7 oz (83 kg)  09/11/19 183 lb (83 kg)   Vaginal lichen sclerosis She goes to [facility] is prescribing premarin vaginal cream for her vaginal dryness She states that she uses clobetasol for her lichen sclerosis  She states that she uses the premarin every 2-3 weeks She denies dysuria or hematuria  Otalgia She states that in the last 48 hours she developed an ear ache She has seasonal allergies She wears her mask when outside  She states that she has congestion in her nose She has tried mucinex and sudafeds She takes claritin daily Asthma/CPAP/Smoking She has increased to a pack a week to 1/2 pack per day. She uses her inhaler a few times a week She uses it for shortness of breath, wheezing and when she feels congested She also uses her CPAP nightly and has to use it for sleep She has been using it for  6 years   Past Medical History:  Diagnosis Date  . Anxiety   . Depression   . Diverticulosis 03/2016   Mild, noted on colonoscopy  . High cholesterol   . History of kidney stones   . Lichen sclerosus    Anus  . Low blood pressure reading   . OA (osteoarthritis)   . OSA on CPAP   . Perianal cyst   . Personal history of colonic polyps 04/06/2011  . PONV (postoperative nausea and vomiting)    after c section, did well after knee replacement  . PPD positive    Age 71 >> reports taking medicine for one year  . Pre-diabetes    per pt report  . Seasonal allergies     Past Surgical History:  Procedure Laterality Date  . ARTHROSCOPIC KNEE Left   . CESAREAN SECTION    . COLONOSCOPY W/ POLYPECTOMY  04/11/2016  . ENDOMETRIAL ABLATION    . GANGLION CYST EXCISION Right   . HEMORROIDECTOMY    . lichen sclerosus lesion excision     Anus  . TOTAL KNEE ARTHROPLASTY Left 03/03/2019   Procedure: TOTAL KNEE ARTHROPLASTY;  Surgeon: Melrose Nakayama, MD;  Location: [facility];  Service: Orthopedics;  Laterality: Left;  . TOTAL KNEE ARTHROPLASTY Right 09/15/2019   Procedure: RIGHT TOTAL KNEE ARTHROPLASTY;  Surgeon: Melrose Nakayama, MD;  Location:  [facility];  Service: Orthopedics;  Laterality: Right;  . TUBAL LIGATION      Family History  Problem Relation Age of Onset  . Colon polyps Mother   . Diabetes Mother   . Hypertension Mother   . Heart disease Mother   . Hyperlipidemia Mother   . Alcohol abuse Brother   . Hepatitis Brother        hep c  . Breast cancer Maternal Grandmother        breast  . Lung cancer Father   . Colon cancer Neg Hx     Social History   Socioeconomic History  . Marital status: Married    Spouse name: Not on file  . Number of children: Not on file  . Years of education: Not on file  . Highest education level: Not on file  Occupational History  . Occupation: internet support  Tobacco Use  . Smoking status: Current Some Day Smoker    Packs/day: 0.50    Years:  42.00    Pack years: 21.00    Types: Cigarettes    Last attempt to quit: 07/31/2019    Years since quitting: 0.6  . Smokeless tobacco: Never Used  . Tobacco comment: 1 pack per week on and off  Substance and Sexual Activity  . Alcohol use: Yes    Alcohol/week: 0.0 standard drinks    Comment: rare  . Drug use: No  . Sexual activity: Yes    Partners: Male    Birth control/protection: Post-menopausal, Surgical  Other Topics Concern  . Not on file  Social History Narrative   Exercise-- daily for 1 hour   Social Determinants of Health   Financial Resource Strain:   . Difficulty of Paying Living Expenses:   Food Insecurity:   . Worried About Charity fundraiser in the Last Year:   . Arboriculturist in the Last Year:   Transportation Needs:   . Film/video editor (Medical):   Marland Kitchen Lack of Transportation (Non-Medical):   Physical Activity:   . Days of Exercise per Week:   . Minutes of Exercise per Session:   Stress:   . Feeling of Stress :   Social Connections:   . Frequency of Communication with Friends and Family:   . Frequency of Social Gatherings with Friends and Family:   . Attends Religious Services:   . Active Member of Clubs or Organizations:   . Attends Archivist Meetings:   Marland Kitchen Marital Status:   Intimate Partner Violence:   . Fear of Current or Ex-Partner:   . Emotionally Abused:   Marland Kitchen Physically Abused:   . Sexually Abused:     Outpatient Medications Prior to Visit  Medication Sig Dispense Refill  . acetaminophen (TYLENOL) 500 MG tablet Take 1,000 mg by mouth every 6 (six) hours as needed for moderate pain.    Marland Kitchen aspirin 81 MG chewable tablet Chew 1 tablet (81 mg total) by mouth 2 (two) times daily. 30 tablet 0  . Calcium Carbonate-Vitamin D (CALCIUM-D) 600-400 MG-UNIT TABS Take 2 tablets by mouth at bedtime.     . clobetasol cream (TEMOVATE) 9.92 % Apply 1 application topically 2 (two) times daily as needed (skin irritation).    Marland Kitchen  HYDROcodone-acetaminophen (NORCO/VICODIN) 5-325 MG tablet Take 1-2 tablets by mouth every 4 (four) hours as needed for moderate pain (pain score 4-6). 30 tablet 0  . loratadine (CLARITIN) 10 MG tablet Take 10 mg by mouth daily.     Marland Kitchen  Multiple Vitamins-Minerals (AIRBORNE PO) Take 2 tablets by mouth daily.    Marland Kitchen PROAIR HFA 108 (90 Base) MCG/ACT inhaler INHALE 2 PUFFS INTO THE LUNGS EVERY 6 HOURS AS NEEDED FOR WHEEZING OR SHORTNESS OF BREATH (Patient taking differently: Inhale 2 puffs into the lungs every 6 (six) hours as needed for wheezing or shortness of breath. ) 8.5 g 0  . tiZANidine (ZANAFLEX) 4 MG tablet Take 1 tablet (4 mg total) by mouth every 6 (six) hours as needed for muscle spasms. 40 tablet 1  . zolpidem (AMBIEN) 10 MG tablet Take 10 mg by mouth at bedtime.    Marland Kitchen zolpidem (AMBIEN) 5 MG tablet 1 at bedtime as needed for sleep 90 tablet 1  . PREMARIN vaginal cream FIEPPIRJ:1.8 Applicator Vaginal Twice a Week     No facility-administered medications prior to visit.    Allergies  Allergen Reactions  . Codeine Nausea Only    ROS Review of Systems Review of Systems  Constitutional: Negative for activity change, appetite change, chills and fever.  HENT: see hpi Respiratory: see hpi   Gastrointestinal: Negative for diarrhea, nausea and vomiting.  Genitourinary: Negative for difficulty urinating, dysuria, flank pain and hematuria.  Musculoskeletal: see hpi Neurological: Negative for dizziness, speech difficulty, light-headedness and numbness.  See HPI. All other review of systems negative.     Objective:    Physical Exam  BP 104/72   Pulse 73   Temp 98 F (36.7 C) (Temporal)   Ht 5' 1"  (1.549 m)   Wt 182 lb (82.6 kg)   SpO2 97%   BMI 34.39 kg/m  Wt Readings from Last 3 Encounters:  03/14/20 182 lb (82.6 kg)  09/15/19 182 lb 15.7 oz (83 kg)  09/11/19 183 lb (83 kg)   Physical Exam  Constitutional: Oriented to person, place, and time. Appears well-developed and  well-nourished.  HENT:  Head: Normocephalic and atraumatic.  Eyes: Conjunctivae and EOM are normal.  Cardiovascular: Normal rate, regular rhythm, normal heart sounds and intact distal pulses.  No murmur heard. Pulmonary/Chest: Effort normal and breath sounds normal. No stridor. No respiratory distress. Has no wheezes.  Neurological: Is alert and oriented to person, place, and time.  Skin: Skin is warm. Capillary refill takes less than 2 seconds.  Psychiatric: Has a normal mood and affect. Behavior is normal. Judgment and thought content normal.    Health Maintenance Due  Topic Date Due  . Hepatitis C Screening  Never done  . HIV Screening  Never done  . MAMMOGRAM  01/01/2014  . TETANUS/TDAP  12/15/2014  . PAP SMEAR-Modifier  05/02/2018    There are no preventive care reminders to display for this patient.  Lab Results  Component Value Date   TSH 1.25 08/24/2013   Lab Results  Component Value Date   WBC 8.5 09/11/2019   HGB 13.6 09/11/2019   HCT 43.1 09/11/2019   MCV 90.9 09/11/2019   PLT 302 09/11/2019   Lab Results  Component Value Date   NA 140 09/11/2019   K 4.2 09/11/2019   CO2 27 09/11/2019   GLUCOSE 110 (H) 09/11/2019   BUN 14 09/11/2019   CREATININE 0.57 09/11/2019   BILITOT 0.7 08/24/2013   ALKPHOS 73 08/24/2013   AST 23 08/24/2013   ALT 34 08/24/2013   PROT 7.4 08/24/2013   ALBUMIN 4.0 08/24/2013   CALCIUM 9.8 09/11/2019   ANIONGAP 10 09/11/2019   GFR 119.02 08/24/2013   Lab Results  Component Value Date   CHOL 122 08/24/2013  Lab Results  Component Value Date   HDL 36.30 (L) 08/24/2013   Lab Results  Component Value Date   LDLCALC 64 02/08/2012   Lab Results  Component Value Date   TRIG 214.0 (H) 08/24/2013   Lab Results  Component Value Date   CHOLHDL 3 08/24/2013   Lab Results  Component Value Date   HGBA1C 6.2 (H) 09/11/2019      Assessment & Plan:   Problem List Items Addressed This Visit    None    Visit Diagnoses     Screening for HIV (human immunodeficiency virus)    -  Primary   Relevant Orders   HIV antibody (with reflex)   Encounter for hepatitis C screening test for low risk patient       Relevant Orders   HCV Ab w Reflex to Quant PCR   Prediabetes    -  Discussed diabetes prevention    Relevant Orders   CMP14+EGFR   Hemoglobin A1c   Dyslipidemia    - Discussed medications that affect lipids Reminded patient to avoid grapefruits Reviewed last 3 lipids Discussed current meds: statin, aspirin Advised dietary fiber and fish oil and ways to keep HDL high CAD prevention and reviewed side effects of statins    Relevant Orders   CMP14+EGFR   Lipid panel   Chronic arthritis       Class 1 obesity due to excess calories with serious comorbidity and body mass index (BMI) of 34.0 to 34.9 in adult    -  Continue exercise and weight mgmt   SI (sacroiliac) pain       Seasonal allergic rhinitis due to pollen    - discussed symptomatic treatment   Moderate persistent extrinsic asthma without complication    -  Added singulair to current regimen   Relevant Medications   montelukast (SINGULAIR) 10 MG tablet      Meds ordered this encounter  Medications  . fluticasone (FLONASE) 50 MCG/ACT nasal spray    Sig: Place 2 sprays into both nostrils daily.    Dispense:  16 g    Refill:  6  . montelukast (SINGULAIR) 10 MG tablet    Sig: Take 1 tablet (10 mg total) by mouth at bedtime.    Dispense:  30 tablet    Refill:  3    Follow-up: No follow-ups on file.    Forrest Moron, MD

## 2020-03-14 NOTE — Patient Instructions (Addendum)
   If you have lab work done today you will be contacted with your lab results within the next 2 weeks.  If you have not heard from us then please contact us. The fastest way to get your results is to register for My Chart.   IF you received an x-ray today, you will receive an invoice from [facility]. Please contact  Radiology at [phone] with questions or concerns regarding your invoice.   IF you received labwork today, you will receive an invoice from [facility]. Please contact [facility] at [phone] with questions or concerns regarding your invoice.   Our billing staff will not be able to assist you with questions regarding bills from these companies.  You will be contacted with the lab results as soon as they are available. The fastest way to get your results is to activate your My Chart account. Instructions are located on the last page of this paperwork. If you have not heard from us regarding the results in 2 weeks, please contact this office.    Allergic Rhinitis, Adult Allergic rhinitis is an allergic reaction that affects the mucous membrane inside the nose. It causes sneezing, a runny or stuffy nose, and the feeling of mucus going down the back of the throat (postnasal drip). Allergic rhinitis can be mild to severe. There are two types of allergic rhinitis:  Seasonal. This type is also called hay fever. It happens only during certain seasons.  Perennial. This type can happen at any time of the year. What are the causes? This condition happens when the body's defense system (immune system) responds to certain harmless substances called allergens as though they were germs.  Seasonal allergic rhinitis is triggered by pollen, which can come from grasses, trees, and weeds. Perennial allergic rhinitis may be caused by:  House dust mites.  Pet dander.  Mold spores. What are the signs or symptoms? Symptoms of this condition include:  Sneezing.  Runny  or stuffy nose (nasal congestion).  Postnasal drip.  Itchy nose.  Tearing of the eyes.  Trouble sleeping.  Daytime sleepiness. How is this diagnosed? This condition may be diagnosed based on:  Your medical history.  A physical exam.  Tests to check for related conditions, such as: ? Asthma. ? Pink eye. ? Ear infection. ? Upper respiratory infection.  Tests to find out which allergens trigger your symptoms. These may include skin or blood tests. How is this treated? There is no cure for this condition, but treatment can help control symptoms. Treatment may include:  Taking medicines that block allergy symptoms, such as antihistamines. Medicine may be given as a shot, nasal spray, or pill.  Avoiding the allergen.  Desensitization. This treatment involves getting ongoing shots until your body becomes less sensitive to the allergen. This treatment may be done if other treatments do not help.  If taking medicine and avoiding the allergen does not work, new, stronger medicines may be prescribed. Follow these instructions at home:  Find out what you are allergic to. Common allergens include smoke, dust, and pollen.  Avoid the things you are allergic to. These are some things you can do to help avoid allergens: ? Replace carpet with wood, tile, or vinyl flooring. Carpet can trap dander and dust. ? Do not smoke. Do not allow smoking in your home. ? Change your heating and air conditioning filter at least once a month. ? During allergy season:  Keep windows closed as much as possible.  Plan outdoor activities when   pollen counts are lowest. This is usually during the evening hours.  When coming indoors, change clothing and shower before sitting on furniture or bedding.  Take over-the-counter and prescription medicines only as told by your health care provider.  Keep all follow-up visits as told by your health care provider. This is important. Contact a health care provider  if:  You have a fever.  You develop a persistent cough.  You make whistling sounds when you breathe (you wheeze).  Your symptoms interfere with your normal daily activities. Get help right away if:  You have shortness of breath. Summary  This condition can be managed by taking medicines as directed and avoiding allergens.  Contact your health care provider if you develop a persistent cough or fever.  During allergy season, keep windows closed as much as possible. This information is not intended to replace advice given to you by your health care provider. Make sure you discuss any questions you have with your health care provider. Document Revised: 11/15/2017 Document Reviewed: 01/10/2017 Elsevier Patient Education  2020 [employer].

## 2020-03-15 ENCOUNTER — Ambulatory Visit (INDEPENDENT_AMBULATORY_CARE_PROVIDER_SITE_OTHER): Payer: 59 | Admitting: Family Medicine

## 2020-03-15 DIAGNOSIS — Z1159 Encounter for screening for other viral diseases: Secondary | ICD-10-CM

## 2020-03-15 DIAGNOSIS — Z114 Encounter for screening for human immunodeficiency virus [HIV]: Secondary | ICD-10-CM

## 2020-03-15 DIAGNOSIS — E785 Hyperlipidemia, unspecified: Secondary | ICD-10-CM

## 2020-03-15 DIAGNOSIS — R7303 Prediabetes: Secondary | ICD-10-CM

## 2020-03-16 LAB — CMP14+EGFR
ALT: 18 IU/L (ref 0–32)
AST: 14 IU/L (ref 0–40)
Albumin/Globulin Ratio: 1.7 (ref 1.2–2.2)
Albumin: 4.5 g/dL (ref 3.8–4.9)
Alkaline Phosphatase: 85 IU/L (ref 39–117)
BUN/Creatinine Ratio: 15 (ref 9–23)
BUN: 9 mg/dL (ref 6–24)
Bilirubin Total: 0.2 mg/dL (ref 0.0–1.2)
CO2: 19 mmol/L — ABNORMAL LOW (ref 20–29)
Calcium: 9.6 mg/dL (ref 8.7–10.2)
Chloride: 105 mmol/L (ref 96–106)
Creatinine, Ser: 0.62 mg/dL (ref 0.57–1.00)
GFR calc Af Amer: 116 mL/min/{1.73_m2} (ref >59)
GFR calc non Af Amer: 100 mL/min/{1.73_m2} (ref >59)
Globulin, Total: 2.6 g/dL (ref 1.5–4.5)
Glucose: 112 mg/dL — ABNORMAL HIGH (ref 65–99)
Potassium: 4.3 mmol/L (ref 3.5–5.2)
Sodium: 140 mmol/L (ref 134–144)
Total Protein: 7.1 g/dL (ref 6.0–8.5)

## 2020-03-16 LAB — HEMOGLOBIN A1C
Est. average glucose Bld gHb Est-mCnc: 137 mg/dL
Hgb A1c MFr Bld: 6.4 % — ABNORMAL HIGH (ref 4.8–5.6)

## 2020-03-16 LAB — LIPID PANEL
Chol/HDL Ratio: 6.2 ratio — ABNORMAL HIGH (ref 0.0–4.4)
Cholesterol, Total: 265 mg/dL — ABNORMAL HIGH (ref 100–199)
HDL: 43 mg/dL (ref >39)
LDL Chol Calc (NIH): 170 mg/dL — ABNORMAL HIGH (ref 0–99)
Triglycerides: 274 mg/dL — ABNORMAL HIGH (ref 0–149)
VLDL Cholesterol Cal: 52 mg/dL — ABNORMAL HIGH (ref 5–40)

## 2020-03-16 LAB — HCV AB W REFLEX TO QUANT PCR: HCV Ab: <0.1 s/co ratio (ref 0.0–0.9)

## 2020-03-16 LAB — HCV INTERPRETATION

## 2020-03-16 LAB — HIV ANTIBODY (ROUTINE TESTING W REFLEX): HIV Screen 4th Generation wRfx: NONREACTIVE

## 2020-03-17 ENCOUNTER — Other Ambulatory Visit: Payer: Self-pay | Admitting: Family Medicine

## 2020-03-17 MED ORDER — ATORVASTATIN CALCIUM 20 MG PO TABS: 20.0000 mg | ORAL_TABLET | Freq: Every day | ORAL | 3 refills | Status: DC

## 2020-03-24 ENCOUNTER — Telehealth: Payer: Self-pay | Admitting: Family Medicine

## 2020-03-24 NOTE — Telephone Encounter (Signed)
Called patient to offer a TOC appt . Although she stated that she would see about going to another office

## 2020-05-23 ENCOUNTER — Telehealth: Payer: 59 | Admitting: Emergency Medicine

## 2020-05-23 DIAGNOSIS — J309 Allergic rhinitis, unspecified: Secondary | ICD-10-CM

## 2020-05-23 MED ORDER — SYSTANE PRESERVATIVE FREE 0.4-0.3 % OP SOLN: OPHTHALMIC | 0 refills | Status: DC

## 2020-05-23 MED ORDER — LEVOCETIRIZINE DIHYDROCHLORIDE 5 MG PO TABS: 5.0000 mg | ORAL_TABLET | Freq: Every evening | ORAL | 0 refills | Status: DC

## 2020-05-23 NOTE — Progress Notes (Signed)
E visit for Allergic Rhinitis We are sorry that you are not feeling well.  Here is how we plan to help!  Based on what you have shared with me it looks like you have Allergic Rhinitis.  Rhinitis is when a reaction occurs that causes nasal congestion, runny nose, sneezing, and itching.  Most types of rhinitis are caused by an inflammation and are associated with symptoms in the eyes ears or throat.  Regarding your eye swelling. This should improve in a day or two.  Try the drops that I've recommended below.  You can also try warm compresses on the eyes.   If you develop worsening symptoms such as thick discharge, redness of the eyeball, vision changes, or fever you should be seen in person by your doctor or an urgent care.  You wouldn't necessarily need to be seen by an eye doctor.   There are several types of rhinitis.  The most common are acute rhinitis, which is usually caused by a viral illness, allergic or seasonal rhinitis, and nonallergic or year-round rhinitis.  Nasal allergies occur certain times of the year.  Allergic rhinitis is caused when allergens in the air trigger the release of histamine in the body.  Histamine causes itching, swelling, and fluid to build up in the fragile linings of the nasal passages, sinuses and eyelids.  An itchy nose and clear discharge are common.  I recommend the following over the counter treatments: Xyzal 5 mg take 1 tablet daily  I also would recommend a nasal spray: Flonase 2 sprays into each nostril once daily  You may also benefit from eye drops such as: Systane 1-2 driops each eye twice daily as needed  HOME CARE:   You can use an over-the-counter saline nasal spray as needed  Avoid areas where there is heavy dust, mites, or molds  Stay indoors on windy days during the pollen season  Keep windows closed in home, at least in bedroom; use air conditioner.  Use high-efficiency house air filter  Keep windows closed in car, turn AC on  re-circulate  Avoid playing out with dog during pollen season  GET HELP RIGHT AWAY IF:   If your symptoms do not improve within 10 days  You become short of breath  You develop yellow or green discharge from your nose for over 3 days  You have coughing fits  MAKE SURE YOU:   Understand these instructions  Will watch your condition  Will get help right away if you are not doing well or get worse  Thank you for choosing an e-visit. Your e-visit answers were reviewed by a board certified advanced clinical practitioner to complete your personal care plan. Depending upon the condition, your plan could have included both over the counter or prescription medications. Please review your pharmacy choice. Be sure that the pharmacy you have chosen is open so that you can pick up your prescription now.  If there is a problem you may message your provider in [place] to have the prescription routed to another pharmacy. Your safety is important to [place]. If you have drug allergies check your prescription carefully.  For the next 24 hours, you can use MyChart to ask questions about today's visit, request a non-urgent call back, or ask for a work or school excuse from your e-visit provider. You will get an email in the next two days asking about your experience. I hope that your e-visit has been valuable and will speed your recovery.  Approximately 5 minutes was used in reviewing the patient's chart, questionnaire, prescribing medications, and documentation.

## 2020-05-23 NOTE — Addendum Note (Signed)
Addended by: Montine Circle B on: 05/23/2020 01:07 PM   Modules accepted: Orders

## 2020-05-25 ENCOUNTER — Ambulatory Visit: Payer: 59 | Admitting: Registered Nurse

## 2020-05-25 ENCOUNTER — Other Ambulatory Visit: Payer: Self-pay

## 2020-05-25 DIAGNOSIS — R21 Rash and other nonspecific skin eruption: Secondary | ICD-10-CM

## 2020-05-25 DIAGNOSIS — T7840XA Allergy, unspecified, initial encounter: Secondary | ICD-10-CM

## 2020-05-25 DIAGNOSIS — Z23 Encounter for immunization: Secondary | ICD-10-CM

## 2020-05-25 MED ORDER — PREDNISONE 10 MG (21) PO TBPK: ORAL_TABLET | ORAL | 0 refills | Status: DC

## 2020-05-25 MED ORDER — METHYLPREDNISOLONE ACETATE 40 MG/ML IJ SUSP
40.0000 mg | Freq: Once | INTRAMUSCULAR | Status: AC
  Administered 2020-05-25: 40 mg via INTRAMUSCULAR

## 2020-05-25 NOTE — Patient Instructions (Signed)
° ° ° °  If you have lab work done today you will be contacted with your lab results within the next 2 weeks.  If you have not heard from us then please contact us. The fastest way to get your results is to register for My Chart. ° ° °IF you received an x-ray today, you will receive an invoice from [facility]. Please contact [facility] at [phone] with questions or concerns regarding your invoice.  ° °IF you received labwork today, you will receive an invoice from [facility]. Please contact [facility] at [phone] with questions or concerns regarding your invoice.  ° °Our billing staff will not be able to assist you with questions regarding bills from these companies. ° °You will be contacted with the lab results as soon as they are available. The fastest way to get your results is to activate your My Chart account. Instructions are located on the last page of this paperwork. If you have not heard from us regarding the results in 2 weeks, please contact this office. °  ° ° ° °

## 2020-06-27 ENCOUNTER — Other Ambulatory Visit: Payer: Self-pay | Admitting: Registered Nurse

## 2020-06-27 MED ORDER — LEVOCETIRIZINE DIHYDROCHLORIDE 5 MG PO TABS: 5.0000 mg | ORAL_TABLET | Freq: Every evening | ORAL | 0 refills | Status: DC

## 2020-06-27 NOTE — Telephone Encounter (Signed)
levocetirizine (XYZAL) 5 MG tablet   And  Chantix (patient states she was prescribed this previously but unable to find on med list)    Patient is requesting refills.     [id number] [address], [address] AT [facility] [address]  [address] [place] [address]  Phone:  [phone]

## 2020-06-27 NOTE — Telephone Encounter (Signed)
Requested medication (s) are due for refill today - if to continue  Requested medication (s) are on the active medication list -yes  Future visit scheduled -yes  Last refill: 1 month  Notes to clinic: Patient request Rf of Rx written by outside provider  Requested Prescriptions  Pending Prescriptions Disp Refills   levocetirizine (XYZAL) 5 MG tablet 30 tablet 0    Sig: Take 1 tablet (5 mg total) by mouth every evening.      Ear, Nose, and Throat:  Antihistamines Passed - 06/27/2020 10:23 AM      Passed - Valid encounter within last 12 months    Recent Outpatient Visits           1 month ago Need for Tdap vaccination   [facility], Delfino Lovett, NP   3 months ago Dyslipidemia   Primary Care at [facility], Arlie Solomons, MD   3 months ago Screening for HIV (human immunodeficiency virus)   Primary Care at [facility], Zoe A, MD   2 years ago Acute bronchitis with bronchospasm   Primary Care at [facility], Arlie Solomons, MD   4 years ago Acute sinusitis, recurrence not specified, unspecified location   Primary Care at [facility], Geraldine, PA-C       Future Appointments             In 1 month Maximiano Coss, NP Primary Care at [place], [facility]                Requested Prescriptions  Pending Prescriptions Disp Refills   levocetirizine (XYZAL) 5 MG tablet 30 tablet 0    Sig: Take 1 tablet (5 mg total) by mouth every evening.      Ear, Nose, and Throat:  Antihistamines Passed - 06/27/2020 10:23 AM      Passed - Valid encounter within last 12 months    Recent Outpatient Visits           1 month ago Need for Tdap vaccination   [facility], Delfino Lovett, NP   3 months ago Dyslipidemia   Primary Care at [facility], Arlie Solomons, MD   3 months ago Screening for HIV (human immunodeficiency virus)   Primary Care at [facility], Zoe A, MD   2 years ago Acute bronchitis with bronchospasm   Primary Care at [facility], Arlie Solomons, MD   4 years  ago Acute sinusitis, recurrence not specified, unspecified location   Primary Care at [facility], Freida Busman, [place]       Future Appointments             In 1 month Maximiano Coss, NP Primary Care at [place], [facility]

## 2020-06-28 ENCOUNTER — Telehealth: Payer: Self-pay | Admitting: Registered Nurse

## 2020-06-28 NOTE — Telephone Encounter (Signed)
Prescription for Chantix requested , does she need an appointment in order to fill. Please advice

## 2020-06-28 NOTE — Telephone Encounter (Signed)
Pt is requesting a prescription for Chantix smoking cesssation  .   Please advise

## 2020-06-30 ENCOUNTER — Other Ambulatory Visit: Payer: Self-pay | Admitting: Registered Nurse

## 2020-06-30 DIAGNOSIS — Z716 Tobacco abuse counseling: Secondary | ICD-10-CM

## 2020-06-30 MED ORDER — VARENICLINE TARTRATE 1 MG PO TABS: 1.0000 mg | ORAL_TABLET | Freq: Two times a day (BID) | ORAL | 1 refills | Status: DC

## 2020-06-30 MED ORDER — CHANTIX STARTING MONTH PAK 0.5 MG X 11 & 1 MG X 42 PO TABS: ORAL_TABLET | ORAL | 0 refills | Status: DC

## 2020-06-30 NOTE — Telephone Encounter (Signed)
Sent both a starting pack and continuing pack.  She needs to follow package instructions for starting pack to titrate up dose.   If she is not able to quit within 2 months she should come in for a visit to discuss additional strategies.  Thank you  [name], NP

## 2020-07-01 ENCOUNTER — Telehealth: Payer: Self-pay

## 2020-07-01 NOTE — Telephone Encounter (Signed)
Informed patient per providers new orders for chantix and recommendations, patient verbalizes understanding.

## 2020-07-28 ENCOUNTER — Other Ambulatory Visit: Payer: Self-pay | Admitting: Registered Nurse

## 2020-08-24 ENCOUNTER — Encounter: Payer: 59 | Admitting: Registered Nurse

## 2020-08-28 ENCOUNTER — Other Ambulatory Visit: Payer: Self-pay | Admitting: Registered Nurse

## 2020-08-28 DIAGNOSIS — J301 Allergic rhinitis due to pollen: Secondary | ICD-10-CM

## 2020-09-06 ENCOUNTER — Encounter: Payer: Self-pay | Admitting: Registered Nurse

## 2020-09-06 NOTE — Progress Notes (Signed)
Established Patient Office Visit  Subjective:  Patient ID: Robin Small, female    DOB: 11-Jun-1961  Age: 59 y.o. MRN: [id number]  CC:  Chief Complaint  Patient presents with  . Facial Swelling    Pt stated that she noticed that her eye was swelling x2 days ago. She was out working in the yard  and broke out with a rash on her Rt arm and  hand     HPI DARRA ROSA presents for R eye swelling.  Rash on arm and hand as well Was out doing yard work, concerned for exposure to plant contact dermatitis Has happened before Rash is vesicular No involvement in orbit, just around. Denies change to vision and EOMs, eye pain, drainage.  Otherwise no concerns, feeling well overall   Past Medical History:  Diagnosis Date  . Anxiety   . Depression   . Diverticulosis 03/2016   Mild, noted on colonoscopy  . High cholesterol   . History of kidney stones   . Lichen sclerosus    Anus  . Low blood pressure reading   . OA (osteoarthritis)   . OSA on CPAP   . Perianal cyst   . Personal history of colonic polyps 04/06/2011  . PONV (postoperative nausea and vomiting)    after c section, did well after knee replacement  . PPD positive    Age 46 >> reports taking medicine for one year  . Pre-diabetes    per pt report  . Seasonal allergies     Past Surgical History:  Procedure Laterality Date  . ARTHROSCOPIC KNEE Left   . CESAREAN SECTION    . COLONOSCOPY W/ POLYPECTOMY  04/11/2016  . ENDOMETRIAL ABLATION    . GANGLION CYST EXCISION Right   . HEMORROIDECTOMY    . lichen sclerosus lesion excision     Anus  . TOTAL KNEE ARTHROPLASTY Left 03/03/2019   Procedure: TOTAL KNEE ARTHROPLASTY;  Surgeon: Melrose Nakayama, MD;  Location: [facility];  Service: Orthopedics;  Laterality: Left;  . TOTAL KNEE ARTHROPLASTY Right 09/15/2019   Procedure: RIGHT TOTAL KNEE ARTHROPLASTY;  Surgeon: Melrose Nakayama, MD;  Location: [facility];  Service: Orthopedics;  Laterality: Right;  . TUBAL LIGATION       Family History  Problem Relation Age of Onset  . Colon polyps Mother   . Diabetes Mother   . Hypertension Mother   . Heart disease Mother   . Hyperlipidemia Mother   . Alcohol abuse Brother   . Hepatitis Brother        hep c  . Breast cancer Maternal Grandmother        breast  . Lung cancer Father   . Colon cancer Neg Hx     Social History   Socioeconomic History  . Marital status: Married    Spouse name: Not on file  . Number of children: Not on file  . Years of education: Not on file  . Highest education level: Not on file  Occupational History  . Occupation: internet support  Tobacco Use  . Smoking status: Current Some Day Smoker    Packs/day: 0.50    Years: 42.00    Pack years: 21.00    Types: Cigarettes    Last attempt to quit: 07/31/2019    Years since quitting: 1.1  . Smokeless tobacco: Never Used  . Tobacco comment: 1 pack per week on and off  Vaping Use  . Vaping Use: Never used  Substance and Sexual Activity  .  Alcohol use: Yes    Alcohol/week: 0.0 standard drinks    Comment: rare  . Drug use: No  . Sexual activity: Yes    Partners: Male    Birth control/protection: Post-menopausal, Surgical  Other Topics Concern  . Not on file  Social History Narrative   Exercise-- daily for 1 hour   Social Determinants of Health   Financial Resource Strain:   . Difficulty of Paying Living Expenses: Not on file  Food Insecurity:   . Worried About Charity fundraiser in the Last Year: Not on file  . Ran Out of Food in the Last Year: Not on file  Transportation Needs:   . Lack of Transportation (Medical): Not on file  . Lack of Transportation (Non-Medical): Not on file  Physical Activity:   . Days of Exercise per Week: Not on file  . Minutes of Exercise per Session: Not on file  Stress:   . Feeling of Stress : Not on file  Social Connections:   . Frequency of Communication with Friends and Family: Not on file  . Frequency of Social Gatherings with  Friends and Family: Not on file  . Attends Religious Services: Not on file  . Active Member of Clubs or Organizations: Not on file  . Attends Archivist Meetings: Not on file  . Marital Status: Not on file  Intimate Partner Violence:   . Fear of Current or Ex-Partner: Not on file  . Emotionally Abused: Not on file  . Physically Abused: Not on file  . Sexually Abused: Not on file    Outpatient Medications Prior to Visit  Medication Sig Dispense Refill  . acetaminophen (TYLENOL) 500 MG tablet Take 1,000 mg by mouth every 6 (six) hours as needed for moderate pain.    Marland Kitchen aspirin 81 MG chewable tablet Chew 1 tablet (81 mg total) by mouth 2 (two) times daily. 30 tablet 0  . atorvastatin (LIPITOR) 20 MG tablet Take 1 tablet (20 mg total) by mouth daily. 90 tablet 3  . Calcium Carbonate-Vitamin D (CALCIUM-D) 600-400 MG-UNIT TABS Take 2 tablets by mouth at bedtime.     . clobetasol cream (TEMOVATE) 3.54 % Apply 1 application topically 2 (two) times daily as needed (skin irritation).    . fluticasone (FLONASE) 50 MCG/ACT nasal spray Place 2 sprays into both nostrils daily. 16 g 6  . HYDROcodone-acetaminophen (NORCO/VICODIN) 5-325 MG tablet Take 1-2 tablets by mouth every 4 (four) hours as needed for moderate pain (pain score 4-6). 30 tablet 0  . loratadine (CLARITIN) 10 MG tablet Take 10 mg by mouth daily.     . montelukast (SINGULAIR) 10 MG tablet Take 1 tablet (10 mg total) by mouth at bedtime. 30 tablet 3  . Multiple Vitamins-Minerals (AIRBORNE PO) Take 2 tablets by mouth daily.    Vladimir Faster Glyc-Propyl Glyc PF (SYSTANE PRESERVATIVE FREE) 0.4-0.3 % SOLN Instill 1-2 drops in each eye as needed. 28 each 0  . PREMARIN vaginal cream TGYBWLSL:3.7 Applicator Vaginal Twice a Week    . PROAIR HFA 108 (90 Base) MCG/ACT inhaler INHALE 2 PUFFS INTO THE LUNGS EVERY 6 HOURS AS NEEDED FOR WHEEZING OR SHORTNESS OF BREATH (Patient taking differently: Inhale 2 puffs into the lungs every 6 (six) hours as  needed for wheezing or shortness of breath. ) 8.5 g 0  . tiZANidine (ZANAFLEX) 4 MG tablet Take 1 tablet (4 mg total) by mouth every 6 (six) hours as needed for muscle spasms. 40 tablet 1  .  zolpidem (AMBIEN) 10 MG tablet Take 10 mg by mouth at bedtime.    Marland Kitchen zolpidem (AMBIEN) 5 MG tablet 1 at bedtime as needed for sleep 90 tablet 1  . levocetirizine (XYZAL) 5 MG tablet Take 1 tablet (5 mg total) by mouth every evening. 30 tablet 0   No facility-administered medications prior to visit.    Allergies  Allergen Reactions  . Codeine Nausea Only    ROS Review of Systems Per hpi     Objective:    Physical Exam Vitals and nursing note reviewed.  Constitutional:      General: She is not in acute distress.    Appearance: Normal appearance. She is normal weight. She is not ill-appearing, toxic-appearing or diaphoretic.  Cardiovascular:     Rate and Rhythm: Normal rate and regular rhythm.     Heart sounds: Normal heart sounds. No murmur heard.  No friction rub. No gallop.   Pulmonary:     Effort: Pulmonary effort is normal. No respiratory distress.     Breath sounds: Normal breath sounds. No stridor. No wheezing, rhonchi or rales.  Chest:     Chest wall: No tenderness.  Skin:    General: Skin is warm and dry.     Findings: Rash (plant contact dermatitis on R arm and hand, R eye) present.  Neurological:     General: No focal deficit present.     Mental Status: She is alert and oriented to person, place, and time. Mental status is at baseline.  Psychiatric:        Mood and Affect: Mood normal.        Behavior: Behavior normal.        Thought Content: Thought content normal.        Judgment: Judgment normal.     There were no vitals taken for this visit. Wt Readings from Last 3 Encounters:  03/14/20 182 lb (82.6 kg)  09/15/19 182 lb 15.7 oz (83 kg)  09/11/19 183 lb (83 kg)     Health Maintenance Due  Topic Date Due  . MAMMOGRAM  01/01/2014  . PAP SMEAR-Modifier   05/02/2018  . INFLUENZA VACCINE  07/17/2020    There are no preventive care reminders to display for this patient.  Lab Results  Component Value Date   TSH 1.25 08/24/2013   Lab Results  Component Value Date   WBC 8.5 09/11/2019   HGB 13.6 09/11/2019   HCT 43.1 09/11/2019   MCV 90.9 09/11/2019   PLT 302 09/11/2019   Lab Results  Component Value Date   NA 140 03/15/2020   K 4.3 03/15/2020   CO2 19 (L) 03/15/2020   GLUCOSE 112 (H) 03/15/2020   BUN 9 03/15/2020   CREATININE 0.62 03/15/2020   BILITOT 0.2 03/15/2020   ALKPHOS 85 03/15/2020   AST 14 03/15/2020   ALT 18 03/15/2020   PROT 7.1 03/15/2020   ALBUMIN 4.5 03/15/2020   CALCIUM 9.6 03/15/2020   ANIONGAP 10 09/11/2019   GFR 119.02 08/24/2013   Lab Results  Component Value Date   CHOL 265 (H) 03/15/2020   Lab Results  Component Value Date   HDL 43 03/15/2020   Lab Results  Component Value Date   LDLCALC 170 (H) 03/15/2020   Lab Results  Component Value Date   TRIG 274 (H) 03/15/2020   Lab Results  Component Value Date   CHOLHDL 6.2 (H) 03/15/2020   Lab Results  Component Value Date   HGBA1C 6.4 (H) 03/15/2020  Assessment & Plan:   Problem List Items Addressed This Visit    None    Visit Diagnoses    Need for Tdap vaccination    -  Primary   Relevant Orders   Tdap vaccine greater than or equal to 7yo IM (Completed)   Allergic reaction, initial encounter       Relevant Medications   predniSONE (STERAPRED UNI-PAK 21 TAB) 10 MG (21) TBPK tablet      Meds ordered this encounter  Medications  . predniSONE (STERAPRED UNI-PAK 21 TAB) 10 MG (21) TBPK tablet    Sig: Take per package instructions. Do not skip doses. Finish entire supply.    Dispense:  1 each    Refill:  0    Order Specific Question:   Supervising Provider    Answer:   Carlota Raspberry, JEFFREY R [[id number]]  . methylPREDNISolone acetate (DEPO-MEDROL) injection 40 mg    Follow-up: No follow-ups on file.   PLAN  Depo medrol  injection today  Start sterapred taper tomorrow  Can use OTC on arm and hand - not on face  Return if worsening or failing to improve.   Patient encouraged to call clinic with any questions, comments, or concerns.  Maximiano Coss, NP

## 2020-09-28 ENCOUNTER — Encounter: Payer: 59 | Admitting: Registered Nurse

## 2020-10-03 ENCOUNTER — Other Ambulatory Visit: Payer: Self-pay | Admitting: Registered Nurse

## 2020-10-03 ENCOUNTER — Other Ambulatory Visit: Payer: Self-pay

## 2020-10-03 ENCOUNTER — Ambulatory Visit: Payer: 59 | Admitting: Registered Nurse

## 2020-10-03 ENCOUNTER — Encounter: Payer: Self-pay | Admitting: Registered Nurse

## 2020-10-03 VITALS — BP 111/74 | HR 70 | Temp 98.0°F | Resp 18 | Ht 61.0 in | Wt 188.6 lb

## 2020-10-03 DIAGNOSIS — Z716 Tobacco abuse counseling: Secondary | ICD-10-CM

## 2020-10-03 DIAGNOSIS — J301 Allergic rhinitis due to pollen: Secondary | ICD-10-CM

## 2020-10-03 DIAGNOSIS — H538 Other visual disturbances: Secondary | ICD-10-CM

## 2020-10-03 DIAGNOSIS — J4540 Moderate persistent asthma, uncomplicated: Secondary | ICD-10-CM | POA: Diagnosis not present

## 2020-10-03 DIAGNOSIS — Z23 Encounter for immunization: Secondary | ICD-10-CM

## 2020-10-03 MED ORDER — CHANTIX STARTING MONTH PAK 0.5 MG X 11 & 1 MG X 42 PO TABS: ORAL_TABLET | ORAL | 0 refills | Status: DC

## 2020-10-03 MED ORDER — FLUTICASONE PROPIONATE 50 MCG/ACT NA SUSP: 2.0000 | Freq: Every day | NASAL | 6 refills | Status: DC

## 2020-10-03 MED ORDER — LEVOCETIRIZINE DIHYDROCHLORIDE 5 MG PO TABS: ORAL_TABLET | ORAL | 3 refills | Status: DC

## 2020-10-03 MED ORDER — MONTELUKAST SODIUM 10 MG PO TABS: 10.0000 mg | ORAL_TABLET | Freq: Every day | ORAL | 3 refills | Status: DC

## 2020-10-03 MED ORDER — ALBUTEROL SULFATE HFA 108 (90 BASE) MCG/ACT IN AERS: INHALATION_SPRAY | RESPIRATORY_TRACT | 0 refills | Status: DC

## 2020-10-03 NOTE — Progress Notes (Signed)
Acute Office Visit  Subjective:    Patient ID: Robin Small, female    DOB: 1961/01/03, 59 y.o.   MRN: [id number]  Chief Complaint  Patient presents with   Blurred Vision    Patient states that 2 times in the past 2 months she has had random blurred vision making it hard to focus. Patient would like to discuss medications    HPI Patient is in today for blurred vision 2 instances First was in late August. 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 No falls. No pain, no palpitations, denies chest pain, shob, doe, other neuro symptoms. She noted that this lasted around 30s max Second instance was last week. Again happened during delivery, this time when she sat back down in her car. Same description as previously  Of note, no major cv medical history beyond some moderately elevated cholesterol.  Has been exercising and trying to control diet more lately, as she is done rehabbing from her second TKA. Unfortunately gained the weight she lost prior to TKA during rehab.  Only other change of note since that time has been initiation of atorvastatin 20mg  PO qd for hyperlipidemia. Has been on statin in the past without issue.   Past Medical History:  Diagnosis Date   Anxiety    Depression    Diverticulosis 03/2016   Mild, noted on colonoscopy   High cholesterol    History of kidney stones    Lichen sclerosus    Anus   Low blood pressure reading    OA (osteoarthritis)    OSA on CPAP    Perianal cyst    Personal history of colonic polyps 04/06/2011   PONV (postoperative nausea and vomiting)    after c section, did well after knee replacement   PPD positive    Age 46 >> reports taking medicine for one year   Pre-diabetes    per pt report   Seasonal allergies     Past Surgical History:  Procedure Laterality Date     ARTHROSCOPIC KNEE Left    CESAREAN SECTION     COLONOSCOPY W/ POLYPECTOMY  04/11/2016   ENDOMETRIAL ABLATION     GANGLION CYST EXCISION Right    HEMORROIDECTOMY     lichen sclerosus lesion excision     Anus   TOTAL KNEE ARTHROPLASTY Left 03/03/2019   Procedure: TOTAL KNEE ARTHROPLASTY;  Surgeon: Melrose Nakayama, MD;  Location: [facility];  Service: Orthopedics;  Laterality: Left;   TOTAL KNEE ARTHROPLASTY Right 09/15/2019   Procedure: RIGHT TOTAL KNEE ARTHROPLASTY;  Surgeon: Melrose Nakayama, MD;  Location: [facility];  Service: Orthopedics;  Laterality: Right;   TUBAL LIGATION      Family History  Problem Relation Age of Onset   Colon polyps Mother    Diabetes Mother    Hypertension Mother    Heart disease Mother    Hyperlipidemia Mother    Alcohol abuse Brother    Hepatitis Brother        hep c   Breast cancer Maternal Grandmother        breast   Lung cancer Father    Colon cancer Neg Hx     Social History   Socioeconomic History   Marital status: Married    Spouse name: Not on file   Number of children: Not on file  Years of education: Not on file   Highest education level: Not on file  Occupational History   Occupation: internet support  Tobacco Use   Smoking status: Current Some Day Smoker    Packs/day: 0.50    Years: 42.00    Pack years: 21.00    Types: Cigarettes    Last attempt to quit: 07/31/2019    Years since quitting: 1.1   Smokeless tobacco: Never Used   Tobacco comment: 1 pack per week on and off  Vaping Use   Vaping Use: Never used  Substance and Sexual Activity   Alcohol use: Yes    Alcohol/week: 0.0 standard drinks    Comment: rare   Drug use: No   Sexual activity: Yes    Partners: Male    Birth control/protection: Post-menopausal, Surgical  Other Topics Concern   Not on file  Social History Narrative   Exercise-- daily for 1 hour   Social Determinants of Health   Financial Resource Strain:    Difficulty  of Paying Living Expenses: Not on file  Food Insecurity:    Worried About Charity fundraiser in the Last Year: Not on file   [employer] of Food in the Last Year: Not on file  Transportation Needs:    Lack of Transportation (Medical): Not on file   Lack of Transportation (Non-Medical): Not on file  Physical Activity:    Days of Exercise per Week: Not on file   Minutes of Exercise per Session: Not on file  Stress:    Feeling of Stress : Not on file  Social Connections:    Frequency of Communication with Friends and Family: Not on file   Frequency of Social Gatherings with Friends and Family: Not on file   Attends Religious Services: Not on file   Active Member of Clubs or Organizations: Not on file   Attends Archivist Meetings: Not on file   Marital Status: Not on file  Intimate Partner Violence:    Fear of Current or Ex-Partner: Not on file   Emotionally Abused: Not on file   Physically Abused: Not on file   Sexually Abused: Not on file    Outpatient Medications Prior to Visit  Medication Sig Dispense Refill   acetaminophen (TYLENOL) 500 MG tablet Take 1,000 mg by mouth every 6 (six) hours as needed for moderate pain.     aspirin 81 MG chewable tablet Chew 1 tablet (81 mg total) by mouth 2 (two) times daily. 30 tablet 0   atorvastatin (LIPITOR) 20 MG tablet Take 1 tablet (20 mg total) by mouth daily. 90 tablet 3   Calcium Carbonate-Vitamin D (CALCIUM-D) 600-400 MG-UNIT TABS Take 2 tablets by mouth at bedtime.      clobetasol cream (TEMOVATE) 1.28 % Apply 1 application topically 2 (two) times daily as needed (skin irritation).     HYDROcodone-acetaminophen (NORCO/VICODIN) 5-325 MG tablet Take 1-2 tablets by mouth every 4 (four) hours as needed for moderate pain (pain score 4-6). 30 tablet 0   Multiple Vitamins-Minerals (AIRBORNE PO) Take 2 tablets by mouth daily.     Polyethyl Glyc-Propyl Glyc PF (SYSTANE PRESERVATIVE FREE) 0.4-0.3 % SOLN Instill 1-2  drops in each eye as needed. 28 each 0   varenicline (CHANTIX) 1 MG tablet Take 1 tablet (1 mg total) by mouth 2 (two) times daily. 60 tablet 1   zolpidem (AMBIEN) 10 MG tablet Take 10 mg by mouth at bedtime.     fluticasone (FLONASE) 50 MCG/ACT nasal  spray Place 2 sprays into both nostrils daily. 16 g 6   levocetirizine (XYZAL) 5 MG tablet TAKE 1 TABLET(5 MG) BY MOUTH EVERY EVENING 30 tablet 0   loratadine (CLARITIN) 10 MG tablet Take 10 mg by mouth daily.      montelukast (SINGULAIR) 10 MG tablet Take 1 tablet (10 mg total) by mouth at bedtime. 30 tablet 3   PROAIR HFA 108 (90 Base) MCG/ACT inhaler INHALE 2 PUFFS INTO THE LUNGS EVERY 6 HOURS AS NEEDED FOR WHEEZING OR SHORTNESS OF BREATH (Patient taking differently: Inhale 2 puffs into the lungs every 6 (six) hours as needed for wheezing or shortness of breath. ) 8.5 g 0   varenicline (CHANTIX STARTING MONTH PAK) 0.5 MG X 11 & 1 MG X 42 tablet Take one 0.5 mg tablet by mouth once daily for 3 days, then increase to one 0.5 mg tablet twice daily for 4 days, then increase to one 1 mg tablet twice daily. 53 tablet 0   predniSONE (STERAPRED UNI-PAK 21 TAB) 10 MG (21) TBPK tablet Take per package instructions. Do not skip doses. Finish entire supply. (Patient not taking: Reported on 10/03/2020) 1 each 0   PREMARIN vaginal cream LPFXTKWI:0.9 Applicator Vaginal Twice a Week (Patient not taking: Reported on 10/03/2020)     zolpidem (AMBIEN) 5 MG tablet 1 at bedtime as needed for sleep (Patient not taking: Reported on 10/03/2020) 90 tablet 1   No facility-administered medications prior to visit.    Allergies  Allergen Reactions   Codeine Nausea Only    Review of Systems Per hpi      Objective:    Physical Exam Vitals and nursing note reviewed.  Constitutional:      Appearance: Normal appearance.  Cardiovascular:     Rate and Rhythm: Normal rate and regular rhythm.  Pulmonary:     Effort: Pulmonary effort is normal. No respiratory  distress.  Skin:    General: Skin is warm and dry.  Neurological:     General: No focal deficit present.     Mental Status: She is alert and oriented to person, place, and time. Mental status is at baseline.  Psychiatric:        Mood and Affect: Mood normal.        Behavior: Behavior normal.        Thought Content: Thought content normal.        Judgment: Judgment normal.     BP 111/74    Pulse 70    Temp 98 F (36.7 C) (Temporal)    Resp 18    Ht 5\' 1"  (1.549 m)    Wt 188 lb 9.6 oz (85.5 kg)    SpO2 97%    BMI 35.64 kg/m  Wt Readings from Last 3 Encounters:  10/03/20 188 lb 9.6 oz (85.5 kg)  03/14/20 182 lb (82.6 kg)  09/15/19 182 lb 15.7 oz (83 kg)    There are no preventive care reminders to display for this patient.  There are no preventive care reminders to display for this patient.   Lab Results  Component Value Date   TSH 1.25 08/24/2013   Lab Results  Component Value Date   WBC 8.5 09/11/2019   HGB 13.6 09/11/2019   HCT 43.1 09/11/2019   MCV 90.9 09/11/2019   PLT 302 09/11/2019   Lab Results  Component Value Date   NA 140 03/15/2020   K 4.3 03/15/2020   CO2 19 (L) 03/15/2020   GLUCOSE 112 (H) 03/15/2020  BUN 9 03/15/2020   CREATININE 0.62 03/15/2020   BILITOT 0.2 03/15/2020   ALKPHOS 85 03/15/2020   AST 14 03/15/2020   ALT 18 03/15/2020   PROT 7.1 03/15/2020   ALBUMIN 4.5 03/15/2020   CALCIUM 9.6 03/15/2020   ANIONGAP 10 09/11/2019   GFR 119.02 08/24/2013   Lab Results  Component Value Date   CHOL 265 (H) 03/15/2020   Lab Results  Component Value Date   HDL 43 03/15/2020   Lab Results  Component Value Date   LDLCALC 170 (H) 03/15/2020   Lab Results  Component Value Date   TRIG 274 (H) 03/15/2020   Lab Results  Component Value Date   CHOLHDL 6.2 (H) 03/15/2020   Lab Results  Component Value Date   HGBA1C 6.4 (H) 03/15/2020       Assessment & Plan:   Problem List Items Addressed This Visit    None    Visit Diagnoses     Flu vaccine need    -  Primary   Relevant Orders   Flu Vaccine QUAD 6+ mos PF IM (Fluarix Quad PF) (Completed)   Blurred vision       Relevant Orders   CBC With Differential   Comprehensive metabolic panel   Lipid panel   TSH   Hemoglobin A1c   CK   Seasonal allergic rhinitis due to pollen       Relevant Medications   fluticasone (FLONASE) 50 MCG/ACT nasal spray   levocetirizine (XYZAL) 5 MG tablet   montelukast (SINGULAIR) 10 MG tablet   Encounter for smoking cessation counseling       Relevant Medications   varenicline (CHANTIX STARTING MONTH PAK) 0.5 MG X 11 & 1 MG X 42 tablet   Moderate persistent extrinsic asthma without complication       Relevant Medications   montelukast (SINGULAIR) 10 MG tablet   albuterol (PROAIR HFA) 108 (90 Base) MCG/ACT inhaler       Meds ordered this encounter  Medications   fluticasone (FLONASE) 50 MCG/ACT nasal spray    Sig: Place 2 sprays into both nostrils daily.    Dispense:  16 g    Refill:  6    Order Specific Question:   Supervising Provider    Answer:   Carlota Raspberry, JEFFREY R [[id number]]   levocetirizine (XYZAL) 5 MG tablet    Sig: TAKE 1 TABLET(5 MG) BY MOUTH EVERY EVENING    Dispense:  90 tablet    Refill:  3    Order Specific Question:   Supervising Provider    Answer:   Carlota Raspberry, JEFFREY R [[id number]]   montelukast (SINGULAIR) 10 MG tablet    Sig: Take 1 tablet (10 mg total) by mouth at bedtime.    Dispense:  30 tablet    Refill:  3    Order Specific Question:   Supervising Provider    Answer:   Carlota Raspberry, JEFFREY R [[id number]]   albuterol (PROAIR HFA) 108 (90 Base) MCG/ACT inhaler    Sig: INHALE 2 PUFFS INTO THE LUNGS EVERY 6 HOURS AS NEEDED FOR WHEEZING OR SHORTNESS OF BREATH    Dispense:  8.5 g    Refill:  0    Order Specific Question:   Supervising Provider    Answer:   Carlota Raspberry, JEFFREY R [[id number]]   varenicline (CHANTIX STARTING MONTH PAK) 0.5 MG X 11 & 1 MG X 42 tablet    Sig: Take one 0.5 mg tablet by mouth once daily for 3 days, then  increase to one 0.5 mg tablet twice daily for 4 days, then increase to one 1 mg tablet twice daily.    Dispense:  53 tablet    Refill:  0    Order Specific Question:   Supervising Provider    Answer:   Carlota Raspberry, JEFFREY R [[id number]]   PLAN  Will restart chantix per pt request  Will restart montelukast per pt request.   Will draw labs to investigate etiology - though my stronger suspicions are for hypoglycemia or hypotension secondary to hypovolemia - encouraged frequent small snacks and adequate fluid intake  Patient encouraged to call clinic with any questions, comments, or concerns.  I spent 31 minutes with this patient, more than 50% of which was spent counseling and/or educating.   Maximiano Coss, NP

## 2020-10-03 NOTE — Patient Instructions (Signed)
° ° ° °  If you have lab work done today you will be contacted with your lab results within the next 2 weeks.  If you have not heard from us then please contact us. The fastest way to get your results is to register for My Chart. ° ° °IF you received an x-ray today, you will receive an invoice from [facility]. Please contact [facility] at [phone] with questions or concerns regarding your invoice.  ° °IF you received labwork today, you will receive an invoice from [facility]. Please contact [facility] at [phone] with questions or concerns regarding your invoice.  ° °Our billing staff will not be able to assist you with questions regarding bills from these companies. ° °You will be contacted with the lab results as soon as they are available. The fastest way to get your results is to activate your My Chart account. Instructions are located on the last page of this paperwork. If you have not heard from us regarding the results in 2 weeks, please contact this office. °  ° ° ° °

## 2020-10-04 LAB — COMPREHENSIVE METABOLIC PANEL
ALT: 22 IU/L (ref 0–32)
AST: 16 IU/L (ref 0–40)
Albumin/Globulin Ratio: 1.8 (ref 1.2–2.2)
Albumin: 4.5 g/dL (ref 3.8–4.9)
Alkaline Phosphatase: 110 IU/L (ref 44–121)
BUN/Creatinine Ratio: 12 (ref 9–23)
BUN: 7 mg/dL (ref 6–24)
Bilirubin Total: 0.3 mg/dL (ref 0.0–1.2)
CO2: 22 mmol/L (ref 20–29)
Calcium: 9.4 mg/dL (ref 8.7–10.2)
Chloride: 104 mmol/L (ref 96–106)
Creatinine, Ser: 0.6 mg/dL (ref 0.57–1.00)
GFR calc Af Amer: 117 mL/min/{1.73_m2} (ref >59)
GFR calc non Af Amer: 102 mL/min/{1.73_m2} (ref >59)
Globulin, Total: 2.5 g/dL (ref 1.5–4.5)
Glucose: 96 mg/dL (ref 65–99)
Potassium: 4.1 mmol/L (ref 3.5–5.2)
Sodium: 141 mmol/L (ref 134–144)
Total Protein: 7 g/dL (ref 6.0–8.5)

## 2020-10-04 LAB — CBC WITH DIFFERENTIAL
Basophils Absolute: 0.1 10*3/uL (ref 0.0–0.2)
Basos: 1 %
EOS (ABSOLUTE): 0.3 10*3/uL (ref 0.0–0.4)
Eos: 3 %
Hematocrit: 41.2 % (ref 34.0–46.6)
Hemoglobin: 14.4 g/dL (ref 11.1–15.9)
Immature Grans (Abs): 0.1 10*3/uL (ref 0.0–0.1)
Immature Granulocytes: 1 %
Lymphocytes Absolute: 3.3 10*3/uL — ABNORMAL HIGH (ref 0.7–3.1)
Lymphs: 31 %
MCH: 30.3 pg (ref 26.6–33.0)
MCHC: 35 g/dL (ref 31.5–35.7)
MCV: 87 fL (ref 79–97)
Monocytes Absolute: 0.8 10*3/uL (ref 0.1–0.9)
Monocytes: 8 %
Neutrophils Absolute: 6.1 10*3/uL (ref 1.4–7.0)
Neutrophils: 56 %
RBC: 4.76 x10E6/uL (ref 3.77–5.28)
RDW: 12.8 % (ref 11.7–15.4)
WBC: 10.7 10*3/uL (ref 3.4–10.8)

## 2020-10-04 LAB — LIPID PANEL
Chol/HDL Ratio: 4.3 ratio (ref 0.0–4.4)
Cholesterol, Total: 179 mg/dL (ref 100–199)
HDL: 42 mg/dL (ref >39)
LDL Chol Calc (NIH): 92 mg/dL (ref 0–99)
Triglycerides: 266 mg/dL — ABNORMAL HIGH (ref 0–149)
VLDL Cholesterol Cal: 45 mg/dL — ABNORMAL HIGH (ref 5–40)

## 2020-10-04 LAB — HEMOGLOBIN A1C
Est. average glucose Bld gHb Est-mCnc: 146 mg/dL
Hgb A1c MFr Bld: 6.7 % — ABNORMAL HIGH (ref 4.8–5.6)

## 2020-10-04 LAB — TSH: TSH: 1.18 u[IU]/mL (ref 0.450–4.500)

## 2020-10-04 LAB — CK: Total CK: 53 U/L (ref 32–182)

## 2020-10-06 ENCOUNTER — Encounter: Payer: Self-pay | Admitting: Registered Nurse

## 2020-10-07 NOTE — Telephone Encounter (Signed)
Pt asking about if she needs to restart a statin for her recent lab work also wants chantix please advise

## 2020-10-10 ENCOUNTER — Other Ambulatory Visit: Payer: Self-pay | Admitting: Registered Nurse

## 2020-10-10 DIAGNOSIS — Z716 Tobacco abuse counseling: Secondary | ICD-10-CM

## 2020-10-10 MED ORDER — VARENICLINE TARTRATE 1 MG PO TABS: 1.0000 mg | ORAL_TABLET | Freq: Two times a day (BID) | ORAL | 1 refills | Status: DC

## 2020-10-10 MED ORDER — CHANTIX STARTING MONTH PAK 0.5 MG X 11 & 1 MG X 42 PO TABS: ORAL_TABLET | ORAL | 0 refills | Status: DC

## 2020-10-25 ENCOUNTER — Telehealth (INDEPENDENT_AMBULATORY_CARE_PROVIDER_SITE_OTHER): Payer: 59 | Admitting: Registered Nurse

## 2020-10-25 ENCOUNTER — Other Ambulatory Visit: Payer: Self-pay

## 2020-10-25 ENCOUNTER — Telehealth: Payer: Self-pay | Admitting: *Deleted

## 2020-10-25 DIAGNOSIS — J069 Acute upper respiratory infection, unspecified: Secondary | ICD-10-CM

## 2020-10-25 DIAGNOSIS — G479 Sleep disorder, unspecified: Secondary | ICD-10-CM | POA: Diagnosis not present

## 2020-10-25 MED ORDER — TRAZODONE HCL 50 MG PO TABS: 25.0000 mg | ORAL_TABLET | Freq: Every evening | ORAL | 0 refills | Status: DC | PRN

## 2020-10-25 MED ORDER — AMOXICILLIN-POT CLAVULANATE 875-125 MG PO TABS: 1.0000 | ORAL_TABLET | Freq: Two times a day (BID) | ORAL | 0 refills | Status: DC

## 2020-10-25 NOTE — Telephone Encounter (Signed)
Denial for apo-varenicline  Put in providers box

## 2020-10-25 NOTE — Progress Notes (Signed)
Telemedicine Encounter- SOAP NOTE Established Patient  This telephone encounter was conducted with the patient's (or proxy's) verbal consent via audio telecommunications: yes  Patient was instructed to have this encounter in a suitably private space; and to only have persons present to whom they give permission to participate. In addition, patient identity was confirmed by use of name plus two identifiers (DOB and address).  I discussed the limitations, risks, security and privacy concerns of performing an evaluation and management service by telephone and the availability of in person appointments. I also discussed with the patient that there may be a patient responsible charge related to this service. The patient expressed understanding and agreed to proceed.  I spent a total of 22 minutes talking with the patient or their proxy.  Patient at home Provider in office.  Chief Complaint  Patient presents with   Sore Throat    pt reports having congestion, sinus drainage, sore throat, pressure headache, pt has had booster and her flue shot and has gotten worse since last OV, reports productive cough, pt reports moving from head to her chest and is worried about URI     Subjective   Robin Small is a 59 y.o. established patient. Telephone visit today for congestion  HPI Since last visit around 3 weeks ago unfortunately congestion has resurged and worsened.  Having frontal headache, sinus pressure, pnd, cough, and feeling like some symptoms are starting in her chest. Notes a dulled but not absent sense of taste and smell.  Has had both doses of COVID vaccine. Had booster on 10/18/20. Had flu shot on 10/18/20. Notes that she did not have adverse reaction to COVID vaccine. Has been taking supportive measures including Dayquil. Limited relief. Has taken 2 at home covid tests - most recently yesterday - both negative.  In addition, notes sleep disturbance. OSA on CPAP. Good compliance.  Can't sleep without ambien. Has been on for a few years, but open to alternative. Is aware of the risks of this medication. Only other sleep aid tried has been otc melatonin - no significant effect.   No further concerns.  Patient Active Problem List   Diagnosis Date Noted   Primary osteoarthritis of right knee 09/15/2019   Primary osteoarthritis of left knee 03/03/2019   Seasonal and perennial allergic rhinitis 02/13/2019   Sinusitis, acute 08/24/2013   Vesicular rash 08/24/2013   Dyspnea 01/08/2013   OSA (obstructive sleep apnea) 01/08/2013   Muscle ache 02/08/2012   Personal history of colonic polyps 04/06/2011   HEMATOCHEZIA 02/26/2011   HEMORRHOIDS, INTERNAL 12/27/2010   EXTERNAL HEMORRHOIDS 12/27/2010   Mild intermittent asthma 06/03/2010   LUMBAR SPRAIN AND STRAIN 07/20/2008   ANXIETY DEPRESSION 06/28/2008   HYPERLIPIDEMIA 07/10/2007   DEPRESSION 05/29/2007    Past Medical History:  Diagnosis Date   Anxiety    Depression    Diverticulosis 03/2016   Mild, noted on colonoscopy   High cholesterol    History of kidney stones    Lichen sclerosus    Anus   Low blood pressure reading    OA (osteoarthritis)    OSA on CPAP    Perianal cyst    Personal history of colonic polyps 04/06/2011   PONV (postoperative nausea and vomiting)    after c section, did well after knee replacement   PPD positive    Age 59 >> reports taking medicine for one year   Pre-diabetes    per pt report   Seasonal allergies  Current Outpatient Medications  Medication Sig Dispense Refill   acetaminophen (TYLENOL) 500 MG tablet Take 1,000 mg by mouth every 6 (six) hours as needed for moderate pain.     albuterol (PROAIR HFA) 108 (90 Base) MCG/ACT inhaler INHALE 2 PUFFS INTO THE LUNGS EVERY 6 HOURS AS NEEDED FOR WHEEZING OR SHORTNESS OF BREATH 8.5 g 0   aspirin 81 MG chewable tablet Chew 1 tablet (81 mg total) by mouth 2 (two) times daily. 30 tablet 0    atorvastatin (LIPITOR) 20 MG tablet Take 1 tablet (20 mg total) by mouth daily. 90 tablet 3   Calcium Carbonate-Vitamin D (CALCIUM-D) 600-400 MG-UNIT TABS Take 2 tablets by mouth at bedtime.      clobetasol cream (TEMOVATE) 2.50 % Apply 1 application topically 2 (two) times daily as needed (skin irritation).     fluticasone (FLONASE) 50 MCG/ACT nasal spray Place 2 sprays into both nostrils daily. 16 g 6   HYDROcodone-acetaminophen (NORCO/VICODIN) 5-325 MG tablet Take 1-2 tablets by mouth every 4 (four) hours as needed for moderate pain (pain score 4-6). 30 tablet 0   levocetirizine (XYZAL) 5 MG tablet TAKE 1 TABLET(5 MG) BY MOUTH EVERY EVENING 90 tablet 3   montelukast (SINGULAIR) 10 MG tablet Take 1 tablet (10 mg total) by mouth at bedtime. 30 tablet 3   Multiple Vitamins-Minerals (AIRBORNE PO) Take 2 tablets by mouth daily.     Polyethyl Glyc-Propyl Glyc PF (SYSTANE PRESERVATIVE FREE) 0.4-0.3 % SOLN Instill 1-2 drops in each eye as needed. 28 each 0   varenicline (CHANTIX STARTING MONTH PAK) 0.5 MG X 11 & 1 MG X 42 tablet Take one 0.5 mg tablet by mouth once daily for 3 days, then increase to one 0.5 mg tablet twice daily for 4 days, then increase to one 1 mg tablet twice daily. 53 tablet 0   varenicline (CHANTIX) 1 MG tablet Take 1 tablet (1 mg total) by mouth 2 (two) times daily. 60 tablet 1   zolpidem (AMBIEN) 10 MG tablet Take 10 mg by mouth at bedtime.     amoxicillin-clavulanate (AUGMENTIN) 875-125 MG tablet Take 1 tablet by mouth 2 (two) times daily. 14 tablet 0   traZODone (DESYREL) 50 MG tablet Take 0.5-1 tablets (25-50 mg total) by mouth at bedtime as needed for sleep. 90 tablet 0   No current facility-administered medications for this visit.    Allergies  Allergen Reactions   Codeine Nausea Only    Social History   Socioeconomic History   Marital status: Married    Spouse name: Not on file   Number of children: Not on file   Years of education: Not on file     Highest education level: Not on file  Occupational History   Occupation: internet support  Tobacco Use   Smoking status: Current Some Day Smoker    Packs/day: 0.50    Years: 42.00    Pack years: 21.00    Types: Cigarettes    Last attempt to quit: 07/31/2019    Years since quitting: 1.2   Smokeless tobacco: Never Used   Tobacco comment: 1 pack per week on and off  Vaping Use   Vaping Use: Never used  Substance and Sexual Activity   Alcohol use: Yes    Alcohol/week: 0.0 standard drinks    Comment: rare   Drug use: No   Sexual activity: Yes    Partners: Male    Birth control/protection: Post-menopausal, Surgical  Other Topics Concern   Not on file  Social History Narrative   Exercise-- daily for 1 hour   Social Determinants of Health   Financial Resource Strain:    Difficulty of Paying Living Expenses: Not on file  Food Insecurity:    Worried About Charity fundraiser in the Last Year: Not on file   [employer] of Food in the Last Year: Not on file  Transportation Needs:    Lack of Transportation (Medical): Not on file   Lack of Transportation (Non-Medical): Not on file  Physical Activity:    Days of Exercise per Week: Not on file   Minutes of Exercise per Session: Not on file  Stress:    Feeling of Stress : Not on file  Social Connections:    Frequency of Communication with Friends and Family: Not on file   Frequency of Social Gatherings with Friends and Family: Not on file   Attends Religious Services: Not on file   Active Member of Clubs or Organizations: Not on file   Attends Archivist Meetings: Not on file   Marital Status: Not on file  Intimate Partner Violence:    Fear of Current or Ex-Partner: Not on file   Emotionally Abused: Not on file   Physically Abused: Not on file   Sexually Abused: Not on file    ROS Per hpi   Objective   Vitals as reported by the patient: There were no vitals filed for this  visit.  Robin Small was seen today for sore throat.  Diagnoses and all orders for this visit:  Acute upper respiratory infection -     amoxicillin-clavulanate (AUGMENTIN) 875-125 MG tablet; Take 1 tablet by mouth 2 (two) times daily.  Sleep disturbance -     traZODone (DESYREL) 50 MG tablet; Take 0.5-1 tablets (25-50 mg total) by mouth at bedtime as needed for sleep.   PLAN  augmentin po bid for 7 days.  Trazodone 25-50mg  PO qhs PRN for sleep.  Continue supportive measures, ensure not to double up on medications (ie dayquil contains acetaminophen, do not also take tylenol)  Return if symptoms worsen or fail to improve. Pt can have drive up COVID and flu testing if symptoms persist.  Patient encouraged to call clinic with any questions, comments, or concerns.   I discussed the assessment and treatment plan with the patient. The patient was provided an opportunity to ask questions and all were answered. The patient agreed with the plan and demonstrated an understanding of the instructions.   The patient was advised to call back or seek an in-person evaluation if the symptoms worsen or if the condition fails to improve as anticipated.  I provided 22 minutes of non-face-to-face time during this encounter.  Maximiano Coss, NP  [facility] at [facility]

## 2020-10-28 ENCOUNTER — Other Ambulatory Visit: Payer: Self-pay

## 2020-10-28 ENCOUNTER — Ambulatory Visit (INDEPENDENT_AMBULATORY_CARE_PROVIDER_SITE_OTHER): Payer: 59 | Admitting: Registered Nurse

## 2020-10-28 DIAGNOSIS — R6889 Other general symptoms and signs: Secondary | ICD-10-CM

## 2020-10-28 DIAGNOSIS — J069 Acute upper respiratory infection, unspecified: Secondary | ICD-10-CM

## 2020-10-28 NOTE — Addendum Note (Signed)
Addended by: Norton Blizzard R on: 14/60/4799 10:21 AM   Modules accepted: Level of Service

## 2020-10-29 LAB — SARS-COV-2, NAA 2 DAY TAT

## 2020-10-29 LAB — NOVEL CORONAVIRUS, NAA: SARS-CoV-2, NAA: NOT DETECTED

## 2020-11-02 ENCOUNTER — Other Ambulatory Visit: Payer: Self-pay | Admitting: Registered Nurse

## 2020-11-02 DIAGNOSIS — J301 Allergic rhinitis due to pollen: Secondary | ICD-10-CM

## 2020-11-24 ENCOUNTER — Encounter: Payer: Self-pay | Admitting: Registered Nurse

## 2020-11-25 MED ORDER — ZOLPIDEM TARTRATE 10 MG PO TABS: 10.0000 mg | ORAL_TABLET | Freq: Every day | ORAL | 0 refills | Status: DC

## 2020-12-22 ENCOUNTER — Other Ambulatory Visit: Payer: Self-pay | Admitting: Registered Nurse

## 2020-12-22 DIAGNOSIS — J301 Allergic rhinitis due to pollen: Secondary | ICD-10-CM

## 2020-12-23 ENCOUNTER — Encounter: Payer: 59 | Admitting: Registered Nurse

## 2021-01-20 ENCOUNTER — Other Ambulatory Visit: Payer: Self-pay | Admitting: Registered Nurse

## 2021-01-20 DIAGNOSIS — G479 Sleep disorder, unspecified: Secondary | ICD-10-CM

## 2021-01-20 NOTE — Telephone Encounter (Signed)
   Notes to clinic Is this pt associated with your practice? No PCP listed, however, Orland Mustard, NP wrote this rx.

## 2021-01-20 NOTE — Telephone Encounter (Signed)
Patient is requesting a refill of the following medications: Requested Prescriptions   Pending Prescriptions Disp Refills   traZODone (DESYREL) 50 MG tablet [Pharmacy Med Name: TRAZODONE 50MG  TABLETS] 90 tablet 0    Sig: TAKE 1/2 TO 1 TABLET(25 TO 50 MG) BY MOUTH AT BEDTIME AS NEEDED FOR SLEEP    Date of patient request: 01/20/21 Last office visit: 10/03/20 Date of last refill: 10/03/20 Last refill amount: 90 +0  Follow up time period per chart: none (can appt on 12/23/20)

## 2021-01-31 ENCOUNTER — Other Ambulatory Visit: Payer: Self-pay | Admitting: Registered Nurse

## 2021-01-31 DIAGNOSIS — J301 Allergic rhinitis due to pollen: Secondary | ICD-10-CM

## 2021-01-31 NOTE — Telephone Encounter (Signed)
Requested Prescriptions  Pending Prescriptions Disp Refills  . montelukast (SINGULAIR) 10 MG tablet [Pharmacy Med Name: MONTELUKAST 10MG  TABLETS] 90 tablet 1    Sig: TAKE 1 TABLET(10 MG) BY MOUTH AT BEDTIME     Pulmonology:  Leukotriene Inhibitors Passed - 01/31/2021  3:12 AM      Passed - Valid encounter within last 12 months    Recent Outpatient Visits          3 months ago Acute upper respiratory infection   [facility], Delfino Lovett, NP   4 months ago Flu vaccine need   [facility], Delfino Lovett, NP   8 months ago Allergic reaction, initial encounter   [facility], Delfino Lovett, NP   10 months ago Dyslipidemia   [facility] [place], MD   10 months ago Screening for HIV (human immunodeficiency virus)   [facility], Arlie Solomons, MD

## 2021-03-09 LAB — RESULTS CONSOLE HPV: CHL HPV: NEGATIVE

## 2021-03-09 LAB — HM PAP SMEAR: HM Pap smear: NEGATIVE

## 2021-03-12 ENCOUNTER — Encounter: Payer: Self-pay | Admitting: Registered Nurse

## 2021-03-13 ENCOUNTER — Other Ambulatory Visit: Payer: Self-pay | Admitting: Obstetrics and Gynecology

## 2021-03-13 DIAGNOSIS — R928 Other abnormal and inconclusive findings on diagnostic imaging of breast: Secondary | ICD-10-CM

## 2021-04-03 ENCOUNTER — Other Ambulatory Visit: Payer: Self-pay

## 2021-04-03 ENCOUNTER — Ambulatory Visit: Admission: RE | Admit: 2021-04-03 | Payer: 59 | Source: Ambulatory Visit

## 2021-04-03 ENCOUNTER — Ambulatory Visit
Admission: RE | Admit: 2021-04-03 | Discharge: 2021-04-03 | Disposition: A | Discharge status: Home / Self Care | Payer: 59 | Source: Ambulatory Visit | Attending: Obstetrics and Gynecology | Admitting: Obstetrics and Gynecology

## 2021-04-03 DIAGNOSIS — R928 Other abnormal and inconclusive findings on diagnostic imaging of breast: Secondary | ICD-10-CM

## 2021-04-03 LAB — HM MAMMOGRAPHY

## 2021-04-05 NOTE — Telephone Encounter (Signed)
Patient results has been entered in the chart.

## 2021-04-22 ENCOUNTER — Other Ambulatory Visit: Payer: Self-pay | Admitting: Registered Nurse

## 2021-04-22 DIAGNOSIS — G479 Sleep disorder, unspecified: Secondary | ICD-10-CM

## 2021-04-25 ENCOUNTER — Other Ambulatory Visit: Payer: Self-pay | Admitting: Registered Nurse

## 2021-04-26 NOTE — Telephone Encounter (Signed)
Requesting Ambien 10 mg refill. thanks

## 2021-05-01 ENCOUNTER — Ambulatory Visit: Payer: 59 | Admitting: Registered Nurse

## 2021-05-24 ENCOUNTER — Ambulatory Visit: Payer: 59 | Admitting: Registered Nurse

## 2021-06-30 ENCOUNTER — Ambulatory Visit: Payer: 59 | Admitting: Registered Nurse

## 2021-08-25 ENCOUNTER — Encounter: Payer: Self-pay | Admitting: Internal Medicine

## 2021-09-05 ENCOUNTER — Other Ambulatory Visit: Payer: Self-pay | Admitting: Registered Nurse

## 2021-09-22 ENCOUNTER — Ambulatory Visit
Admission: EM | Admit: 2021-09-22 | Discharge: 2021-09-22 | Disposition: A | Discharge status: Home / Self Care | Payer: 59 | Attending: Emergency Medicine | Admitting: Emergency Medicine

## 2021-09-22 DIAGNOSIS — J301 Allergic rhinitis due to pollen: Secondary | ICD-10-CM

## 2021-09-22 DIAGNOSIS — J069 Acute upper respiratory infection, unspecified: Secondary | ICD-10-CM | POA: Diagnosis not present

## 2021-09-22 MED ORDER — PSEUDOEPHEDRINE HCL 30 MG PO TABS: 60.0000 mg | ORAL_TABLET | Freq: Three times a day (TID) | ORAL | 0 refills | Status: AC | PRN

## 2021-09-22 MED ORDER — FLUTICASONE PROPIONATE 50 MCG/ACT NA SUSP: 2.0000 | Freq: Every day | NASAL | 0 refills | Status: DC

## 2021-09-22 MED ORDER — METHYLPREDNISOLONE 4 MG PO TBPK: ORAL_TABLET | ORAL | 0 refills | Status: DC

## 2021-09-22 MED ORDER — AMOXICILLIN-POT CLAVULANATE 875-125 MG PO TABS: 1.0000 | ORAL_TABLET | Freq: Two times a day (BID) | ORAL | 0 refills | Status: AC

## 2021-09-22 MED ORDER — TIZANIDINE HCL 4 MG PO CAPS: 4.0000 mg | ORAL_CAPSULE | Freq: Four times a day (QID) | ORAL | 0 refills | Status: DC | PRN

## 2021-09-22 NOTE — Discharge Instructions (Addendum)
Dear Ms. Leser,  I very much appreciate your patience in waiting to see me today.  For your ear pain, I recommend that you begin taking an antibiotic called amoxicillin/clavulanate, also known as Augmentin, 1 tablet twice daily for the next 7 days.  I also feel that a steroid would be of benefit to you so I have prescribed a Medrol Dosepak, please take 1 row of tablets daily, take all tablets in a row at 1 time.  Finally, pseudoephedrine, also known as Sudafed, is recommended to dry up your mucous membranes and decrease the amount of fluid and pressure in your eardrum which may be causing some of your discomfort.    Based on my physical exam findings today, you do not appear to be someone who suffers from chronic allergies however I do see that you have Flonase on your med list.  Flonase may also be of assistance in relieving some of your symptoms, you could use it twice daily for the next week while you are taking the antibiotics to see if this helps as well.  I have also renewed your previous prescription for tizanidine 4 mg every 6 hours as needed for muscle spasm.  Thank you for your visit to urgent care today and I appreciate your confidence in my care.  I hope you feel better soon.

## 2021-09-22 NOTE — ED Provider Notes (Signed)
[facility]    CSN: [id number] Arrival date & time: 09/22/21  1216      History   Chief Complaint Chief Complaint  Patient presents with   Otalgia    HPI Robin Small is a 60 y.o. female.   New patient  Pt reports having pain in her left ear, congestion that started 2 days ago. Patient states taking at home covid test that were negative.  Patient describes the pain as sharp, stabbing and unbearable.  Patient states that when the pain occurs, it makes her very tense, states she feels that she is getting a lot of soreness in her neck and shoulder muscles because of this.  Patient states she had to take an old tizanidine tablet to help her get to sleep last night, decided to come in to get this checked out today.  Patient states she has had the same issue in the past, states providers have looked in her ears and advised her that there is nothing wrong.  Patient states she is also noticed that when she turns her head to the left she has some tenderness anterior to them below her left ear, states she has noticed that she has swollen nodules on both sides of her neck that are movable but sore when she touches them.  Patient denies history of tonsil stones or sialadenitis, she smokes Sandrea Matte, half a pack per day  The history is provided by the patient.   Past Medical History:  Diagnosis Date   Anxiety    Depression    Diverticulosis 03/2016   Mild, noted on colonoscopy   High cholesterol    History of kidney stones    Lichen sclerosus    Anus   Low blood pressure reading    OA (osteoarthritis)    OSA on CPAP    Perianal cyst    Personal history of colonic polyps 04/06/2011   PONV (postoperative nausea and vomiting)    after c section, did well after knee replacement   PPD positive    Age 47 >> reports taking medicine for one year   Pre-diabetes    per pt report   Seasonal allergies     Patient Active Problem List   Diagnosis Date Noted   Primary osteoarthritis of  right knee 09/15/2019   Primary osteoarthritis of left knee 03/03/2019   Seasonal and perennial allergic rhinitis 02/13/2019   Sinusitis, acute 08/24/2013   Vesicular rash 08/24/2013   Dyspnea 01/08/2013   OSA (obstructive sleep apnea) 01/08/2013   Muscle ache 02/08/2012   Personal history of colonic polyps 04/06/2011   HEMORRHOIDS, INTERNAL 12/27/2010   EXTERNAL HEMORRHOIDS 12/27/2010   Mild intermittent asthma 06/03/2010   LUMBAR SPRAIN AND STRAIN 07/20/2008   ANXIETY DEPRESSION 06/28/2008   HYPERLIPIDEMIA 07/10/2007   DEPRESSION 05/29/2007    Past Surgical History:  Procedure Laterality Date   ARTHROSCOPIC KNEE Left    CESAREAN SECTION     COLONOSCOPY W/ POLYPECTOMY  04/11/2016   ENDOMETRIAL ABLATION     GANGLION CYST EXCISION Right    HEMORROIDECTOMY     lichen sclerosus lesion excision     Anus   TOTAL KNEE ARTHROPLASTY Left 03/03/2019   Procedure: TOTAL KNEE ARTHROPLASTY;  Surgeon: Melrose Nakayama, MD;  Location: [facility];  Service: Orthopedics;  Laterality: Left;   TOTAL KNEE ARTHROPLASTY Right 09/15/2019   Procedure: RIGHT TOTAL KNEE ARTHROPLASTY;  Surgeon: Melrose Nakayama, MD;  Location: [facility];  Service: Orthopedics;  Laterality: Right;   TUBAL  LIGATION      OB History   No obstetric history on file.      Home Medications    Prior to Admission medications   Medication Sig Start Date End Date Taking? Authorizing Provider  pseudoephedrine (SUDAFED) 30 MG tablet Take 2 tablets (60 mg total) by mouth every 8 (eight) hours as needed for up to 3 days for congestion. 09/22/21 09/25/21 Yes Lynden Oxford Scales, PA-C  acetaminophen (TYLENOL) 500 MG tablet Take 1,000 mg by mouth every 6 (six) hours as needed for moderate pain.    [provider]  albuterol (PROAIR HFA) 108 (90 Base) MCG/ACT inhaler INHALE 2 PUFFS INTO THE LUNGS EVERY 6 HOURS AS NEEDED FOR WHEEZING OR SHORTNESS OF BREATH 10/03/20   Maximiano Coss, NP  amoxicillin-clavulanate (AUGMENTIN) 875-125  MG tablet Take 1 tablet by mouth 2 (two) times daily for 7 days. 09/22/21 09/29/21  Lynden Oxford Scales, PA-C  aspirin 81 MG chewable tablet Chew 1 tablet (81 mg total) by mouth 2 (two) times daily. 09/16/19   Loni Dolly, PA-C  atorvastatin (LIPITOR) 20 MG tablet Take 1 tablet (20 mg total) by mouth daily. 03/17/20   Forrest Moron, MD  Calcium Carbonate-Vitamin D (CALCIUM-D) 600-400 MG-UNIT TABS Take 2 tablets by mouth at bedtime.     [provider]  clobetasol cream (TEMOVATE) 9.79 % Apply 1 application topically 2 (two) times daily as needed (skin irritation).    [provider]  fluticasone (FLONASE) 50 MCG/ACT nasal spray Place 2 sprays into both nostrils daily. 09/22/21   Lynden Oxford Scales, PA-C  HYDROcodone-acetaminophen (NORCO/VICODIN) 5-325 MG tablet Take 1-2 tablets by mouth every 4 (four) hours as needed for moderate pain (pain score 4-6). 09/16/19   Loni Dolly, PA-C  levocetirizine (XYZAL) 5 MG tablet TAKE 1 TABLET(5 MG) BY MOUTH EVERY EVENING 11/02/20   Maximiano Coss, NP  methylPREDNISolone (MEDROL DOSEPAK) 4 MG TBPK tablet Take 24 mg on day 1, 20 mg on day 2, 16 mg on day 3, 12 mg on day 4, 8 mg on day 5, 4 mg on day 6. 09/22/21  Yes Lynden Oxford Scales, PA-C  montelukast (SINGULAIR) 10 MG tablet TAKE 1 TABLET(10 MG) BY MOUTH AT BEDTIME 01/31/21   Maximiano Coss, NP  Multiple Vitamins-Minerals (AIRBORNE PO) Take 2 tablets by mouth daily.    [provider]  Polyethyl Glyc-Propyl Glyc PF (SYSTANE PRESERVATIVE FREE) 0.4-0.3 % SOLN Instill 1-2 drops in each eye as needed. 05/23/20   Montine Circle, PA-C  tiZANidine (ZANAFLEX) 4 MG capsule Take 1 capsule (4 mg total) by mouth 4 (four) times daily as needed for up to 3 days for muscle spasms. 09/22/21 09/25/21 Yes Lynden Oxford Scales, PA-C  traZODone (DESYREL) 50 MG tablet TAKE 1/2 TO 1 TABLET(25 TO 50 MG) BY MOUTH AT BEDTIME AS NEEDED FOR SLEEP 04/24/21   Maximiano Coss, NP  varenicline (CHANTIX  STARTING MONTH PAK) 0.5 MG X 11 & 1 MG X 42 tablet Take one 0.5 mg tablet by mouth once daily for 3 days, then increase to one 0.5 mg tablet twice daily for 4 days, then increase to one 1 mg tablet twice daily. 10/10/20   Maximiano Coss, NP  varenicline (CHANTIX) 1 MG tablet Take 1 tablet (1 mg total) by mouth 2 (two) times daily. 10/10/20   Maximiano Coss, NP  zolpidem (AMBIEN) 10 MG tablet TAKE 1 TABLET(10 MG) BY MOUTH AT BEDTIME 09/06/21   Maximiano Coss, NP    Family History Family History  Problem  Relation Age of Onset   Colon polyps Mother    Diabetes Mother    Hypertension Mother    Heart disease Mother    Hyperlipidemia Mother    Alcohol abuse Brother    Hepatitis Brother        hep c   Breast cancer Maternal Grandmother        breast   Lung cancer Father    Colon cancer Neg Hx     Social History Social History   Tobacco Use   Smoking status: Some Days    Packs/day: 0.50    Years: 42.00    Pack years: 21.00    Types: Cigarettes    Last attempt to quit: 07/31/2019    Years since quitting: 2.1   Smokeless tobacco: Never   Tobacco comments:    1 pack per week on and off  Vaping Use   Vaping Use: Never used  Substance Use Topics   Alcohol use: Yes    Alcohol/week: 0.0 standard drinks    Comment: rare   Drug use: No     Allergies   Codeine   Review of Systems Review of Systems Pertinent findings noted in history of present illness.    Physical Exam Triage Vital Signs ED Triage Vitals  Enc Vitals Group     BP      Pulse      Resp      Temp      Temp src      SpO2      Weight      Height      Head Circumference      Peak Flow      Pain Score      Pain Loc      Pain Edu?      Excl. in [place]?    No data found.  Updated Vital Signs BP 121/75 (BP Location: Right Arm)   Pulse 63   Temp 98.6 F (37 C) (Oral)   Resp 20   Wt 182 lb (82.6 kg)   SpO2 98%   BMI 34.39 kg/m   Visual Acuity Right Eye Distance:   Left Eye Distance:   Bilateral  Distance:    Right Eye Near:   Left Eye Near:    Bilateral Near:     Physical Exam Vitals and nursing note reviewed.  Constitutional:      Appearance: Normal appearance.  HENT:     Head: Normocephalic and atraumatic.     Right Ear: Ear canal and external ear normal. Tympanic membrane is bulging.     Left Ear: Ear canal and external ear normal. Tympanic membrane is bulging.     Ears:     Comments: Bilateral TMs bulging with clear fluid, no erythema was appreciated.    Nose: Nose normal.     Mouth/Throat:     Mouth: Mucous membranes are moist.     Pharynx: Oropharynx is clear.     Comments: Salivary glands are remarkably enlarged and tender to palpation bilaterally Eyes:     Extraocular Movements: Extraocular movements intact.     Conjunctiva/sclera: Conjunctivae normal.     Pupils: Pupils are equal, round, and reactive to light.  Cardiovascular:     Rate and Rhythm: Normal rate and regular rhythm.     Pulses: Normal pulses.     Heart sounds: Normal heart sounds.  Pulmonary:     Effort: Pulmonary effort is normal.     Breath sounds: Normal breath  sounds.  Abdominal:     General: Abdomen is flat. Bowel sounds are normal.     Palpations: Abdomen is soft.  Musculoskeletal:        General: Normal range of motion.     Cervical back: Normal range of motion and neck supple. Pain with movement (Tender, left-sided) present.  Lymphadenopathy:     Cervical: Cervical adenopathy present.     Right cervical: No superficial cervical adenopathy.    Left cervical: Superficial cervical adenopathy present.  Skin:    General: Skin is warm and dry.  Neurological:     General: No focal deficit present.     Mental Status: She is alert and oriented to person, place, and time. Mental status is at baseline.  Psychiatric:        Mood and Affect: Mood normal.        Behavior: Behavior normal.     UC Treatments / Results  Labs (all labs ordered are listed, but only abnormal results are  displayed) Labs Reviewed - No data to display  EKG   Radiology No results found.  Procedures Procedures (including critical care time)  Medications Ordered in UC Medications - No data to display  Initial Impression / Assessment and Plan / UC Course  I have reviewed the triage vital signs and the nursing notes.  Pertinent labs & imaging results that were available during my care of the patient were reviewed by me and considered in my medical decision making (see chart for details).     Physical exam today is unremarkable.  Patient and I discussed possible neurological causes of her ear pain as well as an underlying eczema or allergy in her ears.  Patient states she does have a history of allergies.  Smoking cessation was advised patient states she would think about this.  We discussed antibiotics and or steroids to treat her immediate discomfort, patient was agreeable to both.  I also recommend Sudafed to help dry out mucous membranes.  Patient was advised that she does not have significant improvement after course of therapy, she should meet with her primary care provider to discuss possibly having ultrasound of her salivary glands to rule out something more sinister than infection.  Patient verbalized understanding and agreement of plan as discussed.  All questions were addressed during visit.  Please see discharge instructions below for further details of plan.  Final Clinical Impressions(s) / UC Diagnoses   Final diagnoses:  Acute upper respiratory infection  Seasonal allergic rhinitis due to pollen     Discharge Instructions      Dear Ms. Savin,  I very much appreciate your patience in waiting to see me today.  For your ear pain, I recommend that you begin taking an antibiotic called amoxicillin/clavulanate, also known as Augmentin, 1 tablet twice daily for the next 7 days.  I also feel that a steroid would be of benefit to you so I have prescribed a Medrol Dosepak,  please take 1 row of tablets daily, take all tablets in a row at 1 time.  Finally, pseudoephedrine, also known as Sudafed, is recommended to dry up your mucous membranes and decrease the amount of fluid and pressure in your eardrum which may be causing some of your discomfort.    Based on my physical exam findings today, you do not appear to be someone who suffers from chronic allergies however I do see that you have Flonase on your med list.  Asencion Islam may also be of assistance in  relieving some of your symptoms, you could use it twice daily for the next week while you are taking the antibiotics to see if this helps as well.  I have also renewed your previous prescription for tizanidine 4 mg every 6 hours as needed for muscle spasm.  Thank you for your visit to urgent care today and I appreciate your confidence in my care.  I hope you feel better soon.     ED Prescriptions     Medication Sig Dispense Auth. Provider   tiZANidine (ZANAFLEX) 4 MG capsule Take 1 capsule (4 mg total) by mouth 4 (four) times daily as needed for up to 3 days for muscle spasms. 12 capsule Lynden Oxford Scales, PA-C   methylPREDNISolone (MEDROL DOSEPAK) 4 MG TBPK tablet Take 24 mg on day 1, 20 mg on day 2, 16 mg on day 3, 12 mg on day 4, 8 mg on day 5, 4 mg on day 6. 21 tablet Lynden Oxford Scales, PA-C   amoxicillin-clavulanate (AUGMENTIN) 875-125 MG tablet Take 1 tablet by mouth 2 (two) times daily for 7 days. 14 tablet Lynden Oxford Scales, PA-C   fluticasone (FLONASE) 50 MCG/ACT nasal spray Place 2 sprays into both nostrils daily. 16 g Lynden Oxford Scales, PA-C   pseudoephedrine (SUDAFED) 30 MG tablet Take 2 tablets (60 mg total) by mouth every 8 (eight) hours as needed for up to 3 days for congestion. 18 tablet Lynden Oxford Scales, PA-C      PDMP not reviewed this encounter.   Lynden Oxford Scales, PA-C 09/24/21 1058

## 2021-09-22 NOTE — ED Triage Notes (Signed)
Pt reports having pain to her left ear, congestion. Patient states taking at home covid test that were negative.   Ear pain started about 2 days ago.

## 2021-10-02 ENCOUNTER — Other Ambulatory Visit: Payer: Self-pay | Admitting: Registered Nurse

## 2021-10-02 DIAGNOSIS — J301 Allergic rhinitis due to pollen: Secondary | ICD-10-CM

## 2021-11-18 ENCOUNTER — Emergency Department (HOSPITAL_COMMUNITY): Payer: 59

## 2021-11-18 ENCOUNTER — Other Ambulatory Visit: Payer: Self-pay

## 2021-11-18 ENCOUNTER — Encounter (HOSPITAL_COMMUNITY): Payer: Self-pay

## 2021-11-18 ENCOUNTER — Emergency Department (HOSPITAL_COMMUNITY)
Admission: EM | Admit: 2021-11-18 | Discharge: 2021-11-18 | Disposition: A | Discharge status: Home / Self Care | Payer: 59 | Attending: Student | Admitting: Student

## 2021-11-18 DIAGNOSIS — S0081XA Abrasion of other part of head, initial encounter: Secondary | ICD-10-CM | POA: Insufficient documentation

## 2021-11-18 DIAGNOSIS — Y92410 Unspecified street and highway as the place of occurrence of the external cause: Secondary | ICD-10-CM | POA: Diagnosis not present

## 2021-11-18 DIAGNOSIS — S60312A Abrasion of left thumb, initial encounter: Secondary | ICD-10-CM | POA: Diagnosis not present

## 2021-11-18 DIAGNOSIS — R1012 Left upper quadrant pain: Secondary | ICD-10-CM | POA: Insufficient documentation

## 2021-11-18 DIAGNOSIS — R079 Chest pain, unspecified: Secondary | ICD-10-CM | POA: Insufficient documentation

## 2021-11-18 DIAGNOSIS — Z96653 Presence of artificial knee joint, bilateral: Secondary | ICD-10-CM | POA: Diagnosis not present

## 2021-11-18 DIAGNOSIS — S0990XA Unspecified injury of head, initial encounter: Secondary | ICD-10-CM | POA: Diagnosis present

## 2021-11-18 DIAGNOSIS — F17210 Nicotine dependence, cigarettes, uncomplicated: Secondary | ICD-10-CM | POA: Insufficient documentation

## 2021-11-18 DIAGNOSIS — Z7982 Long term (current) use of aspirin: Secondary | ICD-10-CM | POA: Insufficient documentation

## 2021-11-18 DIAGNOSIS — J45909 Unspecified asthma, uncomplicated: Secondary | ICD-10-CM | POA: Insufficient documentation

## 2021-11-18 LAB — CBC WITH DIFFERENTIAL/PLATELET
Abs Immature Granulocytes: 0.05 10*3/uL (ref 0.00–0.07)
Basophils Absolute: 0.1 10*3/uL (ref 0.0–0.1)
Basophils Relative: 0 %
Eosinophils Absolute: 0.2 10*3/uL (ref 0.0–0.5)
Eosinophils Relative: 1 %
HCT: 45.4 % (ref 36.0–46.0)
Hemoglobin: 15.2 g/dL — ABNORMAL HIGH (ref 12.0–15.0)
Immature Granulocytes: 0 %
Lymphocytes Relative: 20 %
Lymphs Abs: 2.3 10*3/uL (ref 0.7–4.0)
MCH: 29.5 pg (ref 26.0–34.0)
MCHC: 33.5 g/dL (ref 30.0–36.0)
MCV: 88.2 fL (ref 80.0–100.0)
Monocytes Absolute: 1 10*3/uL (ref 0.1–1.0)
Monocytes Relative: 9 %
Neutro Abs: 8.4 10*3/uL — ABNORMAL HIGH (ref 1.7–7.7)
Neutrophils Relative %: 70 %
Platelets: 287 10*3/uL (ref 150–400)
RBC: 5.15 MIL/uL — ABNORMAL HIGH (ref 3.87–5.11)
RDW: 12.6 % (ref 11.5–15.5)
WBC: 12 10*3/uL — ABNORMAL HIGH (ref 4.0–10.5)
nRBC: 0 % (ref 0.0–0.2)

## 2021-11-18 LAB — COMPREHENSIVE METABOLIC PANEL
ALT: 18 U/L (ref 0–44)
AST: 15 U/L (ref 15–41)
Albumin: 3.9 g/dL (ref 3.5–5.0)
Alkaline Phosphatase: 76 U/L (ref 38–126)
Anion gap: 9 (ref 5–15)
BUN: 15 mg/dL (ref 6–20)
CO2: 25 mmol/L (ref 22–32)
Calcium: 9.4 mg/dL (ref 8.9–10.3)
Chloride: 104 mmol/L (ref 98–111)
Creatinine, Ser: 0.78 mg/dL (ref 0.44–1.00)
GFR, Estimated: >60 mL/min (ref >60)
Glucose, Bld: 103 mg/dL — ABNORMAL HIGH (ref 70–99)
Potassium: 3.7 mmol/L (ref 3.5–5.1)
Sodium: 138 mmol/L (ref 135–145)
Total Bilirubin: 0.5 mg/dL (ref 0.3–1.2)
Total Protein: 7.3 g/dL (ref 6.5–8.1)

## 2021-11-18 MED ORDER — ONDANSETRON HCL 4 MG/2ML IJ SOLN
4.0000 mg | Freq: Once | INTRAMUSCULAR | Status: AC
  Administered 2021-11-18: 4 mg via INTRAVENOUS
  Filled 2021-11-18: qty 2

## 2021-11-18 MED ORDER — IOHEXOL 300 MG/ML  SOLN
100.0000 mL | Freq: Once | INTRAMUSCULAR | Status: AC | PRN
  Administered 2021-11-18: 100 mL via INTRAVENOUS

## 2021-11-18 MED ORDER — MORPHINE SULFATE (PF) 4 MG/ML IV SOLN
4.0000 mg | Freq: Once | INTRAVENOUS | Status: AC
  Administered 2021-11-18: 4 mg via INTRAVENOUS
  Filled 2021-11-18: qty 1
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
## 2021-11-19 ENCOUNTER — Other Ambulatory Visit: Payer: Self-pay | Admitting: Registered Nurse

## 2021-11-20 ENCOUNTER — Other Ambulatory Visit: Payer: Self-pay

## 2021-11-20 ENCOUNTER — Encounter: Payer: Self-pay | Admitting: Registered Nurse

## 2021-11-20 ENCOUNTER — Telehealth (INDEPENDENT_AMBULATORY_CARE_PROVIDER_SITE_OTHER): Payer: 59 | Admitting: Registered Nurse

## 2021-11-20 DIAGNOSIS — R0782 Intercostal pain: Secondary | ICD-10-CM | POA: Diagnosis not present

## 2021-11-20 DIAGNOSIS — G479 Sleep disorder, unspecified: Secondary | ICD-10-CM

## 2021-11-20 DIAGNOSIS — E785 Hyperlipidemia, unspecified: Secondary | ICD-10-CM

## 2021-11-20 MED ORDER — TRAMADOL HCL 50 MG PO TABS: 50.0000 mg | ORAL_TABLET | Freq: Three times a day (TID) | ORAL | 0 refills | Status: DC | PRN

## 2021-11-20 MED ORDER — DICLOFENAC SODIUM 75 MG PO TBEC: 75.0000 mg | DELAYED_RELEASE_TABLET | Freq: Two times a day (BID) | ORAL | 0 refills | Status: DC

## 2021-11-20 MED ORDER — TIZANIDINE HCL 4 MG PO CAPS: 4.0000 mg | ORAL_CAPSULE | Freq: Three times a day (TID) | ORAL | 0 refills | Status: DC | PRN

## 2021-11-20 MED ORDER — ZOLPIDEM TARTRATE 10 MG PO TABS: ORAL_TABLET | ORAL | 0 refills | Status: DC

## 2021-11-20 MED ORDER — ATORVASTATIN CALCIUM 20 MG PO TABS: 20.0000 mg | ORAL_TABLET | Freq: Every day | ORAL | 3 refills | Status: DC

## 2021-11-20 NOTE — Telephone Encounter (Signed)
Patient is requesting a refill of the following medications: Requested Prescriptions   Pending Prescriptions Disp Refills   tiZANidine (ZANAFLEX) 4 MG capsule [Pharmacy Med Name: TIZANIDINE 4MG  CAPSULES] 12 capsule 0    Sig: TAKE 1 CAPSULE(4 MG) BY MOUTH FOUR TIMES DAILY FOR UP TO 3 DAYS AS NEEDED FOR MUSCLE SPASMS    Date of patient request: 11/19/2021 Last office visit: 10/03/2020 Date of last refill: 03/04/2019 Last refill amount: 40 tablets 1 refill  Follow up time period per chart: 12/06/2021

## 2021-11-20 NOTE — Patient Instructions (Signed)
° ° ° °  If you have lab work done today you will be contacted with your lab results within the next 2 weeks.  If you have not heard from us then please contact us. The fastest way to get your results is to register for My Chart. ° ° °IF you received an x-ray today, you will receive an invoice from [facility]. Please contact [facility] at [phone] with questions or concerns regarding your invoice.  ° °IF you received labwork today, you will receive an invoice from [facility]. Please contact [facility] at [phone] with questions or concerns regarding your invoice.  ° °Our billing staff will not be able to assist you with questions regarding bills from these companies. ° °You will be contacted with the lab results as soon as they are available. The fastest way to get your results is to activate your My Chart account. Instructions are located on the last page of this paperwork. If you have not heard from us regarding the results in 2 weeks, please contact this office. °  ° ° ° °
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
## 2021-11-28 ENCOUNTER — Other Ambulatory Visit: Payer: Self-pay | Admitting: Registered Nurse

## 2021-11-28 ENCOUNTER — Encounter: Payer: Self-pay | Admitting: Registered Nurse

## 2021-12-06 ENCOUNTER — Ambulatory Visit: Payer: 59 | Admitting: Registered Nurse

## 2021-12-17 ENCOUNTER — Other Ambulatory Visit: Payer: Self-pay | Admitting: Registered Nurse

## 2021-12-25 ENCOUNTER — Ambulatory Visit: Payer: 59 | Admitting: Registered Nurse

## 2021-12-29 ENCOUNTER — Other Ambulatory Visit: Payer: Self-pay | Admitting: Registered Nurse

## 2021-12-29 ENCOUNTER — Ambulatory Visit (INDEPENDENT_AMBULATORY_CARE_PROVIDER_SITE_OTHER): Payer: Managed Care, Other (non HMO) | Admitting: Registered Nurse

## 2021-12-29 ENCOUNTER — Encounter: Payer: Self-pay | Admitting: Registered Nurse

## 2021-12-29 VITALS — BP 102/76 | HR 67 | Temp 98.2°F | Resp 14 | Ht 61.0 in | Wt 181.1 lb

## 2021-12-29 DIAGNOSIS — E785 Hyperlipidemia, unspecified: Secondary | ICD-10-CM

## 2021-12-29 DIAGNOSIS — J301 Allergic rhinitis due to pollen: Secondary | ICD-10-CM | POA: Diagnosis not present

## 2021-12-29 DIAGNOSIS — E66811 Obesity, class 1: Secondary | ICD-10-CM

## 2021-12-29 DIAGNOSIS — G479 Sleep disorder, unspecified: Secondary | ICD-10-CM

## 2021-12-29 DIAGNOSIS — R7303 Prediabetes: Secondary | ICD-10-CM | POA: Diagnosis not present

## 2021-12-29 DIAGNOSIS — E669 Obesity, unspecified: Secondary | ICD-10-CM | POA: Diagnosis not present

## 2021-12-29 LAB — COMPREHENSIVE METABOLIC PANEL
ALT: 22 U/L (ref 0–35)
AST: 14 U/L (ref 0–37)
Albumin: 4.5 g/dL (ref 3.5–5.2)
Alkaline Phosphatase: 91 U/L (ref 39–117)
BUN: 14 mg/dL (ref 6–23)
CO2: 30 mEq/L (ref 19–32)
Calcium: 9.3 mg/dL (ref 8.4–10.5)
Chloride: 103 mEq/L (ref 96–112)
Creatinine, Ser: 0.66 mg/dL (ref 0.40–1.20)
GFR: 96.31 mL/min (ref >60.00)
Glucose, Bld: 91 mg/dL (ref 70–99)
Potassium: 4.1 mEq/L (ref 3.5–5.1)
Sodium: 138 mEq/L (ref 135–145)
Total Bilirubin: 0.5 mg/dL (ref 0.2–1.2)
Total Protein: 7.5 g/dL (ref 6.0–8.3)

## 2021-12-29 LAB — LIPID PANEL
Cholesterol: 168 mg/dL (ref 0–200)
HDL: 45.3 mg/dL (ref >39.00)
NonHDL: 123.13
Total CHOL/HDL Ratio: 4
Triglycerides: 244 mg/dL — ABNORMAL HIGH (ref 0.0–149.0)
VLDL: 48.8 mg/dL — ABNORMAL HIGH (ref 0.0–40.0)

## 2021-12-29 LAB — CBC WITH DIFFERENTIAL/PLATELET
Basophils Absolute: 0 10*3/uL (ref 0.0–0.1)
Basophils Relative: 0.4 % (ref 0.0–3.0)
Eosinophils Absolute: 0.2 10*3/uL (ref 0.0–0.7)
Eosinophils Relative: 2.5 % (ref 0.0–5.0)
HCT: 43.9 % (ref 36.0–46.0)
Hemoglobin: 14.5 g/dL (ref 12.0–15.0)
Lymphocytes Relative: 24.6 % (ref 12.0–46.0)
Lymphs Abs: 2.4 10*3/uL (ref 0.7–4.0)
MCHC: 33 g/dL (ref 30.0–36.0)
MCV: 88 fl (ref 78.0–100.0)
Monocytes Absolute: 0.8 10*3/uL (ref 0.1–1.0)
Monocytes Relative: 8.1 % (ref 3.0–12.0)
Neutro Abs: 6.3 10*3/uL (ref 1.4–7.7)
Neutrophils Relative %: 64.4 % (ref 43.0–77.0)
Platelets: 301 10*3/uL (ref 150.0–400.0)
RBC: 4.99 Mil/uL (ref 3.87–5.11)
RDW: 13.5 % (ref 11.5–15.5)
WBC: 9.9 10*3/uL (ref 4.0–10.5)

## 2021-12-29 LAB — TSH: TSH: 1 u[IU]/mL (ref 0.35–5.50)

## 2021-12-29 LAB — HEMOGLOBIN A1C: Hgb A1c MFr Bld: 6.6 % — ABNORMAL HIGH (ref 4.6–6.5)

## 2021-12-29 LAB — LDL CHOLESTEROL, DIRECT: Direct LDL: 102 mg/dL

## 2021-12-29 MED ORDER — PHENTERMINE HCL 37.5 MG PO CAPS: 37.5000 mg | ORAL_CAPSULE | ORAL | 0 refills | Status: DC

## 2021-12-29 MED ORDER — MONTELUKAST SODIUM 10 MG PO TABS: 10.0000 mg | ORAL_TABLET | Freq: Every day | ORAL | 3 refills | Status: DC

## 2021-12-29 MED ORDER — AZELASTINE HCL 0.1 % NA SOLN: 1.0000 | Freq: Two times a day (BID) | NASAL | 12 refills | Status: DC

## 2021-12-29 MED ORDER — LEVOCETIRIZINE DIHYDROCHLORIDE 5 MG PO TABS: 5.0000 mg | ORAL_TABLET | Freq: Every morning | ORAL | 3 refills | Status: DC

## 2021-12-29 NOTE — Progress Notes (Signed)
Established Patient Office Visit  Subjective:  Patient ID: Robin Small, female    DOB: 25-Jun-1961  Age: 61 y.o. MRN: [id number]  CC:  Chief Complaint  Patient presents with   Weight Loss   Medication Renewal    HPI Robin Small presents for a number of concerns  Labs Would like general labs rechecked  Weight management Has been pursuing weight loss with diet and exercise. Had been on phentermine in the past - it's helped a bit, but plateaued.  Had fallen off of this but would like to restart.  Interested in speaking with nutrition/dietician  Seasonal allergies: Has been on OTC antihistamine, singulair, and sudafed in past. Good effect but dried her out. Would like to restart on a regimen. Singulair worked well.   Past Medical History:  Diagnosis Date   Anxiety    Depression    Diverticulosis 03/2016   Mild, noted on colonoscopy   High cholesterol    History of kidney stones    Lichen sclerosus    Anus   Low blood pressure reading    OA (osteoarthritis)    OSA on CPAP    Perianal cyst    Personal history of colonic polyps 04/06/2011   PONV (postoperative nausea and vomiting)    after c section, did well after knee replacement   PPD positive    Age 40 >> reports taking medicine for one year   Pre-diabetes    per pt report   Seasonal allergies     Past Surgical History:  Procedure Laterality Date   ARTHROSCOPIC KNEE Left    CESAREAN SECTION     COLONOSCOPY W/ POLYPECTOMY  04/11/2016   ENDOMETRIAL ABLATION     GANGLION CYST EXCISION Right    HEMORROIDECTOMY     lichen sclerosus lesion excision     Anus   TOTAL KNEE ARTHROPLASTY Left 03/03/2019   Procedure: TOTAL KNEE ARTHROPLASTY;  Surgeon: Melrose Nakayama, MD;  Location: [facility];  Service: Orthopedics;  Laterality: Left;   TOTAL KNEE ARTHROPLASTY Right 09/15/2019   Procedure: RIGHT TOTAL KNEE ARTHROPLASTY;  Surgeon: Melrose Nakayama, MD;  Location: [facility];  Service: Orthopedics;  Laterality: Right;    TUBAL LIGATION      Family History  Problem Relation Age of Onset   Colon polyps Mother    Diabetes Mother    Hypertension Mother    Heart disease Mother    Hyperlipidemia Mother    Alcohol abuse Brother    Hepatitis Brother        hep c   Breast cancer Maternal Grandmother        breast   Lung cancer Father    Colon cancer Neg Hx     Social History   Socioeconomic History   Marital status: Married    Spouse name: Not on file   Number of children: Not on file   Years of education: Not on file   Highest education level: Not on file  Occupational History   Occupation: internet support  Tobacco Use   Smoking status: Some Days    Packs/day: 0.50    Years: 42.00    Pack years: 21.00    Types: Cigarettes    Last attempt to quit: 07/31/2019    Years since quitting: 2.4   Smokeless tobacco: Never   Tobacco comments:    1 pack per week on and off  Vaping Use   Vaping Use: Never used  Substance and Sexual Activity   Alcohol  use: Yes    Alcohol/week: 0.0 standard drinks    Comment: rare   Drug use: No   Sexual activity: Yes    Partners: Male    Birth control/protection: Post-menopausal, Surgical  Other Topics Concern   Not on file  Social History Narrative   Exercise-- daily for 1 hour   Social Determinants of Health   Financial Resource Strain: Not on file  Food Insecurity: Not on file  Transportation Needs: Not on file  Physical Activity: Not on file  Stress: Not on file  Social Connections: Not on file  Intimate Partner Violence: Not on file    Outpatient Medications Prior to Visit  Medication Sig Dispense Refill   acetaminophen (TYLENOL) 500 MG tablet Take 1,000 mg by mouth every 6 (six) hours as needed for moderate pain.     albuterol (PROAIR HFA) 108 (90 Base) MCG/ACT inhaler INHALE 2 PUFFS INTO THE LUNGS EVERY 6 HOURS AS NEEDED FOR WHEEZING OR SHORTNESS OF BREATH 8.5 g 0   atorvastatin (LIPITOR) 20 MG tablet Take 1 tablet (20 mg total) by mouth daily.  90 tablet 3   Calcium Carbonate-Vitamin D (CALCIUM-D) 600-400 MG-UNIT TABS Take 2 tablets by mouth at bedtime.      clobetasol cream (TEMOVATE) 8.67 % Apply 1 application topically 2 (two) times daily as needed (skin irritation).     diclofenac (VOLTAREN) 75 MG EC tablet TAKE 1 TABLET(75 MG) BY MOUTH TWICE DAILY 60 tablet 0   fluticasone (FLONASE) 50 MCG/ACT nasal spray Place 2 sprays into both nostrils daily. 16 g 0   Multiple Vitamins-Minerals (AIRBORNE PO) Take 2 tablets by mouth daily.     tiZANidine (ZANAFLEX) 4 MG capsule Take 1 capsule (4 mg total) by mouth 3 (three) times daily as needed for muscle spasms. 90 capsule 0   zolpidem (AMBIEN) 10 MG tablet TAKE 1 TABLET(10 MG) BY MOUTH AT BEDTIME 30 tablet 0   aspirin 81 MG chewable tablet Chew 1 tablet (81 mg total) by mouth 2 (two) times daily. 30 tablet 0   HYDROcodone-acetaminophen (NORCO/VICODIN) 5-325 MG tablet Take 1-2 tablets by mouth every 4 (four) hours as needed for moderate pain (pain score 4-6). 30 tablet 0   levocetirizine (XYZAL) 5 MG tablet TAKE 1 TABLET(5 MG) BY MOUTH EVERY EVENING 30 tablet 0   methylPREDNISolone (MEDROL DOSEPAK) 4 MG TBPK tablet Take 24 mg on day 1, 20 mg on day 2, 16 mg on day 3, 12 mg on day 4, 8 mg on day 5, 4 mg on day 6. 21 tablet 0   montelukast (SINGULAIR) 10 MG tablet TAKE 1 TABLET(10 MG) BY MOUTH AT BEDTIME (Patient not taking: Reported on 12/29/2021) 90 tablet 1   Polyethyl Glyc-Propyl Glyc PF (SYSTANE PRESERVATIVE FREE) 0.4-0.3 % SOLN Instill 1-2 drops in each eye as needed. (Patient not taking: Reported on 12/29/2021) 28 each 0   traMADol (ULTRAM) 50 MG tablet TAKE 1 TABLET(50 MG) BY MOUTH EVERY 8 HOURS FOR UP TO 5 DAYS AS NEEDED (Patient not taking: Reported on 12/29/2021) 15 tablet 0   traZODone (DESYREL) 50 MG tablet TAKE 1/2 TO 1 TABLET(25 TO 50 MG) BY MOUTH AT BEDTIME AS NEEDED FOR SLEEP (Patient not taking: Reported on 12/29/2021) 90 tablet 0   varenicline (CHANTIX STARTING MONTH PAK) 0.5 MG X 11 &  1 MG X 42 tablet Take one 0.5 mg tablet by mouth once daily for 3 days, then increase to one 0.5 mg tablet twice daily for 4 days, then increase to  one 1 mg tablet twice daily. 53 tablet 0   varenicline (CHANTIX) 1 MG tablet Take 1 tablet (1 mg total) by mouth 2 (two) times daily. (Patient not taking: Reported on 12/29/2021) 60 tablet 1   No facility-administered medications prior to visit.    Allergies  Allergen Reactions   Codeine Nausea Only    ROS Review of Systems  Constitutional: Negative.   HENT: Negative.    Eyes: Negative.   Respiratory: Negative.    Cardiovascular: Negative.   Gastrointestinal: Negative.   Endocrine: Negative.   Genitourinary: Negative.   Musculoskeletal: Negative.   Skin: Negative.   Allergic/Immunologic: Positive for environmental allergies. Negative for food allergies and immunocompromised state.  Neurological: Negative.   Psychiatric/Behavioral: Negative.    All other systems reviewed and are negative.    Objective:    Physical Exam Vitals and nursing note reviewed.  Constitutional:      General: She is not in acute distress.    Appearance: Normal appearance. She is obese. She is not ill-appearing, toxic-appearing or diaphoretic.  Cardiovascular:     Rate and Rhythm: Normal rate and regular rhythm.     Heart sounds: Normal heart sounds. No murmur heard.   No friction rub. No gallop.  Pulmonary:     Effort: Pulmonary effort is normal. No respiratory distress.     Breath sounds: Normal breath sounds. No stridor. No wheezing, rhonchi or rales.  Chest:     Chest wall: No tenderness.  Skin:    General: Skin is warm and dry.  Neurological:     General: No focal deficit present.     Mental Status: She is alert and oriented to person, place, and time. Mental status is at baseline.  Psychiatric:        Mood and Affect: Mood normal.        Behavior: Behavior normal.        Thought Content: Thought content normal.        Judgment: Judgment  normal.    BP 102/76    Pulse 67    Temp 98.2 F (36.8 C) (Temporal)    Resp 14    Ht 5\' 1"  (1.549 m)    Wt 181 lb 1.6 oz (82.1 kg)    SpO2 98%    BMI 34.22 kg/m  Wt Readings from Last 3 Encounters:  12/29/21 181 lb 1.6 oz (82.1 kg)  11/18/21 175 lb (79.4 kg)  09/22/21 182 lb (82.6 kg)     Health Maintenance Due  Topic Date Due   PAP SMEAR-Modifier  05/02/2018    There are no preventive care reminders to display for this patient.  Lab Results  Component Value Date   TSH 1.180 10/03/2020   Lab Results  Component Value Date   WBC 12.0 (H) 11/18/2021   HGB 15.2 (H) 11/18/2021   HCT 45.4 11/18/2021   MCV 88.2 11/18/2021   PLT 287 11/18/2021   Lab Results  Component Value Date   NA 138 11/18/2021   K 3.7 11/18/2021   CO2 25 11/18/2021   GLUCOSE 103 (H) 11/18/2021   BUN 15 11/18/2021   CREATININE 0.78 11/18/2021   BILITOT 0.5 11/18/2021   ALKPHOS 76 11/18/2021   AST 15 11/18/2021   ALT 18 11/18/2021   PROT 7.3 11/18/2021   ALBUMIN 3.9 11/18/2021   CALCIUM 9.4 11/18/2021   ANIONGAP 9 11/18/2021   GFR 119.02 08/24/2013   Lab Results  Component Value Date   CHOL 179 10/03/2020  Lab Results  Component Value Date   HDL 42 10/03/2020   Lab Results  Component Value Date   LDLCALC 92 10/03/2020   Lab Results  Component Value Date   TRIG 266 (H) 10/03/2020   Lab Results  Component Value Date   CHOLHDL 4.3 10/03/2020   Lab Results  Component Value Date   HGBA1C 6.7 (H) 10/03/2020      Assessment & Plan:   Problem List Items Addressed This Visit   None Visit Diagnoses     Obesity (BMI 30.0-34.9)    -  Primary   Relevant Medications   phentermine 37.5 MG capsule   Other Relevant Orders   CBC with Differential/Platelet   TSH   Seasonal allergic rhinitis due to pollen       Relevant Medications   montelukast (SINGULAIR) 10 MG tablet   levocetirizine (XYZAL) 5 MG tablet   azelastine (ASTELIN) 0.1 % nasal spray   Prediabetes       Relevant  Orders   CBC with Differential/Platelet   Comprehensive metabolic panel   Hemoglobin A1c   TSH   Dyslipidemia       Relevant Orders   CBC with Differential/Platelet   Lipid panel       Meds ordered this encounter  Medications   phentermine 37.5 MG capsule    Sig: Take 1 capsule (37.5 mg total) by mouth every morning.    Dispense:  90 capsule    Refill:  0    Order Specific Question:   Supervising Provider    Answer:   Carlota Raspberry, JEFFREY R [[id number]]   montelukast (SINGULAIR) 10 MG tablet    Sig: Take 1 tablet (10 mg total) by mouth at bedtime.    Dispense:  90 tablet    Refill:  3    Order Specific Question:   Supervising Provider    Answer:   Carlota Raspberry, JEFFREY R [[id number]]   levocetirizine (XYZAL) 5 MG tablet    Sig: Take 1 tablet (5 mg total) by mouth every morning.    Dispense:  90 tablet    Refill:  3    Order Specific Question:   Supervising Provider    Answer:   Carlota Raspberry, JEFFREY R [[id number]]   azelastine (ASTELIN) 0.1 % nasal spray    Sig: Place 1 spray into both nostrils 2 (two) times daily. Use in each nostril as directed    Dispense:  30 mL    Refill:  12    Order Specific Question:   Supervising Provider    Answer:   Carlota Raspberry, JEFFREY R [[id number]]    Follow-up: No follow-ups on file.   PLAN Restart phenetermine. Recheck weight and effect at CPE in March. Refer to nutrition Restart singulair. Add otc anthistamine. Azelastine nasal spray. Reviewed risks, benefits, AE. Pt voices understanding. Labs collected. Will follow up with the patient as warranted. Patient encouraged to call clinic with any questions, comments, or concerns.   Maximiano Coss, NP

## 2021-12-29 NOTE — Patient Instructions (Signed)
[name] to see you!  Restart phentermine  Labs today will be back this afternoon  I have sent singulair  Thank you  Rich

## 2022-01-25 ENCOUNTER — Other Ambulatory Visit: Payer: Self-pay | Admitting: Registered Nurse

## 2022-02-20 ENCOUNTER — Other Ambulatory Visit: Payer: Self-pay | Admitting: Registered Nurse

## 2022-02-20 DIAGNOSIS — G479 Sleep disorder, unspecified: Secondary | ICD-10-CM

## 2022-02-23 ENCOUNTER — Encounter: Payer: Managed Care, Other (non HMO) | Admitting: Registered Nurse

## 2022-02-25 ENCOUNTER — Other Ambulatory Visit: Payer: Self-pay | Admitting: Registered Nurse

## 2022-02-26 NOTE — Telephone Encounter (Signed)
Patient is requesting a refill of the following medications: ?Requested Prescriptions  ? ?Pending Prescriptions Disp Refills  ? diclofenac (VOLTAREN) 75 MG EC tablet [Pharmacy Med Name: DICLOFENAC SODIUM '75MG'$  DR TABLETS] 60 tablet 0  ?  Sig: TAKE 1 TABLET(75 MG) BY MOUTH TWICE DAILY  ? ? ?Date of patient request: 02/25/2022 ?Last office visit: 12/29/2021 ?Date of last refill: 01/25/2022 ?Last refill amount: 60 tablets  ?Follow up time period per chart: 04/12/2022 ? ?

## 2022-03-09 ENCOUNTER — Encounter: Payer: Managed Care, Other (non HMO) | Admitting: Registered Nurse

## 2022-04-04 ENCOUNTER — Other Ambulatory Visit: Payer: Self-pay | Admitting: Registered Nurse

## 2022-04-09 ENCOUNTER — Other Ambulatory Visit: Payer: Self-pay | Admitting: Registered Nurse

## 2022-04-09 DIAGNOSIS — G479 Sleep disorder, unspecified: Secondary | ICD-10-CM

## 2022-04-12 ENCOUNTER — Encounter: Payer: Managed Care, Other (non HMO) | Admitting: Registered Nurse

## 2022-04-16 ENCOUNTER — Encounter: Payer: Managed Care, Other (non HMO) | Admitting: Registered Nurse

## 2022-05-04 ENCOUNTER — Encounter: Payer: Managed Care, Other (non HMO) | Admitting: Registered Nurse

## 2022-05-06 ENCOUNTER — Other Ambulatory Visit: Payer: Self-pay | Admitting: Registered Nurse

## 2022-05-24 ENCOUNTER — Ambulatory Visit (INDEPENDENT_AMBULATORY_CARE_PROVIDER_SITE_OTHER): Payer: Commercial Managed Care - HMO | Admitting: Registered Nurse

## 2022-05-24 ENCOUNTER — Encounter: Payer: Self-pay | Admitting: Registered Nurse

## 2022-05-24 VITALS — BP 110/66 | HR 70 | Temp 98.3°F | Resp 18 | Ht 61.0 in | Wt 171.5 lb

## 2022-05-24 DIAGNOSIS — E669 Obesity, unspecified: Secondary | ICD-10-CM | POA: Insufficient documentation

## 2022-05-24 DIAGNOSIS — E785 Hyperlipidemia, unspecified: Secondary | ICD-10-CM

## 2022-05-24 DIAGNOSIS — Z1329 Encounter for screening for other suspected endocrine disorder: Secondary | ICD-10-CM | POA: Diagnosis not present

## 2022-05-24 DIAGNOSIS — Z130 Encounter for screening for diseases of the blood and blood-forming organs and certain disorders involving the immune mechanism: Secondary | ICD-10-CM

## 2022-05-24 DIAGNOSIS — M26609 Unspecified temporomandibular joint disorder, unspecified side: Secondary | ICD-10-CM | POA: Insufficient documentation

## 2022-05-24 DIAGNOSIS — E66811 Obesity, class 1: Secondary | ICD-10-CM

## 2022-05-24 DIAGNOSIS — G479 Sleep disorder, unspecified: Secondary | ICD-10-CM

## 2022-05-24 DIAGNOSIS — Z13228 Encounter for screening for other metabolic disorders: Secondary | ICD-10-CM

## 2022-05-24 DIAGNOSIS — J301 Allergic rhinitis due to pollen: Secondary | ICD-10-CM

## 2022-05-24 DIAGNOSIS — L900 Lichen sclerosus et atrophicus: Secondary | ICD-10-CM | POA: Insufficient documentation

## 2022-05-24 DIAGNOSIS — Z0000 Encounter for general adult medical examination without abnormal findings: Secondary | ICD-10-CM

## 2022-05-24 DIAGNOSIS — Z13220 Encounter for screening for lipoid disorders: Secondary | ICD-10-CM | POA: Diagnosis not present

## 2022-05-24 DIAGNOSIS — Z6833 Body mass index (BMI) 33.0-33.9, adult: Secondary | ICD-10-CM | POA: Insufficient documentation

## 2022-05-24 LAB — LIPID PANEL
Cholesterol: 160 mg/dL (ref 0–200)
HDL: 41.8 mg/dL (ref >39.00)
LDL Cholesterol: 84 mg/dL (ref 0–99)
NonHDL: 118.5
Total CHOL/HDL Ratio: 4
Triglycerides: 175 mg/dL — ABNORMAL HIGH (ref 0.0–149.0)
VLDL: 35 mg/dL (ref 0.0–40.0)

## 2022-05-24 LAB — COMPREHENSIVE METABOLIC PANEL
ALT: 23 U/L (ref 0–35)
AST: 17 U/L (ref 0–37)
Albumin: 4.2 g/dL (ref 3.5–5.2)
Alkaline Phosphatase: 80 U/L (ref 39–117)
BUN: 14 mg/dL (ref 6–23)
CO2: 28 mEq/L (ref 19–32)
Calcium: 9.3 mg/dL (ref 8.4–10.5)
Chloride: 103 mEq/L (ref 96–112)
Creatinine, Ser: 0.69 mg/dL (ref 0.40–1.20)
GFR: 95.02 mL/min (ref >60.00)
Glucose, Bld: 92 mg/dL (ref 70–99)
Potassium: 4 mEq/L (ref 3.5–5.1)
Sodium: 139 mEq/L (ref 135–145)
Total Bilirubin: 0.7 mg/dL (ref 0.2–1.2)
Total Protein: 7.1 g/dL (ref 6.0–8.3)

## 2022-05-24 LAB — CBC WITH DIFFERENTIAL/PLATELET
Basophils Absolute: 0.1 10*3/uL (ref 0.0–0.1)
Basophils Relative: 1.2 % (ref 0.0–3.0)
Eosinophils Absolute: 0.2 10*3/uL (ref 0.0–0.7)
Eosinophils Relative: 2.5 % (ref 0.0–5.0)
HCT: 45 % (ref 36.0–46.0)
Hemoglobin: 14.7 g/dL (ref 12.0–15.0)
Lymphocytes Relative: 31.1 % (ref 12.0–46.0)
Lymphs Abs: 2.4 10*3/uL (ref 0.7–4.0)
MCHC: 32.7 g/dL (ref 30.0–36.0)
MCV: 90.2 fl (ref 78.0–100.0)
Monocytes Absolute: 0.6 10*3/uL (ref 0.1–1.0)
Monocytes Relative: 7.6 % (ref 3.0–12.0)
Neutro Abs: 4.5 10*3/uL (ref 1.4–7.7)
Neutrophils Relative %: 57.6 % (ref 43.0–77.0)
Platelets: 283 10*3/uL (ref 150.0–400.0)
RBC: 4.99 Mil/uL (ref 3.87–5.11)
RDW: 13.5 % (ref 11.5–15.5)
WBC: 7.7 10*3/uL (ref 4.0–10.5)

## 2022-05-24 LAB — HEMOGLOBIN A1C: Hgb A1c MFr Bld: 6.5 % (ref 4.6–6.5)

## 2022-05-24 LAB — TSH: TSH: 1.66 u[IU]/mL (ref 0.35–5.50)

## 2022-05-24 MED ORDER — ATORVASTATIN CALCIUM 20 MG PO TABS: 20.0000 mg | ORAL_TABLET | Freq: Every day | ORAL | 3 refills | Status: DC

## 2022-05-24 MED ORDER — PHENTERMINE HCL 37.5 MG PO CAPS: 37.5000 mg | ORAL_CAPSULE | ORAL | 0 refills | Status: DC

## 2022-05-24 MED ORDER — LEVOCETIRIZINE DIHYDROCHLORIDE 5 MG PO TABS: 5.0000 mg | ORAL_TABLET | Freq: Every morning | ORAL | 3 refills | Status: DC

## 2022-05-24 MED ORDER — ZOLPIDEM TARTRATE 10 MG PO TABS: 10.0000 mg | ORAL_TABLET | Freq: Every evening | ORAL | 0 refills | Status: DC | PRN

## 2022-05-24 MED ORDER — MONTELUKAST SODIUM 10 MG PO TABS: 10.0000 mg | ORAL_TABLET | Freq: Every day | ORAL | 3 refills | Status: DC

## 2022-05-24 NOTE — Assessment & Plan Note (Signed)
Well controlled with current regimen conitnue

## 2022-05-24 NOTE — Patient Instructions (Addendum)
[name] to see you  I'll call with lab concerns if there are any  Keep staying active, stretch daily. Let me know if you'd like to seek physical therapy  See you in 6 mo for med check on ambien  See you in 1 year for physical and labs  Thanks,  Rich     If you have lab work done today you will be contacted with your lab results within the next 2 weeks.  If you have not heard from [place] then please contact us. The fastest way to get your results is to register for My Chart.   IF you received an x-ray today, you will receive an invoice from [facility]. Please contact [facility] at [phone] with questions or concerns regarding your invoice.   IF you received labwork today, you will receive an invoice from [facility]. Please contact [facility] at [phone] with questions or concerns regarding your invoice.   Our billing staff will not be able to assist you with questions regarding bills from these companies.  You will be contacted with the lab results as soon as they are available. The fastest way to get your results is to activate your My Chart account. Instructions are located on the last page of this paperwork. If you have not heard from [place] regarding the results in 2 weeks, please contact this office.

## 2022-05-24 NOTE — Progress Notes (Signed)
Complete physical exam  Patient: Robin Small   DOB: 06-03-1961   61 y.o. Female  MRN: [id number] Visit Date: 05/24/2022  Subjective:    Chief Complaint  Patient presents with   Annual Exam    Patient states she is here for a Cpe and labs    Robin Small is a 61 y.o. female who presents today for a complete physical exam. She reports consuming a general diet. The patient has a physically strenuous job, but has no regular exercise apart from work.  She generally feels well. She reports sleeping well. She does have additional problems to discuss today.   Obesity Doing well with phentermine. Takes most days Has been on break for 2 weeks Would like to resume Down more than 10lbs  Insomnia Stable with ambien Hopes to continue. No AE. Reviewed risks, benefits, and side effects, pt voices understanding.  Seasonal allergies Stable on current regimen Hopes to continue.  Vision:Within the last year Dental:Within Last 6 months STD Screen:No PSA:No Most recent fall risk assessment:    05/24/2022    9:20 AM  [place] in the past year? 0  Number falls in past yr: 0  Injury with Fall? 0  Risk for fall due to : No Fall Risks  Follow up Falls evaluation completed     Most recent depression screenings:    05/24/2022    9:21 AM 12/29/2021   10:31 AM  PHQ 2/9 Scores  PHQ - 2 Score 0 1  PHQ- 9 Score 0 9     Patient Active Problem List   Diagnosis Date Noted   Lichen sclerosus 93/23/5573   TMJ (temporomandibular joint syndrome) 05/24/2022   Seasonal allergic rhinitis due to pollen 05/24/2022   Obesity (BMI 30.0-34.9) 05/24/2022   Sleep disturbance 05/24/2022   Primary osteoarthritis of right knee 09/15/2019   Primary osteoarthritis of left knee 03/03/2019   Seasonal and perennial allergic rhinitis 02/13/2019   S/P hemorrhoidectomy 02/01/2017   Nausea with vomiting, unspecified 01/24/2017   Sinusitis, acute 08/24/2013   Vesicular rash 08/24/2013   Dyspnea  01/08/2013   Sleep apnea 01/08/2013   Muscle ache 02/08/2012   Personal history of colonic polyps 04/06/2011   EXTERNAL HEMORRHOIDS 12/27/2010   Mild intermittent asthma 06/03/2010   LUMBAR SPRAIN AND STRAIN 07/20/2008   ANXIETY DEPRESSION 06/28/2008   Dyslipidemia 07/10/2007   DEPRESSION 05/29/2007   Past Medical History:  Diagnosis Date   Anxiety    Depression    Diverticulosis 03/2016   Mild, noted on colonoscopy   High cholesterol    History of kidney stones    Lichen sclerosus    Anus   Low blood pressure reading    OA (osteoarthritis)    OSA on CPAP    Perianal cyst    Personal history of colonic polyps 04/06/2011   PONV (postoperative nausea and vomiting)    after c section, did well after knee replacement   PPD positive    Age 73 >> reports taking medicine for one year   Pre-diabetes    per pt report   Seasonal allergies    Past Surgical History:  Procedure Laterality Date   ARTHROSCOPIC KNEE Left    CESAREAN SECTION     COLONOSCOPY W/ POLYPECTOMY  04/11/2016   ENDOMETRIAL ABLATION     GANGLION CYST EXCISION Right    HEMORROIDECTOMY     lichen sclerosus lesion excision     Anus   TOTAL KNEE ARTHROPLASTY Left  03/03/2019   Procedure: TOTAL KNEE ARTHROPLASTY;  Surgeon: Melrose Nakayama, MD;  Location: [facility];  Service: Orthopedics;  Laterality: Left;   TOTAL KNEE ARTHROPLASTY Right 09/15/2019   Procedure: RIGHT TOTAL KNEE ARTHROPLASTY;  Surgeon: Melrose Nakayama, MD;  Location: [facility];  Service: Orthopedics;  Laterality: Right;   TUBAL LIGATION     Social History   Tobacco Use   Smoking status: Some Days    Packs/day: 0.50    Years: 42.00    Total pack years: 21.00    Types: Cigarettes    Last attempt to quit: 07/31/2019    Years since quitting: 2.8   Smokeless tobacco: Never   Tobacco comments:    1 pack per week on and off  Vaping Use   Vaping Use: Never used  Substance Use Topics   Alcohol use: Yes    Alcohol/week: 0.0 standard drinks of alcohol     Comment: rare   Drug use: No   Social History   Socioeconomic History   Marital status: Married    Spouse name: Not on file   Number of children: Not on file   Years of education: Not on file   Highest education level: Not on file  Occupational History   Occupation: internet support  Tobacco Use   Smoking status: Some Days    Packs/day: 0.50    Years: 42.00    Total pack years: 21.00    Types: Cigarettes    Last attempt to quit: 07/31/2019    Years since quitting: 2.8   Smokeless tobacco: Never   Tobacco comments:    1 pack per week on and off  Vaping Use   Vaping Use: Never used  Substance and Sexual Activity   Alcohol use: Yes    Alcohol/week: 0.0 standard drinks of alcohol    Comment: rare   Drug use: No   Sexual activity: Yes    Partners: Male    Birth control/protection: Post-menopausal, Surgical  Other Topics Concern   Not on file  Social History Narrative   Exercise-- daily for 1 hour   Social Determinants of Health   Financial Resource Strain: Not on file  Food Insecurity: Not on file  Transportation Needs: Not on file  Physical Activity: Not on file  Stress: Not on file  Social Connections: Not on file  Intimate Partner Violence: Not on file   Family Status  Relation Name Status   Mother  Alive   Brother  Alive   MGM  Deceased   Father  Alive   Sister  Alive   Neg Hx  (Not Specified)   Family History  Problem Relation Age of Onset   Colon polyps Mother    Diabetes Mother    Hypertension Mother    Heart disease Mother    Hyperlipidemia Mother    Alcohol abuse Brother    Hepatitis Brother        hep c   Breast cancer Maternal Grandmother        breast   Lung cancer Father    Colon cancer Neg Hx    Allergies  Allergen Reactions   Codeine Nausea Only     Patient Care Team: Maximiano Coss, NP as PCP - General (Adult Health Nurse Practitioner) Chesley Mires, MD as Attending Physician (Pulmonary Disease)   Medications: Outpatient  Medications Prior to Visit  Medication Sig   acetaminophen (TYLENOL) 500 MG tablet Take 1,000 mg by mouth every 6 (six) hours as needed for moderate  pain.   albuterol (PROAIR HFA) 108 (90 Base) MCG/ACT inhaler INHALE 2 PUFFS INTO THE LUNGS EVERY 6 HOURS AS NEEDED FOR WHEEZING OR SHORTNESS OF BREATH   azelastine (ASTELIN) 0.1 % nasal spray Place 1 spray into both nostrils 2 (two) times daily. Use in each nostril as directed   Calcium Carbonate-Vitamin D (CALCIUM-D) 600-400 MG-UNIT TABS Take 2 tablets by mouth at bedtime.    clobetasol cream (TEMOVATE) 6.43 % Apply 1 application topically 2 (two) times daily as needed (skin irritation).   diclofenac (VOLTAREN) 75 MG EC tablet TAKE 1 TABLET(75 MG) BY MOUTH TWICE DAILY   fluticasone (FLONASE) 50 MCG/ACT nasal spray Place 2 sprays into both nostrils daily.   Multiple Vitamins-Minerals (AIRBORNE PO) Take 2 tablets by mouth daily.   tiZANidine (ZANAFLEX) 4 MG capsule Take 1 capsule (4 mg total) by mouth 3 (three) times daily as needed for muscle spasms.   [DISCONTINUED] atorvastatin (LIPITOR) 20 MG tablet Take 1 tablet (20 mg total) by mouth daily.   [DISCONTINUED] levocetirizine (XYZAL) 5 MG tablet Take 1 tablet (5 mg total) by mouth every morning.   [DISCONTINUED] montelukast (SINGULAIR) 10 MG tablet Take 1 tablet (10 mg total) by mouth at bedtime.   [DISCONTINUED] phentermine 37.5 MG capsule Take 1 capsule (37.5 mg total) by mouth every morning.   [DISCONTINUED] zolpidem (AMBIEN) 10 MG tablet TAKE 1 TABLET(10 MG) BY MOUTH AT BEDTIME   No facility-administered medications prior to visit.    Review of Systems  Constitutional: Negative.   HENT: Negative.    Eyes: Negative.   Respiratory: Negative.    Cardiovascular: Negative.   Gastrointestinal: Negative.   Genitourinary: Negative.   Musculoskeletal: Negative.   Skin: Negative.   Neurological: Negative.   Psychiatric/Behavioral: Negative.    All other systems reviewed and are  negative.   Last CBC Lab Results  Component Value Date   WBC 9.9 12/29/2021   HGB 14.5 12/29/2021   HCT 43.9 12/29/2021   MCV 88.0 12/29/2021   MCH 29.5 11/18/2021   RDW 13.5 12/29/2021   PLT 301.0 32/95/1884   Last metabolic panel Lab Results  Component Value Date   GLUCOSE 91 12/29/2021   NA 138 12/29/2021   K 4.1 12/29/2021   CL 103 12/29/2021   CO2 30 12/29/2021   BUN 14 12/29/2021   CREATININE 0.66 12/29/2021   GFRNONAA >60 11/18/2021   CALCIUM 9.3 12/29/2021   PROT 7.5 12/29/2021   ALBUMIN 4.5 12/29/2021   LABGLOB 2.5 10/03/2020   AGRATIO 1.8 10/03/2020   BILITOT 0.5 12/29/2021   ALKPHOS 91 12/29/2021   AST 14 12/29/2021   ALT 22 12/29/2021   ANIONGAP 9 11/18/2021   Last lipids Lab Results  Component Value Date   CHOL 168 12/29/2021   HDL 45.30 12/29/2021   LDLCALC 92 10/03/2020   LDLDIRECT 102.0 12/29/2021   TRIG 244.0 (H) 12/29/2021   CHOLHDL 4 12/29/2021   Last hemoglobin A1c Lab Results  Component Value Date   HGBA1C 6.6 (H) 12/29/2021   Last thyroid functions Lab Results  Component Value Date   TSH 1.00 12/29/2021   Last vitamin D No results found for: "25OHVITD2", "25OHVITD3", "VD25OH" Last vitamin B12 and Folate No results found for: "VITAMINB12", "FOLATE"      Objective:     BP 110/66   Pulse 70   Temp 98.3 F (36.8 C) (Temporal)   Resp 18   Ht '5\' 1"'$  (1.549 m)   Wt 171 lb 8 oz (77.8 kg)  SpO2 98%   BMI 32.40 kg/m   BP Readings from Last 3 Encounters:  05/24/22 110/66  12/29/21 102/76  11/18/21 111/64   Wt Readings from Last 3 Encounters:  05/24/22 171 lb 8 oz (77.8 kg)  12/29/21 181 lb 1.6 oz (82.1 kg)  11/18/21 175 lb (79.4 kg)   SpO2 Readings from Last 3 Encounters:  05/24/22 98%  12/29/21 98%  11/18/21 96%      Physical Exam Vitals and nursing note reviewed.  Constitutional:      General: She is not in acute distress.    Appearance: Normal appearance. She is not ill-appearing, toxic-appearing or  diaphoretic.  HENT:     Head: Normocephalic and atraumatic.     Right Ear: Tympanic membrane, ear canal and external ear normal. There is no impacted cerumen.     Left Ear: Tympanic membrane, ear canal and external ear normal. There is no impacted cerumen.     Nose: Nose normal. No congestion or rhinorrhea.     Mouth/Throat:     Mouth: Mucous membranes are moist.     Pharynx: Oropharynx is clear. No oropharyngeal exudate or posterior oropharyngeal erythema.  Eyes:     General: No scleral icterus.       Right eye: No discharge.        Left eye: No discharge.     Extraocular Movements: Extraocular movements intact.     Conjunctiva/sclera: Conjunctivae normal.     Pupils: Pupils are equal, round, and reactive to light.  Neck:     Vascular: No carotid bruit.  Cardiovascular:     Rate and Rhythm: Normal rate and regular rhythm.     Pulses: Normal pulses.     Heart sounds: Normal heart sounds. No murmur heard.    No friction rub. No gallop.  Pulmonary:     Effort: Pulmonary effort is normal. No respiratory distress.     Breath sounds: Normal breath sounds. No stridor. No wheezing, rhonchi or rales.  Chest:     Chest wall: No tenderness.  Abdominal:     General: Abdomen is flat. Bowel sounds are normal. There is no distension.     Palpations: There is no mass.     Tenderness: There is no abdominal tenderness. There is no right CVA tenderness, left CVA tenderness, guarding or rebound.     Hernia: No hernia is present.  Musculoskeletal:        General: No swelling, tenderness, deformity or signs of injury. Normal range of motion.     Cervical back: Normal range of motion and neck supple. No rigidity or tenderness.     Right lower leg: No edema.     Left lower leg: No edema.  Lymphadenopathy:     Cervical: No cervical adenopathy.  Skin:    General: Skin is warm and dry.     Capillary Refill: Capillary refill takes less than 2 seconds.     Coloration: Skin is not jaundiced or pale.      Findings: No bruising, erythema, lesion or rash.  Neurological:     General: No focal deficit present.     Mental Status: She is alert and oriented to person, place, and time. Mental status is at baseline.     Cranial Nerves: No cranial nerve deficit.     Sensory: No sensory deficit.     Motor: No weakness.     Coordination: Coordination normal.     Gait: Gait normal.     Deep Tendon Reflexes:  Reflexes normal.  Psychiatric:        Mood and Affect: Mood normal.        Behavior: Behavior normal.        Thought Content: Thought content normal.        Judgment: Judgment normal.      No results found for any visits on 05/24/22.    Assessment & Plan:    Routine Health Maintenance and Physical Exam  Immunization History  Administered Date(s) Administered   Influenza Whole 12/11/2004, 09/20/2008, 09/16/2012   Influenza,inj,Quad PF,6+ Mos 08/24/2013, 02/13/2019, 10/03/2020   Influenza-Unspecified 08/18/2019   Moderna SARS-COV2 Booster Vaccination 09/14/2021   Moderna Sars-Covid-2 Vaccination 02/25/2020, 03/28/2020   Pneumococcal Polysaccharide-23 10/11/2004   Td 12/15/2004   Tdap 05/25/2020    Health Maintenance  Topic Date Due   PAP SMEAR-Modifier  05/24/2022 (Originally 05/02/2018)   COVID-19 Vaccine (3 - Moderna series) 06/09/2022 (Originally 11/09/2021)   Zoster Vaccines- Shingrix (1 of 2) 08/24/2022 (Originally 01/04/2013)   MAMMOGRAM  05/25/2023 (Originally 04/03/2022)   INFLUENZA VACCINE  07/17/2022   COLONOSCOPY (Pts 45-21yr Insurance coverage will need to be confirmed)  04/12/2023   TETANUS/TDAP  05/25/2030   Hepatitis C Screening  Completed   HIV Screening  Completed   HPV VACCINES  Aged Out    Discussed health benefits of physical activity, and encouraged her to engage in regular exercise appropriate for her age and condition.  Problem List Items Addressed This Visit       Respiratory   Seasonal allergic rhinitis due to pollen    Well controlled with current  regimen conitnue      Relevant Medications   levocetirizine (XYZAL) 5 MG tablet   montelukast (SINGULAIR) 10 MG tablet     Other   Dyslipidemia    Labs collected. Will follow up with the patient as warranted. Continue statin, adjust dose as indicated.       Relevant Medications   atorvastatin (LIPITOR) 20 MG tablet   Obesity (BMI 30.0-34.9)    Continue phentermine Reviewed risks, benefits, and side effects, pt voices understanding. Med check in 6 mo      Relevant Medications   phentermine 37.5 MG capsule   Sleep disturbance    Continue ambien Reviewed risks, benefits, and side effects, pt voices understanding.       Relevant Medications   zolpidem (AMBIEN) 10 MG tablet   Other Visit Diagnoses     Annual physical exam    -  Primary   Screening for endocrine, metabolic and immunity disorder       Relevant Orders   CBC with Differential/Platelet   Comprehensive metabolic panel   Hemoglobin A1c   TSH   Lipid screening       Relevant Orders   Lipid panel      Return in about 6 months (around 11/23/2022) for med check.     PLAN Exam unremarkable Labs collected. Will follow up with the patient as warranted. See problem based charting. Patient encouraged to call clinic with any questions, comments, or concerns.   RMaximiano Coss NP

## 2022-05-24 NOTE — Assessment & Plan Note (Signed)
Continue phentermine Reviewed risks, benefits, and side effects, pt voices understanding. Med check in 6 mo

## 2022-05-24 NOTE — Assessment & Plan Note (Signed)
Continue ambien Reviewed risks, benefits, and side effects, pt voices understanding.

## 2022-05-24 NOTE — Assessment & Plan Note (Signed)
Labs collected. Will follow up with the patient as warranted. Continue statin, adjust dose as indicated.

## 2022-07-11 ENCOUNTER — Other Ambulatory Visit: Payer: Self-pay | Admitting: Registered Nurse

## 2022-07-11 ENCOUNTER — Encounter: Payer: Self-pay | Admitting: Registered Nurse

## 2022-07-11 DIAGNOSIS — J301 Allergic rhinitis due to pollen: Secondary | ICD-10-CM

## 2022-07-11 DIAGNOSIS — E785 Hyperlipidemia, unspecified: Secondary | ICD-10-CM

## 2022-07-11 DIAGNOSIS — E669 Obesity, unspecified: Secondary | ICD-10-CM

## 2022-07-11 MED ORDER — TIZANIDINE HCL 4 MG PO CAPS: 4.0000 mg | ORAL_CAPSULE | Freq: Three times a day (TID) | ORAL | 0 refills | Status: DC | PRN

## 2022-07-11 MED ORDER — ATORVASTATIN CALCIUM 20 MG PO TABS: 20.0000 mg | ORAL_TABLET | Freq: Every day | ORAL | 3 refills | Status: DC

## 2022-07-11 MED ORDER — PHENTERMINE HCL 37.5 MG PO CAPS: 37.5000 mg | ORAL_CAPSULE | ORAL | 0 refills | Status: DC

## 2022-07-11 MED ORDER — LEVOCETIRIZINE DIHYDROCHLORIDE 5 MG PO TABS: 5.0000 mg | ORAL_TABLET | Freq: Every morning | ORAL | 3 refills | Status: DC

## 2022-07-11 MED ORDER — FLUTICASONE PROPIONATE 50 MCG/ACT NA SUSP: 2.0000 | Freq: Every day | NASAL | 0 refills | Status: DC

## 2022-07-11 MED ORDER — AZELASTINE HCL 0.1 % NA SOLN
1.0000 | Freq: Two times a day (BID) | NASAL | 12 refills | Status: DC
Start: 2022-07-11 — End: 2022-12-24

## 2022-07-11 MED ORDER — MONTELUKAST SODIUM 10 MG PO TABS: 10.0000 mg | ORAL_TABLET | Freq: Every day | ORAL | 3 refills | Status: DC

## 2022-07-16 ENCOUNTER — Other Ambulatory Visit: Payer: Self-pay | Admitting: Registered Nurse

## 2022-07-16 DIAGNOSIS — G479 Sleep disorder, unspecified: Secondary | ICD-10-CM

## 2022-07-16 MED ORDER — BACLOFEN 10 MG PO TABS: 10.0000 mg | ORAL_TABLET | Freq: Three times a day (TID) | ORAL | 0 refills | Status: DC

## 2022-07-17 ENCOUNTER — Telehealth: Payer: Self-pay | Admitting: Registered Nurse

## 2022-07-17 NOTE — Telephone Encounter (Signed)
Pt states she does not need or request a Rx for Baclofen . I told her to just hold that medication or return it to the pharmacy . She only wanted the Ambien .

## 2022-07-17 NOTE — Telephone Encounter (Signed)
Caller name: Ven[place] (pt)  On DPR? :yes/no: Yes  Call back number: [phone]  Provider they see: Maximiano Coss   Reason for call: Pt calling b/c she was rx'd Baclofen 10 mg and states that she has never taken that and she is concerned as to why she was rx'd this medication. Pt also states that she needs refill of zolpidem 10 mg to [facility] [id number]

## 2022-07-17 NOTE — Telephone Encounter (Signed)
Last fill 05/24/22 for #30 no refills per pdmp  Last Visit 05/24/22  [name], NP

## 2022-07-28 ENCOUNTER — Telehealth: Payer: Commercial Managed Care - HMO | Admitting: Nurse Practitioner

## 2022-07-28 DIAGNOSIS — U071 COVID-19: Secondary | ICD-10-CM | POA: Diagnosis not present

## 2022-07-28 MED ORDER — PSEUDOEPH-BROMPHEN-DM 30-2-10 MG/5ML PO SYRP: 5.0000 mL | ORAL_SOLUTION | Freq: Four times a day (QID) | ORAL | 0 refills | Status: DC | PRN

## 2022-07-28 MED ORDER — NIRMATRELVIR/RITONAVIR (PAXLOVID)TABLET: 3.0000 | ORAL_TABLET | Freq: Two times a day (BID) | ORAL | 0 refills | Status: AC

## 2022-07-28 NOTE — Patient Instructions (Signed)
Robin Small, thank you for joining Gildardo Pounds, NP for today's virtual visit.  While this provider is not your primary care provider (PCP), if your PCP is located in our provider database this encounter information will be shared with them immediately following your visit.  Consent: (Patient) Robin Small provided verbal consent for this virtual visit at the beginning of the encounter.  Current Medications:  Current Outpatient Medications:    brompheniramine-pseudoephedrine-DM 30-2-10 MG/5ML syrup, Take 5 mLs by mouth 4 (four) times daily as needed., Disp: 240 mL, Rfl: 0   nirmatrelvir/ritonavir EUA (PAXLOVID) 20 x 150 MG & 10 x '100MG'$  TABS, Take 3 tablets by mouth 2 (two) times daily for 5 days. (Take nirmatrelvir 150 mg two tablets twice daily for 5 days and ritonavir 100 mg one tablet twice daily for 5 days) Patient GFR is 95, Disp: 30 tablet, Rfl: 0   acetaminophen (TYLENOL) 500 MG tablet, Take 1,000 mg by mouth every 6 (six) hours as needed for moderate pain., Disp: , Rfl:    albuterol (PROAIR HFA) 108 (90 Base) MCG/ACT inhaler, INHALE 2 PUFFS INTO THE LUNGS EVERY 6 HOURS AS NEEDED FOR WHEEZING OR SHORTNESS OF BREATH, Disp: 8.5 g, Rfl: 0   atorvastatin (LIPITOR) 20 MG tablet, Take 1 tablet (20 mg total) by mouth daily., Disp: 90 tablet, Rfl: 3   azelastine (ASTELIN) 0.1 % nasal spray, Place 1 spray into both nostrils 2 (two) times daily. Use in each nostril as directed, Disp: 30 mL, Rfl: 12   baclofen (LIORESAL) 10 MG tablet, Take 1 tablet (10 mg total) by mouth 3 (three) times daily., Disp: 30 each, Rfl: 0   Calcium Carbonate-Vitamin D (CALCIUM-D) 600-400 MG-UNIT TABS, Take 2 tablets by mouth at bedtime. , Disp: , Rfl:    clobetasol cream (TEMOVATE) 8.67 %, Apply 1 application topically 2 (two) times daily as needed (skin irritation)., Disp: , Rfl:    diclofenac (VOLTAREN) 75 MG EC tablet, TAKE 1 TABLET(75 MG) BY MOUTH TWICE DAILY, Disp: 60 tablet, Rfl: 0   fluticasone (FLONASE) 50  MCG/ACT nasal spray, Place 2 sprays into both nostrils daily., Disp: 16 g, Rfl: 0   levocetirizine (XYZAL) 5 MG tablet, Take 1 tablet (5 mg total) by mouth every morning., Disp: 90 tablet, Rfl: 3   montelukast (SINGULAIR) 10 MG tablet, Take 1 tablet (10 mg total) by mouth at bedtime., Disp: 90 tablet, Rfl: 3   Multiple Vitamins-Minerals (AIRBORNE PO), Take 2 tablets by mouth daily., Disp: , Rfl:    phentermine 37.5 MG capsule, Take 1 capsule (37.5 mg total) by mouth every morning., Disp: 90 capsule, Rfl: 0   zolpidem (AMBIEN) 10 MG tablet, TAKE 1 TABLET(10 MG) BY MOUTH AT BEDTIME AS NEEDED FOR SLEEP, Disp: 30 tablet, Rfl: 0   Medications ordered in this encounter:  Meds ordered this encounter  Medications   brompheniramine-pseudoephedrine-DM 30-2-10 MG/5ML syrup    Sig: Take 5 mLs by mouth 4 (four) times daily as needed.    Dispense:  240 mL    Refill:  0    Order Specific Question:   Supervising Provider    Answer:   Noemi Chapel [[id number]]   nirmatrelvir/ritonavir EUA (PAXLOVID) 20 x 150 MG & 10 x '100MG'$  TABS    Sig: Take 3 tablets by mouth 2 (two) times daily for 5 days. (Take nirmatrelvir 150 mg two tablets twice daily for 5 days and ritonavir 100 mg one tablet twice daily for 5 days) Patient GFR is 95  Dispense:  30 tablet    Refill:  0    Order Specific Question:   Supervising Provider    Answer:   Sabra Heck, BRIAN [[id number]]     *If you need refills on other medications prior to your next appointment, please contact your pharmacy*  Follow-Up: Call back or seek an in-person evaluation if the symptoms worsen or if the condition fails to improve as anticipated.  Other Instructions INSTRUCTIONS: use a humidifier for nasal congestion Drink plenty of fluids, rest and wash hands frequently to avoid the spread of infection Alternate tylenol and Motrin for relief of fever    If you have been instructed to have an in-person evaluation today at a local Urgent Care facility, please use the link  below. It will take you to a list of all of our available [facility], including address, phone number and hours of operation. Please do not delay care.  [facility]  If you or a family member do not have a primary care provider, use the link below to schedule a visit and establish care. When you choose a [facility] primary care physician or advanced practice provider, you gain a long-term partner in health. Find a Primary Care Provider  Learn more about [facility] in-office and virtual care options: [facility]

## 2022-07-28 NOTE — Progress Notes (Signed)
Virtual Visit Consent   Robin Small, you are scheduled for a virtual visit with a [facility] provider today. Just as with appointments in the office, your consent must be obtained to participate. Your consent will be active for this visit and any virtual visit you may have with one of our providers in the next 365 days. If you have a MyChart account, a copy of this consent can be sent to you electronically.  As this is a virtual visit, video technology does not allow for your provider to perform a traditional examination. This may limit your provider's ability to fully assess your condition. If your provider identifies any concerns that need to be evaluated in person or the need to arrange testing (such as labs, EKG, etc.), we will make arrangements to do so. Although advances in technology are sophisticated, we cannot ensure that it will always work on either your end or our end. If the connection with a video visit is poor, the visit may have to be switched to a telephone visit. With either a video or telephone visit, we are not always able to ensure that we have a secure connection.  By engaging in this virtual visit, you consent to the provision of healthcare and authorize for your insurance to be billed (if applicable) for the services provided during this visit. Depending on your insurance coverage, you may receive a charge related to this service.  I need to obtain your verbal consent now. Are you willing to proceed with your visit today? ERNIE SAGRERO has provided verbal consent on 07/28/2022 for a virtual visit (video or telephone). Gildardo Pounds, NP  Date: 07/28/2022 9:25 AM  Virtual Visit via Video Note   I, Gildardo Pounds, connected with  Robin Small  ([id number], 1961-07-02) on 07/28/22 at  9:15 AM EDT by a video-enabled telemedicine application and verified that I am speaking with the correct person using two identifiers.  Location: Patient: Virtual Visit Location Patient:  Home Provider: Virtual Visit Location Provider: Home Office   I discussed the limitations of evaluation and management by telemedicine and the availability of in person appointments. The patient expressed understanding and agreed to proceed.    History of Present Illness: Robin Small is a 61 y.o. who identifies as a female who was assigned female at birth, and is being seen today for [facility] TEST.  Tested positive for covid today. Husband and daughter also are COVID positive. Current symptoms include: productive Cough, nasal congestion, headache, purulent nasal drainage, bilateral ear pressure  Problems:  Patient Active Problem List   Diagnosis Date Noted   Lichen sclerosus 90/24/0973   TMJ (temporomandibular joint syndrome) 05/24/2022   Seasonal allergic rhinitis due to pollen 05/24/2022   Obesity (BMI 30.0-34.9) 05/24/2022   Sleep disturbance 05/24/2022   Primary osteoarthritis of right knee 09/15/2019   Primary osteoarthritis of left knee 03/03/2019   Seasonal and perennial allergic rhinitis 02/13/2019   S/P hemorrhoidectomy 02/01/2017   Nausea with vomiting, unspecified 01/24/2017   Sinusitis, acute 08/24/2013   Vesicular rash 08/24/2013   Dyspnea 01/08/2013   Sleep apnea 01/08/2013   Muscle ache 02/08/2012   Personal history of colonic polyps 04/06/2011   EXTERNAL HEMORRHOIDS 12/27/2010   Mild intermittent asthma 06/03/2010   LUMBAR SPRAIN AND STRAIN 07/20/2008   ANXIETY DEPRESSION 06/28/2008   Dyslipidemia 07/10/2007   DEPRESSION 05/29/2007    Allergies:  Allergies  Allergen Reactions   Codeine Nausea Only   Medications:  Current Outpatient Medications:    brompheniramine-pseudoephedrine-DM 30-2-10 MG/5ML syrup, Take 5 mLs by mouth 4 (four) times daily as needed., Disp: 240 mL, Rfl: 0   nirmatrelvir/ritonavir EUA (PAXLOVID) 20 x 150 MG & 10 x '100MG'$  TABS, Take 3 tablets by mouth 2 (two) times daily for 5 days. (Take nirmatrelvir 150 mg two tablets twice  daily for 5 days and ritonavir 100 mg one tablet twice daily for 5 days) Patient GFR is 95, Disp: 30 tablet, Rfl: 0   acetaminophen (TYLENOL) 500 MG tablet, Take 1,000 mg by mouth every 6 (six) hours as needed for moderate pain., Disp: , Rfl:    albuterol (PROAIR HFA) 108 (90 Base) MCG/ACT inhaler, INHALE 2 PUFFS INTO THE LUNGS EVERY 6 HOURS AS NEEDED FOR WHEEZING OR SHORTNESS OF BREATH, Disp: 8.5 g, Rfl: 0   atorvastatin (LIPITOR) 20 MG tablet, Take 1 tablet (20 mg total) by mouth daily., Disp: 90 tablet, Rfl: 3   azelastine (ASTELIN) 0.1 % nasal spray, Place 1 spray into both nostrils 2 (two) times daily. Use in each nostril as directed, Disp: 30 mL, Rfl: 12   baclofen (LIORESAL) 10 MG tablet, Take 1 tablet (10 mg total) by mouth 3 (three) times daily., Disp: 30 each, Rfl: 0   Calcium Carbonate-Vitamin D (CALCIUM-D) 600-400 MG-UNIT TABS, Take 2 tablets by mouth at bedtime. , Disp: , Rfl:    clobetasol cream (TEMOVATE) 5.17 %, Apply 1 application topically 2 (two) times daily as needed (skin irritation)., Disp: , Rfl:    diclofenac (VOLTAREN) 75 MG EC tablet, TAKE 1 TABLET(75 MG) BY MOUTH TWICE DAILY, Disp: 60 tablet, Rfl: 0   fluticasone (FLONASE) 50 MCG/ACT nasal spray, Place 2 sprays into both nostrils daily., Disp: 16 g, Rfl: 0   levocetirizine (XYZAL) 5 MG tablet, Take 1 tablet (5 mg total) by mouth every morning., Disp: 90 tablet, Rfl: 3   montelukast (SINGULAIR) 10 MG tablet, Take 1 tablet (10 mg total) by mouth at bedtime., Disp: 90 tablet, Rfl: 3   Multiple Vitamins-Minerals (AIRBORNE PO), Take 2 tablets by mouth daily., Disp: , Rfl:    phentermine 37.5 MG capsule, Take 1 capsule (37.5 mg total) by mouth every morning., Disp: 90 capsule, Rfl: 0   zolpidem (AMBIEN) 10 MG tablet, TAKE 1 TABLET(10 MG) BY MOUTH AT BEDTIME AS NEEDED FOR SLEEP, Disp: 30 tablet, Rfl: 0  Observations/Objective: Patient is well-developed, well-nourished in no acute distress.  Resting comfortably at home.  Head is  normocephalic, atraumatic.  No labored breathing.  Speech is clear and coherent with logical content.  Patient is alert and oriented at baseline.    Assessment and Plan: 1. Positive self-administered antigen test for COVID-19 - brompheniramine-pseudoephedrine-DM 30-2-10 MG/5ML syrup; Take 5 mLs by mouth 4 (four) times daily as needed.  Dispense: 240 mL; Refill: 0 - nirmatrelvir/ritonavir EUA (PAXLOVID) 20 x 150 MG & 10 x '100MG'$  TABS; Take 3 tablets by mouth 2 (two) times daily for 5 days. (Take nirmatrelvir 150 mg two tablets twice daily for 5 days and ritonavir 100 mg one tablet twice daily for 5 days) Patient GFR is 95  Dispense: 30 tablet; Refill: 0 INSTRUCTIONS: use a humidifier for nasal congestion Drink plenty of fluids, rest and wash hands frequently to avoid the spread of infection Alternate tylenol and Motrin for relief of fever   Follow Up Instructions: I discussed the assessment and treatment plan with the patient. The patient was provided an opportunity to ask questions and all were answered. The patient agreed  with the plan and demonstrated an understanding of the instructions.  A copy of instructions were sent to the patient via MyChart unless otherwise noted below.    The patient was advised to call back or seek an in-person evaluation if the symptoms worsen or if the condition fails to improve as anticipated.  Time:  I spent 12 minutes with the patient via telehealth technology discussing the above problems/concerns.    Gildardo Pounds, NP

## 2022-08-07 ENCOUNTER — Telehealth (INDEPENDENT_AMBULATORY_CARE_PROVIDER_SITE_OTHER): Payer: Commercial Managed Care - HMO | Admitting: Family Medicine

## 2022-08-07 ENCOUNTER — Encounter: Payer: Self-pay | Admitting: Nurse Practitioner

## 2022-08-07 ENCOUNTER — Telehealth: Payer: Self-pay | Admitting: Registered Nurse

## 2022-08-07 DIAGNOSIS — U071 COVID-19: Secondary | ICD-10-CM | POA: Diagnosis not present

## 2022-08-07 DIAGNOSIS — J0100 Acute maxillary sinusitis, unspecified: Secondary | ICD-10-CM

## 2022-08-07 MED ORDER — DOXYCYCLINE HYCLATE 100 MG PO TABS: 100.0000 mg | ORAL_TABLET | Freq: Two times a day (BID) | ORAL | 0 refills | Status: DC

## 2022-08-07 MED ORDER — BENZONATATE 100 MG PO CAPS: ORAL_CAPSULE | ORAL | 0 refills | Status: DC

## 2022-08-07 NOTE — Telephone Encounter (Signed)
Repeating the Paxlovid is not recommended.  She needs to treat w/ fluids, rest, OTC cough medications/expectorants (Delsym, Robitussin, Mucinex), tylenol or ibuprofen for fever/body aches.  If symptoms are worsening she needs appt to be evaluated

## 2022-08-07 NOTE — Progress Notes (Signed)
Virtual Visit via Telephone Note  I connected with Robin Small on 08/07/22 at 11:20 AM EDT by telephone and verified that I am speaking with the correct person using two identifiers.   I discussed the limitations of performing an evaluation and management service by telephone and requested permission for a phone visit. The patient expressed understanding and agreed to proceed.  Location patient:  [place] Location provider: work or home office Participants present for the call: patient, provider Patient did not have a visit with me in the prior 7 days to address this/these issue(s).   History of Present Illness:  Acute telemedicine visit for Covid19: -Onset: 07/28/22, tested positive for covid  -she was started on paxlovid which she has finished -Symptoms include: nasal congestion - some thick at times, diarrhea, mild cough, sinus issues started to improve but then worsened yesterday, sinuses have now developed thick chunky congestion making it difficult to breath through her nose, now has developed max sinus discomfort, ear fulls as well -covid test is still positive at home -Denies:CP, SOB, vomiting -tolerating oral intake of fluids -Has tried:nasal spray, flonase, allergy pill -Pertinent past medical history: see below -Pertinent medication allergies:  Allergies  Allergen Reactions   Codeine Nausea Only  -COVID-19 vaccine status: Immunization History  Administered Date(s) Administered   Influenza Whole 12/11/2004, 09/20/2008, 09/16/2012   Influenza,inj,Quad PF,6+ Mos 08/24/2013, 02/13/2019, 10/03/2020   Influenza-Unspecified 08/18/2019   Moderna SARS-COV2 Booster Vaccination 09/14/2021   Moderna Sars-Covid-2 Vaccination 02/25/2020, 03/28/2020   Pneumococcal Polysaccharide-23 10/11/2004   Td 12/15/2004   Tdap 05/25/2020      Past Medical History:  Diagnosis Date   Anxiety    Depression    Diverticulosis 03/2016   Mild, noted on colonoscopy   High cholesterol    History  of kidney stones    Lichen sclerosus    Anus   Low blood pressure reading    OA (osteoarthritis)    OSA on CPAP    Perianal cyst    Personal history of colonic polyps 04/06/2011   PONV (postoperative nausea and vomiting)    after c section, did well after knee replacement   PPD positive    Age 51 >> reports taking medicine for one year   Pre-diabetes    per pt report   Seasonal allergies     Current Outpatient Medications on File Prior to Visit  Medication Sig Dispense Refill   acetaminophen (TYLENOL) 500 MG tablet Take 1,000 mg by mouth every 6 (six) hours as needed for moderate pain.     albuterol (PROAIR HFA) 108 (90 Base) MCG/ACT inhaler INHALE 2 PUFFS INTO THE LUNGS EVERY 6 HOURS AS NEEDED FOR WHEEZING OR SHORTNESS OF BREATH 8.5 g 0   atorvastatin (LIPITOR) 20 MG tablet Take 1 tablet (20 mg total) by mouth daily. 90 tablet 3   azelastine (ASTELIN) 0.1 % nasal spray Place 1 spray into both nostrils 2 (two) times daily. Use in each nostril as directed 30 mL 12   baclofen (LIORESAL) 10 MG tablet Take 1 tablet (10 mg total) by mouth 3 (three) times daily. 30 each 0   brompheniramine-pseudoephedrine-DM 30-2-10 MG/5ML syrup Take 5 mLs by mouth 4 (four) times daily as needed. 240 mL 0   clobetasol cream (TEMOVATE) 6.16 % Apply 1 application topically 2 (two) times daily as needed (skin irritation).     diclofenac (VOLTAREN) 75 MG EC tablet TAKE 1 TABLET(75 MG) BY MOUTH TWICE DAILY 60 tablet 0   fluticasone (FLONASE) 50 MCG/ACT  nasal spray Place 2 sprays into both nostrils daily. 16 g 0   levocetirizine (XYZAL) 5 MG tablet Take 1 tablet (5 mg total) by mouth every morning. 90 tablet 3   montelukast (SINGULAIR) 10 MG tablet Take 1 tablet (10 mg total) by mouth at bedtime. 90 tablet 3   Multiple Vitamins-Minerals (AIRBORNE PO) Take 2 tablets by mouth daily.     phentermine 37.5 MG capsule Take 1 capsule (37.5 mg total) by mouth every morning. 90 capsule 0   zolpidem (AMBIEN) 10 MG tablet  TAKE 1 TABLET(10 MG) BY MOUTH AT BEDTIME AS NEEDED FOR SLEEP 30 tablet 0   Calcium Carbonate-Vitamin D (CALCIUM-D) 600-400 MG-UNIT TABS Take 2 tablets by mouth at bedtime.  (Patient not taking: Reported on 08/07/2022)     No current facility-administered medications on file prior to visit.    Observations/Objective: Patient sounds cheerful and well on the phone. I do not appreciate any SOB. Speech and thought processing are grossly intact. Patient reported vitals:  Assessment and Plan:  COVID-19  Acute maxillary sinusitis, recurrence not specified  -we discussed possible serious and likely etiologies, options for evaluation and workup, limitations of telemedicine visit vs in person visit, treatment, treatment risks and precautions. Pt prefers to treat via telemedicine empirically rather than in person at this moment. Suspect fluctuating viral levels with persistent covid19 most likely, possible sinusitis. Have seen a fair number of patients with intermittent waxing and waning symptoms with covid for several weeks (unfortunately occasionally for several months.) This also could be a developing 2ndary sinusitis vs other. She has opted to try saline sinus rinses, tessalon rx for cough and initiation of doxy if worsening or not improving over the next few days.  Work/School slipped offered:declined Advised to seek prompt virtual visit or in person care if worsening, new symptoms arise, or if is not improving with treatment as expected per our conversation of expected course. Discussed options for follow up care. Did let this patient know that I do telemedicine on Tuesdays and Thursdays for [facility] and those are the days I am logged into the system. Advised to schedule follow up visit with PCP, [facility] virtual visits or [facility] if any further questions or concerns to avoid delays in care.   I discussed the assessment and treatment plan with the patient. The patient was provided an opportunity to ask  questions and all were answered. The patient agreed with the plan and demonstrated an understanding of the instructions.    Follow Up Instructions:  I did not refer this patient for an OV with me in the next 24 hours for this/these issue(s).  I discussed the assessment and treatment plan with the patient. The patient was provided an opportunity to ask questions and all were answered. The patient agreed with the plan and demonstrated an understanding of the instructions.   I spent 24 minutes on the date of this visit in the care of this patient. See summary of tasks completed to properly care for this patient in the detailed notes above which also included counseling of above, review of PMH, medications, allergies, evaluation of the patient and ordering and/or  instructing patient on testing and care options.     Lucretia Kern, DO

## 2022-08-07 NOTE — Telephone Encounter (Signed)
Caller name: [place]  On DPR? :yes/no: Yes  Call back number: [phone]  Provider they see: Maximiano Coss  Reason for call: Pt took full dose of paxlovid and tested positive for COVID again on day 5.  Wondering if she could get another refill of the paxlovid. Or anexpectorant.

## 2022-08-07 NOTE — Patient Instructions (Signed)
  HOME CARE TIPS:  -COVID19 testing information: [email or web address].[email or web address]  Most pharmacies also offer testing and home test kits. If the Covid19 test is positive and you desire antiviral treatment, please contact a [facility] or schedule a follow up virtual visit through your primary care office or through the [facility].  Other test to treat options: ConnectRV.is?[email or web address]  -I sent the medication(s) we discussed to your pharmacy: Meds ordered this encounter  Medications   benzonatate (TESSALON PERLES) 100 MG capsule    Sig: 1-2 capsule up to twice daily as needed for cough    Dispense:  30 capsule    Refill:  0   doxycycline (VIBRA-TABS) 100 MG tablet    Sig: Take 1 tablet (100 mg total) by mouth 2 (two) times daily.    Dispense:  20 tablet    Refill:  0     -there is a chance of rebound illness with covid after improving. This can happen whether or not you take an antiviral treatment. You should continue to wear a high quality (N95, KN95 or KF94), snug fitting mask, around others until symptoms resolve and covid testing is negating on rapid antigen tests.  -can use tylenol if needed for fevers, aches and pains per instructions  -nasal saline sinus rinses twice daily  -stay hydrated, drink plenty of fluids and eat small healthy meals - avoid dairy  -can take 1000 IU (22mg) Vit D3 and 100-500 mg of Vit C daily per instructions  -follow up with your doctor in 2-3 days unless improving and feeling better   It was nice to meet you today, and I really hope you are feeling better soon. I help [facility] out with telemedicine visits on Tuesdays and Thursdays and am happy to help if you need a follow up virtual visit on those days. Otherwise, if you have any concerns or questions following this visit please schedule a follow up visit with your Primary Care doctor or seek care at a local urgent care clinic to avoid delays  in care.    Seek in person care or schedule a follow up video visit promptly if your symptoms worsen, new concerns arise or you are not improving with treatment. Call 911 and/or seek emergency care if your symptoms are severe or life threatening.

## 2022-08-07 NOTE — Telephone Encounter (Signed)
Pt we did not treat her for initial COVID we would need a visit with her to prescribe for rebound COVID correct?

## 2022-08-16 ENCOUNTER — Telehealth: Payer: Self-pay

## 2022-08-16 ENCOUNTER — Other Ambulatory Visit: Payer: Self-pay | Admitting: Family Medicine

## 2022-08-16 NOTE — Telephone Encounter (Signed)
Called patient to follow up how patient is doing with Tessalon medication.   Left a voicemail to call us back.

## 2022-08-30 ENCOUNTER — Telehealth: Payer: Commercial Managed Care - HMO | Admitting: Family Medicine

## 2022-08-30 DIAGNOSIS — R0981 Nasal congestion: Secondary | ICD-10-CM

## 2022-08-30 NOTE — Progress Notes (Signed)
[facility]   Face to face recommended post covid with paxlovid- developed rebound covid, and sinus infection had 10 days of doxy still having mucus and now low grade fevers.  Patient acknowledged agreement and understanding of the plan.

## 2022-08-31 ENCOUNTER — Ambulatory Visit (INDEPENDENT_AMBULATORY_CARE_PROVIDER_SITE_OTHER): Payer: Commercial Managed Care - HMO | Admitting: Internal Medicine

## 2022-08-31 ENCOUNTER — Other Ambulatory Visit: Payer: Self-pay | Admitting: Internal Medicine

## 2022-08-31 VITALS — BP 90/62 | HR 67 | Temp 98.3°F | Resp 14 | Ht 61.0 in | Wt 172.8 lb

## 2022-08-31 DIAGNOSIS — J301 Allergic rhinitis due to pollen: Secondary | ICD-10-CM

## 2022-08-31 DIAGNOSIS — U099 Post covid-19 condition, unspecified: Secondary | ICD-10-CM | POA: Diagnosis not present

## 2022-08-31 DIAGNOSIS — U071 COVID-19: Secondary | ICD-10-CM | POA: Diagnosis not present

## 2022-08-31 DIAGNOSIS — J329 Chronic sinusitis, unspecified: Secondary | ICD-10-CM | POA: Diagnosis not present

## 2022-08-31 DIAGNOSIS — K648 Other hemorrhoids: Secondary | ICD-10-CM | POA: Insufficient documentation

## 2022-08-31 MED ORDER — PSEUDOEPH-BROMPHEN-DM 30-2-10 MG/5ML PO SYRP: 5.0000 mL | ORAL_SOLUTION | Freq: Four times a day (QID) | ORAL | 0 refills | Status: DC | PRN

## 2022-08-31 MED ORDER — FLUTICASONE PROPIONATE 50 MCG/ACT NA SUSP: 2.0000 | Freq: Every day | NASAL | 0 refills | Status: DC

## 2022-08-31 MED ORDER — DOXYCYCLINE HYCLATE 100 MG PO TABS: 100.0000 mg | ORAL_TABLET | Freq: Two times a day (BID) | ORAL | 1 refills | Status: DC

## 2022-08-31 NOTE — Patient Instructions (Addendum)
It was a pleasure seeing you today!  Today the plan is... I think that it is either a virus bacteria allergy or autoimmune response to COVID causing severe chronic sinusitis.  I therefore think the treatment is Flonase for the allergy or autoimmune and antibiotics for the possible bacterial infection and if it is viral there is just not much we can do but organ to treat all the other stuff.  So the best way to get the other stuff treated is to use an aggressive nasal rinsing with extra strength simply saline nasal mist spray until you can decongest yourself is much as possible then use the Flonase at least once a day and I encourage you using it as many times a day as you want to to stay decongested.  I recommend against using other decongestants except for Sudafed is okay  Chronic recurrent sinusitis -     Doxycycline Hyclate; Take 1 tablet (100 mg total) by mouth 2 (two) times daily.  Dispense: 20 tablet; Refill: 1  Post-COVID syndrome  Positive self-administered antigen test for COVID-19 -     Pseudoeph-Bromphen-DM; Take 5 mLs by mouth 4 (four) times daily as needed.  Dispense: 240 mL; Refill: 0  Seasonal allergic rhinitis due to pollen -     Fluticasone Propionate; Place 2 sprays into both nostrils daily.  Dispense: 16 g; Refill: 0   Loralee Pacas, MD   Follow up soon for a transfer of care   - Please bring all your medicines to your next appointment. This is the best way for me to know exactly what you're taking.  - If your condition begins to worsen or become severe:  go to the ER. - If your condition fails to resolve or you have other questions / concerns: please contact me via phone [phone] or MyChart messaging.     IF you received an x-ray today, you will receive an invoice from [facility]. Please contact [facility] at [phone] with questions or concerns regarding your invoice.    IF you received labwork today, you will receive an invoice from  [facility]. Please contact [facility] at [phone] with questions or concerns regarding your invoice.    Our billing staff will not be able to assist you with questions regarding bills from these companies.   You will be contacted with the lab results as soon as they are available. The fastest way to get your results is to activate your My Chart account. Instructions are located on the last page of this paperwork. If you have not heard from [place] regarding the results in 2 weeks, please contact this office. For any labs or imaging tests, we will call you if the results are significantly abnormal.  Most normal results will be posted to myChart as soon as they are available and I will comment on them there within 2-3 business days.

## 2022-08-31 NOTE — Progress Notes (Signed)
[place] at [employer]:  [phone]   Routine Medical Office Visit  Patient:  Robin Small      Age: 61 y.o.       Sex:  female  Date:   08/31/2022  PCP:    Maximiano Coss, NP    [place] Provider: Loralee Pacas, MD  Assessment/Plan:   Raylin was seen today for possible sinus infection.  Chronic recurrent sinusitis -     Doxycycline Hyclate; Take 1 tablet (100 mg total) by mouth 2 (two) times daily.  Dispense: 20 tablet; Refill: 1  Post-COVID syndrome  Positive self-administered antigen test for COVID-19 -     Pseudoeph-Bromphen-DM; Take 5 mLs by mouth 4 (four) times daily as needed.  Dispense: 240 mL; Refill: 0  Seasonal allergic rhinitis due to pollen -     Fluticasone Propionate; Place 2 sprays into both nostrils daily.  Dispense: 16 g; Refill: 0    I think that it is either a virus bacteria allergy or autoimmune response to COVID causing severe chronic sinusitis persisttent x 1 month.  I therefore think the treatment is Flonase for the allergy or autoimmune and antibiotics for the possible bacterial infection and if it is viral there is just not much we can do but we are going to treat all the other stuff.  So the best way to get the other stuff treated is to use an aggressive nasal rinsing with extra strength simply saline nasal mist spray until you can decongest yourself is much as possible then use the Flonase at least once a day and I encourage you using it as many times a day as you want to to stay decongested.  I recommend against using other decongestants except for Sudafed is okay so refilling the sudafed syrup.  Common side effects, risks, benefits, and alternatives for medications and treatment plan prescribed today were discussed, and she expressed understanding of the given instructions.  Medication list was reconciled and provided to the patient in the AVS. Patient instructions and summary information was reviewed with her as documented in  the AVS.  Patient is instructed to call or message via MyChart if she has any questions or concerns regarding our treatment plan.  No barriers to understanding were identified.  Additional information was provided in the AVS (see AVS) regarding the diagnosis/treatment plan for her to review    Subjective:   Robin Small is a 61 y.o. female with PMH significant for: Past Medical History:  Diagnosis Date   Anxiety    Depression    Diverticulosis 03/2016   Mild, noted on colonoscopy   High cholesterol    History of kidney stones    Lichen sclerosus    Anus   Low blood pressure reading    OA (osteoarthritis)    OSA on CPAP    Perianal cyst    Personal history of colonic polyps 04/06/2011   PONV (postoperative nausea and vomiting)    after c section, did well after knee replacement   PPD positive    Age 74 >> reports taking medicine for one year   Pre-diabetes    per pt report   Seasonal allergies      She main concern for today's visit is: Chief Complaint  Patient presents with   Possible sinus infection    COVID-tested positive around 8/10.      Additional physician collected history: Covid dx 07/26/22 took paxlovid, retested as negative then went positive again and  wasn't offered paxlovid 2nd time around. They called it covid rebound.  They are doing studies to see if its covid rebound She has ongoing every now and then cough and when on a roll coughs up chunks but feels like its in her head not her chest. Feels like nasal cavity swelling Had low grade fevers last couple days Cant get cool Ears are popping plugged and itching. "Plugged ear" yawning pops them. Has sense of glands in neck and eustachian tubes full Has been taking benadryl. Did doxycycline right after  8-10 days which helped some. Got through some of it and loosened it.       Objective:  Physical Exam: BP 90/62 (BP Location: Left Arm, Patient Position: Sitting)   Pulse 67   Temp 98.3 F (36.8 C)  (Temporal)   Resp 14   Ht '5\' 1"'$  (1.549 m)   Wt 172 lb 12.8 oz (78.4 kg)   SpO2 94%   BMI 32.65 kg/m   She  is a polite, friendly, and genuine person Constitutional: NAD, AAO, not ill-appearing  Neuro: alert, no focal deficit obvious, articulate speech Psych: normal mood, behavior, thought content   Problem specific physical exam findings:  Extremely stuffy with no nasal airflow, thick secretions

## 2022-09-27 ENCOUNTER — Other Ambulatory Visit: Payer: Self-pay

## 2022-09-27 DIAGNOSIS — G479 Sleep disorder, unspecified: Secondary | ICD-10-CM

## 2022-09-27 MED ORDER — ZOLPIDEM TARTRATE 10 MG PO TABS: ORAL_TABLET | ORAL | 0 refills | Status: DC

## 2022-09-27 NOTE — Telephone Encounter (Signed)
Patient is requesting a refill of the following medications: Requested Prescriptions   Pending Prescriptions Disp Refills   zolpidem (AMBIEN) 10 MG tablet 30 tablet 0    Sig: TAKE 1 TABLET(10 MG) BY MOUTH AT BEDTIME AS NEEDED FOR SLEEP    Date of patient request: 09/27/22 Last office visit: 05/24/22 Date of last refill: 07/17/22 Last refill amount: 30 Follow up time period per chart: 1 year    Placed note on prescription about new pcp will also discuss when I call about refill. Appears this is the first request for this medication since R Orland Mustard has left

## 2022-09-27 NOTE — Telephone Encounter (Signed)
Previously followed by Maximiano Coss.  Physical noted from June 8, insomnia discussed at that time.  Continued on Ambien.  Controlled substance database reviewed.  Last filled for #30 on 07/17/2022. I will refill temporarily for #30, but will need to follow-up with new primary care provider for ongoing refills.

## 2022-11-07 ENCOUNTER — Other Ambulatory Visit: Payer: Self-pay | Admitting: Family Medicine

## 2022-11-07 DIAGNOSIS — G479 Sleep disorder, unspecified: Secondary | ICD-10-CM

## 2022-11-08 NOTE — Telephone Encounter (Signed)
Zolpidem 10 mg LOV: 05/24/22 Last Refill:09/27/22 Upcoming appt: none

## 2022-11-09 NOTE — Telephone Encounter (Signed)
Previous patient of Maximiano Coss, NP.  I do not see that she is established with new PCP.  Ambien discussed at physical on June 8.  Controlled substance database reviewed.  Ambien No. 30 last filled 09/27/2022.  At that time I did asked that she follow-up with new primary care provider for further refills.  I do not see any documentation regarding new PCP discussed from that refill encounter.  I will refill 1 additional time but will need to have a new primary care provider follow-up medications in the future.

## 2022-11-12 LAB — HM MAMMOGRAPHY

## 2022-11-14 ENCOUNTER — Other Ambulatory Visit: Payer: Self-pay | Admitting: Obstetrics and Gynecology

## 2022-11-14 DIAGNOSIS — Z720 Tobacco use: Secondary | ICD-10-CM

## 2022-12-07 ENCOUNTER — Telehealth: Payer: Self-pay | Admitting: Family Medicine

## 2022-12-07 NOTE — Telephone Encounter (Signed)
Caller Name: pt Call back phone #: [phone]   MEDICATION(S):  diclofenac ,montelukast ,zolpidem   Days of Med Remaining:   Has the patient contacted their pharmacy (YES/NO)? no What did pharmacy advise?   Preferred Pharmacy:  [id number] [address], [address] AT [facility] OF [address] Phone: [phone]  Fax: [phone]      ~~~Please advise patient/caregiver to allow 2-3 business days to process RX refills.

## 2022-12-07 NOTE — Telephone Encounter (Signed)
Advised patient that we aren't able to refill medications until she's seen in our office. Advised patient to contact previous doctors office for refills. She verbalized understanding.

## 2022-12-24 ENCOUNTER — Encounter: Payer: Commercial Managed Care - HMO | Admitting: Internal Medicine

## 2022-12-24 ENCOUNTER — Ambulatory Visit (INDEPENDENT_AMBULATORY_CARE_PROVIDER_SITE_OTHER): Payer: Commercial Managed Care - HMO | Admitting: Family Medicine

## 2022-12-24 ENCOUNTER — Encounter: Payer: Self-pay | Admitting: Family Medicine

## 2022-12-24 VITALS — BP 132/80 | HR 75 | Temp 97.4°F | Ht 61.5 in | Wt 179.4 lb

## 2022-12-24 DIAGNOSIS — G8929 Other chronic pain: Secondary | ICD-10-CM

## 2022-12-24 DIAGNOSIS — Z23 Encounter for immunization: Secondary | ICD-10-CM | POA: Diagnosis not present

## 2022-12-24 DIAGNOSIS — G479 Sleep disorder, unspecified: Secondary | ICD-10-CM

## 2022-12-24 DIAGNOSIS — J301 Allergic rhinitis due to pollen: Secondary | ICD-10-CM

## 2022-12-24 DIAGNOSIS — E66811 Obesity, class 1: Secondary | ICD-10-CM

## 2022-12-24 DIAGNOSIS — E669 Obesity, unspecified: Secondary | ICD-10-CM | POA: Diagnosis not present

## 2022-12-24 DIAGNOSIS — Z1211 Encounter for screening for malignant neoplasm of colon: Secondary | ICD-10-CM

## 2022-12-24 DIAGNOSIS — M25561 Pain in right knee: Secondary | ICD-10-CM | POA: Diagnosis not present

## 2022-12-24 DIAGNOSIS — F17200 Nicotine dependence, unspecified, uncomplicated: Secondary | ICD-10-CM

## 2022-12-24 DIAGNOSIS — M25562 Pain in left knee: Secondary | ICD-10-CM

## 2022-12-24 MED ORDER — PHENTERMINE HCL 37.5 MG PO TABS: 37.5000 mg | ORAL_TABLET | Freq: Every day | ORAL | 1 refills | Status: DC

## 2022-12-24 MED ORDER — AZELASTINE HCL 0.1 % NA SOLN: 1.0000 | Freq: Two times a day (BID) | NASAL | 12 refills | Status: DC

## 2022-12-24 MED ORDER — FLUTICASONE PROPIONATE 50 MCG/ACT NA SUSP: 2.0000 | Freq: Every day | NASAL | 3 refills | Status: DC

## 2022-12-24 MED ORDER — VARENICLINE TARTRATE (STARTER) 0.5 MG X 11 & 1 MG X 42 PO TBPK: ORAL_TABLET | ORAL | 0 refills | Status: DC

## 2022-12-24 MED ORDER — MONTELUKAST SODIUM 10 MG PO TABS: 10.0000 mg | ORAL_TABLET | Freq: Every day | ORAL | 1 refills | Status: DC

## 2022-12-24 MED ORDER — DICLOFENAC SODIUM 1 % EX GEL: 4.0000 g | Freq: Four times a day (QID) | CUTANEOUS | 1 refills | Status: DC | PRN

## 2022-12-24 MED ORDER — ZOLPIDEM TARTRATE 10 MG PO TABS: 10.0000 mg | ORAL_TABLET | Freq: Every evening | ORAL | 1 refills | Status: DC | PRN

## 2022-12-24 MED ORDER — DICLOFENAC SODIUM 75 MG PO TBEC: 75.0000 mg | DELAYED_RELEASE_TABLET | Freq: Two times a day (BID) | ORAL | 1 refills | Status: DC | PRN
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
## 2022-12-24 NOTE — Patient Instructions (Signed)
We have ordered medications as requested. Start Chantix for smoking. Continue working on weight loss. We are referring for colonoscopy.

## 2022-12-29 DIAGNOSIS — F17200 Nicotine dependence, unspecified, uncomplicated: Secondary | ICD-10-CM | POA: Insufficient documentation

## 2022-12-29 DIAGNOSIS — Z720 Tobacco use: Secondary | ICD-10-CM | POA: Insufficient documentation

## 2022-12-29 DIAGNOSIS — G8929 Other chronic pain: Secondary | ICD-10-CM | POA: Insufficient documentation

## 2022-12-29 NOTE — Assessment & Plan Note (Signed)
The patient continues to experience knee pain following bilateral knee replacements and is seeking pain management solutions. The patient prefers Voltaren gel to oral medications for inflammatory control and reports some alleviation with previous use.  Differential Diagnosis:  Osteoarthritis: Given the history of knee replacements, the ongoing joint pain suggests continued osteoarthritis symptoms or complications from the surgery. Patellofemoral pain syndrome: Could be a result of alignment or muscular imbalances following surgery. Tendonitis or bursitis: Inflammation of tendons or bursa can occur post-surgery due to altered gait or compensatory movements.  Plan: Continue use of Voltaren gel as it is preferred by the patient for localized pain relief with relatively fewer systemic side effects. Monitor kidney function due to long-term NSAID use. Orthopedic follow-up is recommended to review the cause of chronic pain and consider physical therapy for strength and gait training. Refill Voltaren gel prescription as requested.

## 2022-12-29 NOTE — Assessment & Plan Note (Signed)
Stable on Ambien, tolerating therapy well without side effects, therapy refill disordered

## 2022-12-29 NOTE — Assessment & Plan Note (Signed)
Tolerating phentermine well, has had improvement weight management, continue current therapy continue fit test, phentermine refilled

## 2022-12-29 NOTE — Assessment & Plan Note (Signed)
Stable on current regimen refilled as ordered

## 2022-12-29 NOTE — Assessment & Plan Note (Signed)
The patient has a history of smoking with a prior successful period of smoking cessation using Chantix, with relapse associated with social settings.  Plan: Prescribe Chantix starter pack to assist in smoking cessation efforts, following the patient's previous successful experience. Offer counseling support and follow-up in one month or as needed to monitor progress and address any side effects.

## 2023-01-14 ENCOUNTER — Other Ambulatory Visit: Payer: Commercial Managed Care - HMO

## 2023-02-05 ENCOUNTER — Encounter: Payer: Self-pay | Admitting: Internal Medicine

## 2023-02-11 ENCOUNTER — Ambulatory Visit
Admission: RE | Admit: 2023-02-11 | Discharge: 2023-02-11 | Disposition: A | Discharge status: Home / Self Care | Payer: Commercial Managed Care - HMO | Source: Ambulatory Visit | Attending: Obstetrics and Gynecology | Admitting: Obstetrics and Gynecology

## 2023-02-11 DIAGNOSIS — Z720 Tobacco use: Secondary | ICD-10-CM

## 2023-02-14 ENCOUNTER — Telehealth: Payer: Commercial Managed Care - HMO | Admitting: Family Medicine

## 2023-02-14 DIAGNOSIS — L237 Allergic contact dermatitis due to plants, except food: Secondary | ICD-10-CM

## 2023-02-14 MED ORDER — PREDNISONE 10 MG PO TABS: ORAL_TABLET | ORAL | 0 refills | Status: DC

## 2023-02-14 NOTE — Progress Notes (Signed)
Virtual Visit Consent   Robin Small, you are scheduled for a virtual visit with a [facility] provider today. Just as with appointments in the office, your consent must be obtained to participate. Your consent will be active for this visit and any virtual visit you may have with one of our providers in the next 365 days. If you have a MyChart account, a copy of this consent can be sent to you electronically.  As this is a virtual visit, video technology does not allow for your provider to perform a traditional examination. This may limit your provider's ability to fully assess your condition. If your provider identifies any concerns that need to be evaluated in person or the need to arrange testing (such as labs, EKG, etc.), we will make arrangements to do so. Although advances in technology are sophisticated, we cannot ensure that it will always work on either your end or our end. If the connection with a video visit is poor, the visit may have to be switched to a telephone visit. With either a video or telephone visit, we are not always able to ensure that we have a secure connection.  By engaging in this virtual visit, you consent to the provision of healthcare and authorize for your insurance to be billed (if applicable) for the services provided during this visit. Depending on your insurance coverage, you may receive a charge related to this service.  I need to obtain your verbal consent now. Are you willing to proceed with your visit today? Robin Small has provided verbal consent on 02/14/2023 for a virtual visit (video or telephone). Perlie Mayo, NP  Date: 02/14/2023 3:13 PM  Virtual Visit via Video Note   I, Perlie Mayo, connected with  Robin Small  ([id number], [birth year]) on 02/14/23 at  3:15 PM EST by a video-enabled telemedicine application and verified that I am speaking with the correct person using two identifiers.  Location: Patient: Virtual Visit Location Patient:  Home Provider: Virtual Visit Location Provider: Home Office   I discussed the limitations of evaluation and management by telemedicine and the availability of in person appointments. The patient expressed understanding and agreed to proceed.    History of Present Illness: Robin Small is a 62 y.o. who identifies as a female who was assigned female at birth, and is being seen today for rash from poison ivy/oak.  Onset was two days ago- related to working in the yard- spreading now Associated symptoms are itching over the rash- Located on hands, arms, trunk, under breast. Modifying factors are OTC creams, and poison ivy meds without relief Denies chest pain, shortness of breath, fevers, chills, n/v, no signs of infection over the rash.    Problems:  Patient Active Problem List   Diagnosis Date Noted   Chronic pain of both knees 12/29/2022   Tobacco use disorder 12/29/2022   Hemorrhoids, internal [id number]   Lichen sclerosus [id number]   TMJ (temporomandibular joint syndrome) 05/24/2022   Seasonal allergic rhinitis due to pollen 05/24/2022   Obesity (BMI 30.0-34.9) 05/24/2022   Sleep disturbance 05/24/2022   Primary osteoarthritis of right knee 09/15/2019   Primary osteoarthritis of left knee 03/03/2019   Seasonal and perennial allergic rhinitis 02/13/2019   S/P hemorrhoidectomy 02/01/2017   Nausea with vomiting, unspecified 01/24/2017   Sinusitis, acute 08/24/2013   Vesicular rash 08/24/2013   Dyspnea 01/08/2013   Sleep apnea 01/08/2013   Muscle ache 02/08/2012   Personal history of colonic polyps 04/06/2011  EXTERNAL HEMORRHOIDS 12/27/2010   Mild intermittent asthma 06/03/2010   LUMBAR SPRAIN AND STRAIN 07/20/2008   ANXIETY DEPRESSION 06/28/2008   Dyslipidemia 07/10/2007   DEPRESSION 05/29/2007    Allergies:  Allergies  Allergen Reactions   Codeine Nausea Only   Medications:  Current Outpatient Medications:    acetaminophen (TYLENOL) 500 MG tablet, Take 1,000 mg  by mouth every 6 (six) hours as needed for moderate pain., Disp: , Rfl:    albuterol (PROAIR HFA) 108 (90 Base) MCG/ACT inhaler, INHALE 2 PUFFS INTO THE LUNGS EVERY 6 HOURS AS NEEDED FOR WHEEZING OR SHORTNESS OF BREATH, Disp: 8.5 g, Rfl: 0   albuterol (VENTOLIN HFA) 108 (90 Base) MCG/ACT inhaler, Inhale 2 puffs into the lungs every 6 (six) hours as needed., Disp: , Rfl:    azelastine (ASTELIN) 0.1 % nasal spray, Place 1 spray into both nostrils 2 (two) times daily. Use in each nostril as directed, Disp: 30 mL, Rfl: 12   Calcium Carbonate-Vitamin D (CALCIUM-D) 600-400 MG-UNIT TABS, Take 2 tablets by mouth at bedtime., Disp: , Rfl:    clobetasol cream (TEMOVATE) AB-123456789 %, Apply 1 application topically 2 (two) times daily as needed (skin irritation)., Disp: , Rfl:    diclofenac (VOLTAREN) 75 MG EC tablet, Take 1 tablet (75 mg total) by mouth 2 (two) times daily as needed (pain)., Disp: 180 tablet, Rfl: 1   diclofenac Sodium (VOLTAREN) 1 % GEL, Apply 4 g topically 4 (four) times daily as needed (pain)., Disp: 350 g, Rfl: 1   fluticasone (FLONASE) 50 MCG/ACT nasal spray, Place 2 sprays into both nostrils daily., Disp: 16 g, Rfl: 3   montelukast (SINGULAIR) 10 MG tablet, Take 1 tablet (10 mg total) by mouth at bedtime., Disp: 90 tablet, Rfl: 1   Multiple Vitamins-Minerals (AIRBORNE PO), Take 2 tablets by mouth daily., Disp: , Rfl:    phentermine (ADIPEX-P) 37.5 MG tablet, Take 1 tablet (37.5 mg total) by mouth daily before breakfast., Disp: 90 tablet, Rfl: 1   tiZANidine (ZANAFLEX) 4 MG capsule, tizanidine 4 mg capsule, Disp: , Rfl:    traMADol (ULTRAM) 50 MG tablet, , Disp: , Rfl:    Varenicline Tartrate, Starter, (CHANTIX STARTING MONTH PAK) 0.5 MG X 11 & 1 MG X 42 TBPK, Take as prescribed on pack, Disp: 53 each, Rfl: 0   zolpidem (AMBIEN) 10 MG tablet, Take 1 tablet (10 mg total) by mouth at bedtime as needed for sleep. TAKE 1 TABLET(10 MG) BY MOUTH AT BEDTIME AS NEEDED FOR SLEEP Patient reports daily  12/24/2022, Disp: 90 tablet, Rfl: 1  Observations/Objective: Patient is well-developed, well-nourished in no acute distress.  Resting comfortably  at home.  Head is normocephalic, atraumatic.  No labored breathing.  Speech is clear and coherent with logical content.  Patient is alert and oriented at baseline.  Rash- poison sumac/ivy across arms, and hands  Assessment and Plan:  1. Poison ivy dermatitis  - predniSONE (DELTASONE) 10 MG tablet; Days 1-4 take 4 tablets (40 mg) daily  Days 5-8 take 3 tablets (30 mg) daily, Days 9-11 take 2 tablets (20 mg) daily, Days 12-14 take 1 tablet (10 mg) daily.  Dispense: 37 tablet; Refill: 0  -clean and dry -avoid letting drainage get on other areas of skin and clothing. -see AVS for extensive details of care and prevention as discussed   Reviewed side effects, risks and benefits of medication.    Patient acknowledged agreement and understanding of the plan.   Past Medical, Surgical, Social History, Allergies, and  Medications have been Reviewed.    Follow Up Instructions: I discussed the assessment and treatment plan with the patient. The patient was provided an opportunity to ask questions and all were answered. The patient agreed with the plan and demonstrated an understanding of the instructions.  A copy of instructions were sent to the patient via MyChart unless otherwise noted below.    The patient was advised to call back or seek an in-person evaluation if the symptoms worsen or if the condition fails to improve as anticipated.  Time:  I spent 10 minutes with the patient via telehealth technology discussing the above problems/concerns.    Perlie Mayo, NP

## 2023-02-14 NOTE — Patient Instructions (Signed)
Robin Small, thank you for joining Perlie Mayo, NP for today's virtual visit.  While this provider is not your primary care provider (PCP), if your PCP is located in our provider database this encounter information will be shared with them immediately following your visit.   [facility] account gives you access to today's visit and all your visits, tests, and labs performed at [facility] " click here if you don't have a Robin Small account or go to [email or web address]  Consent: (Patient) Robin Small provided verbal consent for this virtual visit at the beginning of the encounter.  Current Medications:  Current Outpatient Medications:    acetaminophen (TYLENOL) 500 MG tablet, Take 1,000 mg by mouth every 6 (six) hours as needed for moderate pain., Disp: , Rfl:    albuterol (PROAIR HFA) 108 (90 Base) MCG/ACT inhaler, INHALE 2 PUFFS INTO THE LUNGS EVERY 6 HOURS AS NEEDED FOR WHEEZING OR SHORTNESS OF BREATH, Disp: 8.5 g, Rfl: 0   albuterol (VENTOLIN HFA) 108 (90 Base) MCG/ACT inhaler, Inhale 2 puffs into the lungs every 6 (six) hours as needed., Disp: , Rfl:    azelastine (ASTELIN) 0.1 % nasal spray, Place 1 spray into both nostrils 2 (two) times daily. Use in each nostril as directed, Disp: 30 mL, Rfl: 12   Calcium Carbonate-Vitamin D (CALCIUM-D) 600-400 MG-UNIT TABS, Take 2 tablets by mouth at bedtime., Disp: , Rfl:    clobetasol cream (TEMOVATE) AB-123456789 %, Apply 1 application topically 2 (two) times daily as needed (skin irritation)., Disp: , Rfl:    diclofenac (VOLTAREN) 75 MG EC tablet, Take 1 tablet (75 mg total) by mouth 2 (two) times daily as needed (pain)., Disp: 180 tablet, Rfl: 1   diclofenac Sodium (VOLTAREN) 1 % GEL, Apply 4 g topically 4 (four) times daily as needed (pain)., Disp: 350 g, Rfl: 1   fluticasone (FLONASE) 50 MCG/ACT nasal spray, Place 2 sprays into both nostrils daily., Disp: 16 g, Rfl: 3   montelukast (SINGULAIR) 10 MG tablet,  Take 1 tablet (10 mg total) by mouth at bedtime., Disp: 90 tablet, Rfl: 1   Multiple Vitamins-Minerals (AIRBORNE PO), Take 2 tablets by mouth daily., Disp: , Rfl:    phentermine (ADIPEX-P) 37.5 MG tablet, Take 1 tablet (37.5 mg total) by mouth daily before breakfast., Disp: 90 tablet, Rfl: 1   tiZANidine (ZANAFLEX) 4 MG capsule, tizanidine 4 mg capsule, Disp: , Rfl:    traMADol (ULTRAM) 50 MG tablet, , Disp: , Rfl:    Varenicline Tartrate, Starter, (CHANTIX STARTING MONTH PAK) 0.5 MG X 11 & 1 MG X 42 TBPK, Take as prescribed on pack, Disp: 53 each, Rfl: 0   zolpidem (AMBIEN) 10 MG tablet, Take 1 tablet (10 mg total) by mouth at bedtime as needed for sleep. TAKE 1 TABLET(10 MG) BY MOUTH AT BEDTIME AS NEEDED FOR SLEEP Patient reports daily 12/24/2022, Disp: 90 tablet, Rfl: 1   Medications ordered in this encounter:  No orders of the defined types were placed in this encounter.    *If you need refills on other medications prior to your next appointment, please contact your pharmacy*  Follow-Up: Call back or seek an in-person evaluation if the symptoms worsen or if the condition fails to improve as anticipated.  [place] [phone]  What can you do to prevent this rash?  Avoid the plants.  Learn how to identify poison ivy, poison oak and poison sumac in all seasons.  When hiking or  engaging in other activities that might expose you to these plants, try to stay on cleared pathways.  If camping, make sure you pitch your tent in an area free of these plants.  Keep pets from running through wooded areas so that urushiol doesn't accidentally stick to their fur, which you may touch.  Remove or kill the plants.  In your yard, you can get rid of poison ivy by applying an herbicide or pulling it out of the ground, including the roots, while wearing heavy gloves.  Afterward remove the gloves and thoroughly wash them and your hands.  Don't burn poison ivy or related plants because the  urushiol can be carried by smoke.  Wear protective clothing.  If needed, protect your skin by wearing socks, boots, pants, long sleeves and vinyl gloves.  Wash your skin right away.  Washing off the oil with soap and water within 30 minutes of exposure may reduce your chances of getting a poison ivy rash.  Even washing after an hour or so can help reduce the severity of the rash.  If you walk through some poison ivy and then later touch your shoes, you may get some urushiol on your hands, which may then transfer to your face or body by touching or rubbing.  If the contaminated object isn't cleaned, the urushiol on it can still cause a skin reaction years later.    Be careful not to reuse towels after you have washed your skin.  Also carefully wash clothing in detergent and hot water to remove all traces of the oil.  Handle contaminated clothing carefully so you don't transfer the urushiol to yourself, furniture, rugs or appliances.  Remember that pets can carry the oil on their fur and paws.  If you think your pet may be contaminated with urushiol, put on some long rubber gloves and give your pet a bath.  Finally, be careful not to burn these plants as the smoke can contain traces of the oil.  Inhaling the smoke may result in difficulty breathing. If that occurred you should see a physician as soon as possible.  See your doctor right away if:  The reaction is severe or widespread You inhaled the smoke from burning poison ivy and are having difficulty breathing Your skin continues to swell The rash affects your eyes, mouth or genitals Blisters are oozing pus You develop a fever greater than 100 F (37.8 C) The rash doesn't get better within a few weeks.  If you scratch the poison ivy rash, bacteria under your fingernails may cause the skin to become infected.  See your doctor if pus starts oozing from the blisters.  Treatment generally includes antibiotics.  Poison ivy treatments are  usually limited to self-care methods.  And the rash typically goes away on its own in two to three weeks.     If the rash is widespread or results in a large number of blisters, your doctor may prescribe an oral corticosteroid, such as prednisone.  If a bacterial infection has developed at the rash site, your doctor may give you a prescription for an oral antibiotic.Other Instructions    If you have been instructed to have an in-person evaluation today at a local Urgent Care facility, please use the link below. It will take you to a list of all of our available [facility], including address, phone number and hours of operation. Please do not delay care.  [facility]  If you or a  family member do not have a primary care provider, use the link below to schedule a visit and establish care. When you choose a [facility] primary care physician or advanced practice provider, you gain a long-term partner in health. Find a Primary Care Provider  Learn more about 's in-office and virtual care options: [facility]

## 2023-02-15 ENCOUNTER — Encounter: Payer: Self-pay | Admitting: Family Medicine

## 2023-03-01 DIAGNOSIS — E785 Hyperlipidemia, unspecified: Secondary | ICD-10-CM | POA: Insufficient documentation

## 2023-03-01 DIAGNOSIS — E1169 Type 2 diabetes mellitus with other specified complication: Secondary | ICD-10-CM | POA: Insufficient documentation

## 2023-03-18 ENCOUNTER — Encounter: Payer: Self-pay | Admitting: Internal Medicine

## 2023-03-18 ENCOUNTER — Ambulatory Visit (AMBULATORY_SURGERY_CENTER): Payer: Commercial Managed Care - HMO

## 2023-03-18 ENCOUNTER — Telehealth: Payer: Self-pay

## 2023-03-18 VITALS — Ht 61.5 in | Wt 180.0 lb

## 2023-03-18 DIAGNOSIS — Z86010 Personal history of colonic polyps: Secondary | ICD-10-CM

## 2023-03-18 NOTE — Progress Notes (Signed)
No egg or soy allergy known to patient  No issues known to pt with past sedation with any surgeries or procedures Patient denies ever being told they had issues or difficulty with intubation  No FH of Malignant Hyperthermia Pt on diet pills - phentermine  Pt is not on  home 02  Pt is not on blood thinners  Pt denies issues with constipation  No A fib or A flutter Have any cardiac testing pending--no  Pt instructed to use Singlecare.com or GoodRx for a price reduction on prep

## 2023-03-19 NOTE — Telephone Encounter (Signed)
Errror

## 2023-03-21 ENCOUNTER — Encounter: Payer: Self-pay | Admitting: Internal Medicine

## 2023-04-01 ENCOUNTER — Ambulatory Visit (AMBULATORY_SURGERY_CENTER): Payer: 59 | Admitting: Internal Medicine

## 2023-04-01 ENCOUNTER — Encounter: Payer: Self-pay | Admitting: Internal Medicine

## 2023-04-01 VITALS — BP 121/69 | HR 60 | Temp 97.7°F | Resp 16 | Ht 61.5 in | Wt 180.0 lb

## 2023-04-01 DIAGNOSIS — Z1211 Encounter for screening for malignant neoplasm of colon: Secondary | ICD-10-CM | POA: Diagnosis not present

## 2023-04-01 DIAGNOSIS — F32A Depression, unspecified: Secondary | ICD-10-CM | POA: Diagnosis not present

## 2023-04-01 DIAGNOSIS — Z86010 Personal history of colonic polyps: Secondary | ICD-10-CM | POA: Diagnosis not present

## 2023-04-01 DIAGNOSIS — F419 Anxiety disorder, unspecified: Secondary | ICD-10-CM | POA: Diagnosis not present

## 2023-04-01 DIAGNOSIS — D123 Benign neoplasm of transverse colon: Secondary | ICD-10-CM

## 2023-04-01 DIAGNOSIS — D120 Benign neoplasm of cecum: Secondary | ICD-10-CM | POA: Diagnosis not present

## 2023-04-01 DIAGNOSIS — Z09 Encounter for follow-up examination after completed treatment for conditions other than malignant neoplasm: Secondary | ICD-10-CM | POA: Diagnosis not present

## 2023-04-01 DIAGNOSIS — G4733 Obstructive sleep apnea (adult) (pediatric): Secondary | ICD-10-CM | POA: Diagnosis not present

## 2023-04-01 DIAGNOSIS — R7303 Prediabetes: Secondary | ICD-10-CM | POA: Diagnosis not present

## 2023-04-01 MED ORDER — SODIUM CHLORIDE 0.9 % IV SOLN
500.0000 mL | Freq: Once | INTRAVENOUS | Status: DC
Start: 2023-04-01 — End: 2023-04-01

## 2023-04-01 NOTE — Progress Notes (Addendum)
Vitals-DT  Pt's states no medical or surgical changes since previsit or office visit.  Took phenteremine 10 days ago.

## 2023-04-01 NOTE — Op Note (Signed)
[facility] Patient Name: Robin Small Procedure Date: 04/01/2023 10:12 AM MRN: [id number] Endoscopist: Iva Boop , MD, [id number] Age: 62 Referring MD:  Date of Birth: 10-Aug-1961 Gender: Female Account #: [id number] Procedure:                Colonoscopy Indications:              Surveillance: Personal history of adenomatous                            polyps on last colonoscopy > 5 years ago, Last                            colonoscopy: 2017 Medicines:                Monitored Anesthesia Care Procedure:                Pre-Anesthesia Assessment:                           - Prior to the procedure, a History and Physical                            was performed, and patient medications and                            allergies were reviewed. The patient's tolerance of                            previous anesthesia was also reviewed. The risks                            and benefits of the procedure and the sedation                            options and risks were discussed with the patient.                            All questions were answered, and informed consent                            was obtained. Prior Anticoagulants: The patient has                            taken no anticoagulant or antiplatelet agents. ASA                            Grade Assessment: II - A patient with mild systemic                            disease. After reviewing the risks and benefits,                            the patient was deemed in satisfactory condition to  undergo the procedure.                           After obtaining informed consent, the colonoscope                            was passed under direct vision. Throughout the                            procedure, the patient's blood pressure, pulse, and                            oxygen saturations were monitored continuously. The                            Olympus CF-HQ190L [id number]) Colonoscope was                             introduced through the anus and advanced to the the                            cecum, identified by appendiceal orifice and                            ileocecal valve. The colonoscopy was performed                            without difficulty. The patient tolerated the                            procedure well. The quality of the bowel                            preparation was adequate. The ileocecal valve,                            appendiceal orifice, and rectum were photographed.                            The bowel preparation used was Miralax via split                            dose instruction. Scope In: 10:20:34 AM Scope Out: 10:34:37 AM Scope Withdrawal Time: 0 hours 11 minutes 17 seconds  Total Procedure Duration: 0 hours 14 minutes 3 seconds  Findings:                 Hemorrhoids were found on perianal exam.                           Three sessile polyps were found in the descending                            colon, transverse colon and ileocecal valve. The  polyps were diminutive in size. These polyps were                            removed with a cold snare. Resection and retrieval                            were complete. Verification of patient                            identification for the specimen was done. Estimated                            blood loss was minimal.                           The exam was otherwise without abnormality on                            direct and retroflexion views. Complications:            No immediate complications. Estimated Blood Loss:     Estimated blood loss was minimal. Impression:               - Hemorrhoids found on perianal exam. External tag                            right anterior - looks innocent. She is s/p                            excisional hemorrhoidectomy and lichen sclerosis                            lesion excision in 2018                           - Three  diminutive polyps in the descending colon,                            in the transverse colon and at the ileocecal valve,                            removed with a cold snare. Resected and retrieved.                           - The examination was otherwise normal on direct                            and retroflexion views.                           - Personal history of colonic polyps. 03/2011 1                            adenoma  04/11/2016 5 mm ascending polyp adenoma Recommendation:           - Patient has a contact number available for                            emergencies. The signs and symptoms of potential                            delayed complications were discussed with the                            patient. Return to normal activities tomorrow.                            Written discharge instructions were provided to the                            patient.                           - Resume previous diet.                           - Continue present medications.                           - Await pathology results.                           - Repeat colonoscopy is recommended for                            surveillance. The colonoscopy date will be                            determined after pathology results from today's                            exam become available for review. Iva Boop, MD 04/01/2023 10:43:46 AM This report has been signed electronically.

## 2023-04-01 NOTE — Progress Notes (Signed)
Called to room to assist during endoscopic procedure.  Patient ID and intended procedure confirmed with present staff. Received instructions for my participation in the procedure from the performing physician.  

## 2023-04-01 NOTE — Progress Notes (Signed)
[facility] History and Physical   Primary Care Physician:  Garnette Gunner, MD   Reason for Procedure:   Hx colon polyps  Plan:    colonoscopy     HPI: Robin Small is a 62 y.o. female here for surveillance colonoscopy 03/2011 1 adenoma 04/11/2016 5 mm ascending polyp adenoma   Past Medical History:  Diagnosis Date   Anxiety    Depression    Diverticulosis 03/2016   Mild, noted on colonoscopy   High cholesterol    History of kidney stones    Lichen sclerosus    Anus   Low blood pressure reading    OA (osteoarthritis)    OSA on CPAP    Perianal cyst    Personal history of colonic polyps 04/06/2011   PONV (postoperative nausea and vomiting)    after c section, did well after knee replacement   PPD positive    Age 20 >> reports taking medicine for one year   Pre-diabetes    per pt report   Seasonal allergies    Sleep apnea     Past Surgical History:  Procedure Laterality Date   ARTHROSCOPIC KNEE Left    CESAREAN SECTION     COLONOSCOPY W/ POLYPECTOMY  04/11/2016   ENDOMETRIAL ABLATION     GANGLION CYST EXCISION Right    HEMORROIDECTOMY     lichen sclerosus lesion excision     Anus   TOTAL KNEE ARTHROPLASTY Left 03/03/2019   Procedure: TOTAL KNEE ARTHROPLASTY;  Surgeon: Marcene Corning, MD;  Location: [facility];  Service: Orthopedics;  Laterality: Left;   TOTAL KNEE ARTHROPLASTY Right 09/15/2019   Procedure: RIGHT TOTAL KNEE ARTHROPLASTY;  Surgeon: Marcene Corning, MD;  Location: [facility];  Service: Orthopedics;  Laterality: Right;   TUBAL LIGATION      Prior to Admission medications   Medication Sig Start Date End Date Taking? Authorizing Provider  acetaminophen (TYLENOL) 500 MG tablet Take 1,000 mg by mouth every 6 (six) hours as needed for moderate pain.   Yes [provider]  diclofenac (VOLTAREN) 75 MG EC tablet Take 1 tablet (75 mg total) by mouth 2 (two) times daily as needed (pain). 12/24/22 06/22/23 Yes Garnette Gunner, MD   diclofenac Sodium (VOLTAREN) 1 % GEL Apply 4 g topically 4 (four) times daily as needed (pain). 12/24/22 06/22/23 Yes Garnette Gunner, MD  levocetirizine (XYZAL) 5 MG tablet Take 5 mg by mouth daily. 12/30/22  Yes [provider]  montelukast (SINGULAIR) 10 MG tablet Take 1 tablet (10 mg total) by mouth at bedtime. 12/24/22 06/22/23 Yes Garnette Gunner, MD  Multiple Vitamins-Minerals (AIRBORNE PO) Take 2 tablets by mouth daily.   Yes [provider]  tiZANidine (ZANAFLEX) 4 MG capsule tizanidine 4 mg capsule   Yes [provider]  zolpidem (AMBIEN) 10 MG tablet Take 1 tablet (10 mg total) by mouth at bedtime as needed for sleep. TAKE 1 TABLET(10 MG) BY MOUTH AT BEDTIME AS NEEDED FOR SLEEP Patient reports daily 12/24/2022 12/24/22 06/22/23 Yes Garnette Gunner, MD  albuterol [facility] HFA) 108 (90 Base) MCG/ACT inhaler INHALE 2 PUFFS INTO THE LUNGS EVERY 6 HOURS AS NEEDED FOR WHEEZING OR SHORTNESS OF BREATH 10/03/20   Janeece Agee, NP  albuterol (VENTOLIN HFA) 108 (90 Base) MCG/ACT inhaler Inhale 2 puffs into the lungs every 6 (six) hours as needed. 07/16/22   [provider]  azelastine (ASTELIN) 0.1 % nasal spray Place 1 spray into both nostrils 2 (two) times daily.  Use in each nostril as directed 12/24/22   Garnette Gunner, MD  Calcium Carbonate-Vitamin D (CALCIUM-D) 600-400 MG-UNIT TABS Take 2 tablets by mouth at bedtime.    [provider]  clobetasol cream (TEMOVATE) 0.05 % Apply 1 application topically 2 (two) times daily as needed (skin irritation).    [provider]  fluticasone (FLONASE) 50 MCG/ACT nasal spray Place 2 sprays into both nostrils daily. 12/24/22 06/22/23  Garnette Gunner, MD  HYDROcodone-acetaminophen (NORCO/VICODIN) 5-325 MG tablet Take 1 tablet by mouth every 6 (six) hours as needed. 01/30/17   [provider]  phentermine (ADIPEX-P) 37.5 MG tablet Take 1 tablet (37.5 mg total) by mouth daily before breakfast. 12/24/22 06/22/23   Garnette Gunner, MD  traMADol Janean Sark) 50 MG tablet     [provider]  Varenicline Tartrate, Starter, (CHANTIX STARTING MONTH PAK) 0.5 MG X 11 & 1 MG X 42 TBPK Take as prescribed on pack Patient not taking: Reported on 03/18/2023 12/24/22   Garnette Gunner, MD    Current Outpatient Medications  Medication Sig Dispense Refill   acetaminophen (TYLENOL) 500 MG tablet Take 1,000 mg by mouth every 6 (six) hours as needed for moderate pain.     diclofenac (VOLTAREN) 75 MG EC tablet Take 1 tablet (75 mg total) by mouth 2 (two) times daily as needed (pain). 180 tablet 1   diclofenac Sodium (VOLTAREN) 1 % GEL Apply 4 g topically 4 (four) times daily as needed (pain). 350 g 1   levocetirizine (XYZAL) 5 MG tablet Take 5 mg by mouth daily.     montelukast (SINGULAIR) 10 MG tablet Take 1 tablet (10 mg total) by mouth at bedtime. 90 tablet 1   Multiple Vitamins-Minerals (AIRBORNE PO) Take 2 tablets by mouth daily.     tiZANidine (ZANAFLEX) 4 MG capsule tizanidine 4 mg capsule     zolpidem (AMBIEN) 10 MG tablet Take 1 tablet (10 mg total) by mouth at bedtime as needed for sleep. TAKE 1 TABLET(10 MG) BY MOUTH AT BEDTIME AS NEEDED FOR SLEEP Patient reports daily 12/24/2022 90 tablet 1   albuterol (PROAIR HFA) 108 (90 Base) MCG/ACT inhaler INHALE 2 PUFFS INTO THE LUNGS EVERY 6 HOURS AS NEEDED FOR WHEEZING OR SHORTNESS OF BREATH 8.5 g 0   albuterol (VENTOLIN HFA) 108 (90 Base) MCG/ACT inhaler Inhale 2 puffs into the lungs every 6 (six) hours as needed.     azelastine (ASTELIN) 0.1 % nasal spray Place 1 spray into both nostrils 2 (two) times daily. Use in each nostril as directed 30 mL 12   Calcium Carbonate-Vitamin D (CALCIUM-D) 600-400 MG-UNIT TABS Take 2 tablets by mouth at bedtime.     clobetasol cream (TEMOVATE) 0.05 % Apply 1 application topically 2 (two) times daily as needed (skin irritation).     fluticasone (FLONASE) 50 MCG/ACT nasal spray Place 2 sprays into both nostrils daily. 16 g 3    HYDROcodone-acetaminophen (NORCO/VICODIN) 5-325 MG tablet Take 1 tablet by mouth every 6 (six) hours as needed.     phentermine (ADIPEX-P) 37.5 MG tablet Take 1 tablet (37.5 mg total) by mouth daily before breakfast. 90 tablet 1   traMADol (ULTRAM) 50 MG tablet  (Patient not taking: Reported on 03/18/2023)     Varenicline Tartrate, Starter, (CHANTIX STARTING MONTH PAK) 0.5 MG X 11 & 1 MG X 42 TBPK Take as prescribed on pack (Patient not taking: Reported on 03/18/2023) 53 each 0   Current Facility-Administered Medications  Medication Dose Route Frequency Provider  Last Rate Last Admin   0.9 %  sodium chloride infusion  500 mL Intravenous Once Iva Boop, MD       0.9 %  sodium chloride infusion  500 mL Intravenous Once Iva Boop, MD        Allergies as of 04/01/2023 - Review Complete 04/01/2023  Allergen Reaction Noted   Codeine Nausea Only     Family History  Problem Relation Age of Onset   Colon polyps Mother    Diabetes Mother    Hypertension Mother    Heart disease Mother    Hyperlipidemia Mother    Lung cancer Father    Alcohol abuse Brother    Hepatitis Brother        hep c   Breast cancer Maternal Grandmother        breast   Colon cancer Neg Hx    Esophageal cancer Neg Hx    Rectal cancer Neg Hx    Stomach cancer Neg Hx     Social History   Socioeconomic History   Marital status: Married    Spouse name: Not on file   Number of children: Not on file   Years of education: Not on file   Highest education level: Not on file  Occupational History   Occupation: internet support  Tobacco Use   Smoking status: Some Days    Packs/day: 0.50    Years: 42.00    Additional pack years: 0.00    Total pack years: 21.00    Types: Cigarettes    Last attempt to quit: 07/31/2019    Years since quitting: 3.6    Passive exposure: Never   Smokeless tobacco: Never   Tobacco comments:    1 pack per week on and off  Vaping Use   Vaping Use: Never used  Substance and  Sexual Activity   Alcohol use: Yes    Alcohol/week: 0.0 standard drinks of alcohol    Comment: rare   Drug use: No   Sexual activity: Yes    Partners: Male    Birth control/protection: Post-menopausal, Surgical  Other Topics Concern   Not on file  Social History Narrative   Exercise-- daily for 1 hour   Social Determinants of Health   Financial Resource Strain: Not on file  Food Insecurity: Not on file  Transportation Needs: Not on file  Physical Activity: Not on file  Stress: Not on file  Social Connections: Not on file  Intimate Partner Violence: Not on file    Review of Systems:  All other review of systems negative except as mentioned in the HPI.  Physical Exam: Vital signs BP 123/66 (BP Location: Right Arm, Patient Position: Sitting, Cuff Size: Normal)   Pulse 63   Temp 97.7 F (36.5 C) (Temporal)   Ht 5' 1.5" (1.562 m)   Wt 180 lb (81.6 kg)   SpO2 95%   BMI 33.46 kg/m   General:   Alert,  Well-developed, well-nourished, pleasant and cooperative in NAD Lungs:  Clear throughout to auscultation.   Heart:  Regular rate and rhythm; no murmurs, clicks, rubs,  or gallops. Abdomen:  Soft, nontender and nondistended. Normal bowel sounds.   Neuro/Psych:  Alert and cooperative. Normal mood and affect. A and O x 3   @Haedyn Ancrum  Sena Slate, MD, [facility] [phone] (pager) 04/01/2023 10:09 AM@

## 2023-04-01 NOTE — Patient Instructions (Addendum)
I found and removed 3 polyps - all small and benign-appearing.  There is an external hemorrhoid or anal tag at the anus. It looks benign and innocent to me.   I will let you know pathology results and when to have another routine colonoscopy by mail and/or My Chart.  I appreciate the opportunity to care for you. Iva Boop, MD, FACG    YOU HAD AN ENDOSCOPIC PROCEDURE TODAY AT THE [facility]:   Refer to the procedure report that was given to you for any specific questions about what was found during the examination.  If the procedure report does not answer your questions, please call your gastroenterologist to clarify.  If you requested that your care partner not be given the details of your procedure findings, then the procedure report has been included in a sealed envelope for you to review at your convenience later.  YOU SHOULD EXPECT: Some feelings of bloating in the abdomen. Passage of more gas than usual.  Walking can help get rid of the air that was put into your GI tract during the procedure and reduce the bloating. If you had a lower endoscopy (such as a colonoscopy or flexible sigmoidoscopy) you may notice spotting of blood in your stool or on the toilet paper. If you underwent a bowel prep for your procedure, you may not have a normal bowel movement for a few days.  Please Note:  You might notice some irritation and congestion in your nose or some drainage.  This is from the oxygen used during your procedure.  There is no need for concern and it should clear up in a day or so.  SYMPTOMS TO REPORT IMMEDIATELY:  Following lower endoscopy (colonoscopy or flexible sigmoidoscopy):  Excessive amounts of blood in the stool  Significant tenderness or worsening of abdominal pains  Swelling of the abdomen that is new, acute  Fever of 100F or higher  For urgent or emergent issues, a gastroenterologist can be reached at any hour by calling [phone]. Do not use  MyChart messaging for urgent concerns.    DIET:  We do recommend a small meal at first, but then you may proceed to your regular diet.  Drink plenty of fluids but you should avoid alcoholic beverages for 24 hours.  ACTIVITY:  You should plan to take it easy for the rest of today and you should NOT DRIVE or use heavy machinery until tomorrow (because of the sedation medicines used during the test).    FOLLOW UP: Our staff will call the number listed on your records the next business day following your procedure.  We will call around 7:15- 8:00 am to check on you and address any questions or concerns that you may have regarding the information given to you following your procedure. If we do not reach you, we will leave a message.     If any biopsies were taken you will be contacted by phone or by letter within the next 1-3 weeks.  Please call us at [phone] if you have not heard about the biopsies in 3 weeks.    SIGNATURES/CONFIDENTIALITY: You and/or your care partner have signed paperwork which will be entered into your electronic medical record.  These signatures attest to the fact that that the information above on your After Visit Summary has been reviewed and is understood.  Full responsibility of the confidentiality of this discharge information lies with you and/or your care-partner.

## 2023-04-01 NOTE — Progress Notes (Signed)
Report to PACU, RN, vss, BBS= Clear.  

## 2023-04-02 ENCOUNTER — Telehealth: Payer: Self-pay

## 2023-04-02 NOTE — Telephone Encounter (Signed)
Follow up call placed, VM obtained and message left. 

## 2023-04-08 DIAGNOSIS — M25562 Pain in left knee: Secondary | ICD-10-CM | POA: Diagnosis not present

## 2023-04-08 DIAGNOSIS — M545 Low back pain, unspecified: Secondary | ICD-10-CM | POA: Diagnosis not present

## 2023-04-11 DIAGNOSIS — M4726 Other spondylosis with radiculopathy, lumbar region: Secondary | ICD-10-CM | POA: Diagnosis not present

## 2023-04-13 ENCOUNTER — Encounter: Payer: Self-pay | Admitting: Internal Medicine

## 2023-04-15 DIAGNOSIS — M4726 Other spondylosis with radiculopathy, lumbar region: Secondary | ICD-10-CM | POA: Diagnosis not present

## 2023-04-22 ENCOUNTER — Ambulatory Visit (INDEPENDENT_AMBULATORY_CARE_PROVIDER_SITE_OTHER): Payer: 59 | Admitting: Pulmonary Disease

## 2023-04-22 ENCOUNTER — Institutional Professional Consult (permissible substitution): Payer: 59 | Admitting: Pulmonary Disease

## 2023-04-22 ENCOUNTER — Encounter: Payer: Self-pay | Admitting: Pulmonary Disease

## 2023-04-22 VITALS — BP 104/64 | HR 69 | Ht 61.0 in | Wt 179.8 lb

## 2023-04-22 DIAGNOSIS — G479 Sleep disorder, unspecified: Secondary | ICD-10-CM | POA: Diagnosis not present

## 2023-04-22 DIAGNOSIS — R0981 Nasal congestion: Secondary | ICD-10-CM | POA: Diagnosis not present

## 2023-04-22 DIAGNOSIS — G4733 Obstructive sleep apnea (adult) (pediatric): Secondary | ICD-10-CM

## 2023-04-22 MED ORDER — ZOLPIDEM TARTRATE 10 MG PO TABS
10.0000 mg | ORAL_TABLET | Freq: Every evening | ORAL | 1 refills | Status: DC | PRN
Start: 2023-04-22 — End: 2023-11-21

## 2023-04-22 NOTE — Patient Instructions (Signed)
History of severe sleep apnea  -We will schedule you for a home sleep study  For chronic sinus congestion -Referral to ENT for evaluation -Try Flonase or other inhaled nasal spray 2 sprays each nostril, once a day  Continue weight loss efforts  Continue smoke cessation efforts  Tentative follow-up in about 3 months

## 2023-04-22 NOTE — Progress Notes (Signed)
Robin Small    [id number]    09/10/1961  Primary Care Physician:Garnette Gunner, MD  Referring Physician: Garnette Gunner, MD [address] [place],  [place] [address]  Chief complaint:   History of obstructive sleep apnea  HPI:  Patient with a past history of obstructive sleep apnea diagnosed in 2014 showing severe obstructive sleep apnea with severe oxygen desaturations Has not been using CPAP for the last year because of intolerance, sense of bloating, feeling gassy in the morning  She was compliant with CPAP and was benefiting from CPAP for long period of time, has had 2 machines so far Became less tolerant of weight over the last year -Wakes up gassy, wakes up with a sense of bloating  Usually goes to bed about 11, takes up to an hour to fall asleep Wakes up about once Final wake up time about 7 AM She does require Ambien to sleep and has used this for almost 10 years  Admits to dryness of her mouth in the morning Denies morning headaches Short-term memory is poor Long-term memory is good Focus is good  Has been exercising regularly to try and lose weight  Had knee surgery in 2020, did have some complications and was not able to ambulate well, gained some weight but has been losing weight since  Has chronic nasal congestion she has tried multiple interventions with no good relief  She is an active smoker smokes about 1/2 pack a day, smoked heavier in the past  Recently had a low-dose CT scan screening which showed lung nodules reportedly perifissural on the left, there is also an anterior right midlung nodule  She states she is able to walk up to a mile if she takes her time and will primarily stop because of pain in her knees  Outpatient Encounter Medications as of 04/22/2023  Medication Sig   acetaminophen (TYLENOL) 500 MG tablet Take 1,000 mg by mouth every 6 (six) hours as needed for moderate pain.   albuterol (PROAIR HFA) 108 (90  Base) MCG/ACT inhaler INHALE 2 PUFFS INTO THE LUNGS EVERY 6 HOURS AS NEEDED FOR WHEEZING OR SHORTNESS OF BREATH   azelastine (ASTELIN) 0.1 % nasal spray Place 1 spray into both nostrils 2 (two) times daily. Use in each nostril as directed   Calcium Carbonate-Vitamin D (CALCIUM-D) 600-400 MG-UNIT TABS Take 2 tablets by mouth at bedtime.   clobetasol cream (TEMOVATE) 0.05 % Apply 1 application topically 2 (two) times daily as needed (skin irritation).   diclofenac (VOLTAREN) 75 MG EC tablet Take 1 tablet (75 mg total) by mouth 2 (two) times daily as needed (pain).   diclofenac Sodium (VOLTAREN) 1 % GEL Apply 4 g topically 4 (four) times daily as needed (pain).   fluticasone (FLONASE) 50 MCG/ACT nasal spray Place 2 sprays into both nostrils daily.   levocetirizine (XYZAL) 5 MG tablet Take 5 mg by mouth daily.   montelukast (SINGULAIR) 10 MG tablet Take 1 tablet (10 mg total) by mouth at bedtime.   Multiple Vitamins-Minerals (AIRBORNE PO) Take 2 tablets by mouth daily.   phentermine (ADIPEX-P) 37.5 MG tablet Take 1 tablet (37.5 mg total) by mouth daily before breakfast.   tiZANidine (ZANAFLEX) 4 MG capsule tizanidine 4 mg capsule   zolpidem (AMBIEN) 10 MG tablet Take 1 tablet (10 mg total) by mouth at bedtime as needed for sleep. TAKE 1 TABLET(10 MG) BY MOUTH AT BEDTIME AS NEEDED FOR SLEEP Patient reports daily  12/24/2022   No facility-administered encounter medications on file as of 04/22/2023.    Allergies as of 04/22/2023 - Review Complete 04/22/2023  Allergen Reaction Noted   Codeine Nausea Only     Past Medical History:  Diagnosis Date   Anxiety    Depression    Diverticulosis 03/2016   Mild, noted on colonoscopy   High cholesterol    History of kidney stones    Lichen sclerosus    Anus   Low blood pressure reading    OA (osteoarthritis)    OSA on CPAP    Perianal cyst    Personal history of colonic polyps 04/06/2011   PONV (postoperative nausea and vomiting)    after c section,  did well after knee replacement   PPD positive    Age 62 >> reports taking medicine for one year   Pre-diabetes    per pt report   Seasonal allergies    Sleep apnea     Past Surgical History:  Procedure Laterality Date   ARTHROSCOPIC KNEE Left    CESAREAN SECTION     COLONOSCOPY W/ POLYPECTOMY  04/11/2016   ENDOMETRIAL ABLATION     GANGLION CYST EXCISION Right    HEMORROIDECTOMY     lichen sclerosus lesion excision     Anus   TOTAL KNEE ARTHROPLASTY Left 03/03/2019   Procedure: TOTAL KNEE ARTHROPLASTY;  Surgeon: Marcene Corning, MD;  Location: [facility];  Service: Orthopedics;  Laterality: Left;   TOTAL KNEE ARTHROPLASTY Right 09/15/2019   Procedure: RIGHT TOTAL KNEE ARTHROPLASTY;  Surgeon: Marcene Corning, MD;  Location: [facility];  Service: Orthopedics;  Laterality: Right;   TUBAL LIGATION      Family History  Problem Relation Age of Onset   Colon polyps Mother    Diabetes Mother    Hypertension Mother    Heart disease Mother    Hyperlipidemia Mother    Lung cancer Father    Alcohol abuse Brother    Hepatitis Brother        hep c   Breast cancer Maternal Grandmother        breast   Colon cancer Neg Hx    Esophageal cancer Neg Hx    Rectal cancer Neg Hx    Stomach cancer Neg Hx     Social History   Socioeconomic History   Marital status: Married    Spouse name: Not on file   Number of children: Not on file   Years of education: Not on file   Highest education level: Not on file  Occupational History   Occupation: internet support  Tobacco Use   Smoking status: Some Days    Packs/day: 0.50    Years: 42.00    Additional pack years: 0.00    Total pack years: 21.00    Types: Cigarettes    Last attempt to quit: 07/31/2019    Years since quitting: 3.7    Passive exposure: Never   Smokeless tobacco: Never   Tobacco comments:    1 pack per week on and off  Vaping Use   Vaping Use: Never used  Substance and Sexual Activity   Alcohol use: Yes    Alcohol/week: 0.0  standard drinks of alcohol    Comment: rare   Drug use: No   Sexual activity: Yes    Partners: Male    Birth control/protection: Post-menopausal, Surgical  Other Topics Concern   Not on file  Social History Narrative   Exercise-- daily for 1 hour  Social Determinants of Health   Financial Resource Strain: Not on file  Food Insecurity: Not on file  Transportation Needs: Not on file  Physical Activity: Not on file  Stress: Not on file  Social Connections: Not on file  Intimate Partner Violence: Not on file    Review of Systems  Respiratory:  Positive for apnea and shortness of breath.   Psychiatric/Behavioral:  Positive for sleep disturbance.     Vitals:   04/22/23 0852  BP: 104/64  Pulse: 69  SpO2: 96%     Physical Exam Constitutional:      Appearance: She is obese.  HENT:     Head: Normocephalic.     Mouth/Throat:     Mouth: Mucous membranes are moist.  Eyes:     General: No scleral icterus. Cardiovascular:     Rate and Rhythm: Normal rate and regular rhythm.     Heart sounds: No murmur heard.    No friction rub.  Pulmonary:     Effort: No respiratory distress.     Breath sounds: No stridor. No wheezing or rhonchi.  Musculoskeletal:     Cervical back: No rigidity or tenderness.  Neurological:     Mental Status: She is alert.  Psychiatric:        Mood and Affect: Mood normal.       04/22/2023    8:00 AM  Results of the Epworth flowsheet  Sitting and reading 0  Watching TV 0  Sitting, inactive in a public place (e.g. a theatre or a meeting) 0  As a passenger in a car for an hour without a break 0  Lying down to rest in the afternoon when circumstances permit 1  Sitting and talking to someone 0  Sitting quietly after a lunch without alcohol 0  In a car, while stopped for a few minutes in traffic 0  Total score 1     Data Reviewed: Previous sleep study reviewed showing severe obstructive sleep apnea with severe oxygen desaturations Patient was  titrated to CPAP of 10  Previous records reviewed showing good compliance  CT scan of the chest reviewed with the patient February 2024 showing a left perifissural nodule 3.5 mm, there is a nodule in the right midlung as well  Spirometry in March 2020 did reveal mild obstructive disease  Assessment:  Obstructive sleep apnea -Severe obstructive sleep apnea not treated at present  Active smoker -Counseled about the need to quit smoking  Chronic nasal congestion and stuffiness -On Singulair, Xyzal, Flonase, Astelin -She feels she may benefit from ENT evaluation and a referral will be made  Class I obesity -Continue weight loss efforts  Low-dose cancer screening CT showing lung nodules  Plan/Recommendations: Referral to ENT for evaluation for chronic nasal stuffiness and congestion  Weight loss efforts encouraged  Schedule patient for a home sleep study to assess severity of sleep disordered breathing and severity of oxygen desaturations  Risks associated with untreated sleep disordered breathing discussed with the patient  Risk of continuing to smoke discussed with the patient  Tentative follow-up in about 3 months  Encouraged to call with significant concerns   Virl Diamond MD [facility] 04/22/2023, 8:58 AM  CC: Garnette Gunner, MD

## 2023-04-23 ENCOUNTER — Other Ambulatory Visit: Payer: Self-pay | Admitting: Family Medicine

## 2023-04-23 DIAGNOSIS — J301 Allergic rhinitis due to pollen: Secondary | ICD-10-CM

## 2023-04-23 DIAGNOSIS — G8929 Other chronic pain: Secondary | ICD-10-CM

## 2023-04-23 NOTE — Telephone Encounter (Signed)
Chart supports rx. Last OV: 12/24/2022

## 2023-04-25 ENCOUNTER — Other Ambulatory Visit: Payer: Self-pay | Admitting: Pulmonary Disease

## 2023-04-25 ENCOUNTER — Encounter: Payer: Self-pay | Admitting: Pulmonary Disease

## 2023-04-25 DIAGNOSIS — G479 Sleep disorder, unspecified: Secondary | ICD-10-CM

## 2023-04-25 NOTE — Telephone Encounter (Addendum)
   Order was recently sent on 5/6  Patient requesting for the order to be sent to [facility] Robin Small

## 2023-04-25 NOTE — Telephone Encounter (Signed)
Mychart message sent by pt: Robin Small [facility] (supporting Adewale Renne Musca, MD)4 hours ago (11:11 AM)    Please call in zolpidem 10mg  to [facility]. I prefer 90 day supply   Dr. Val Eagle, please advise.

## 2023-04-29 DIAGNOSIS — Z1329 Encounter for screening for other suspected endocrine disorder: Secondary | ICD-10-CM | POA: Diagnosis not present

## 2023-04-29 DIAGNOSIS — Z131 Encounter for screening for diabetes mellitus: Secondary | ICD-10-CM | POA: Diagnosis not present

## 2023-04-29 DIAGNOSIS — Z1321 Encounter for screening for nutritional disorder: Secondary | ICD-10-CM | POA: Diagnosis not present

## 2023-04-29 DIAGNOSIS — N951 Menopausal and female climacteric states: Secondary | ICD-10-CM | POA: Diagnosis not present

## 2023-04-29 DIAGNOSIS — Z13220 Encounter for screening for lipoid disorders: Secondary | ICD-10-CM | POA: Diagnosis not present

## 2023-04-29 DIAGNOSIS — Z13228 Encounter for screening for other metabolic disorders: Secondary | ICD-10-CM | POA: Diagnosis not present

## 2023-04-30 ENCOUNTER — Encounter: Payer: Self-pay | Admitting: Family Medicine

## 2023-04-30 LAB — LAB REPORT - SCANNED
A1c: 6.3
EGFR: 88

## 2023-05-17 ENCOUNTER — Other Ambulatory Visit: Payer: Self-pay | Admitting: Family Medicine

## 2023-05-17 DIAGNOSIS — J301 Allergic rhinitis due to pollen: Secondary | ICD-10-CM

## 2023-05-17 NOTE — Telephone Encounter (Signed)
Chart supports rx. Last OV: 12/24/2022 Next OV: 05/27/2023

## 2023-05-22 ENCOUNTER — Other Ambulatory Visit: Payer: Self-pay | Admitting: Family Medicine

## 2023-05-22 DIAGNOSIS — G8929 Other chronic pain: Secondary | ICD-10-CM

## 2023-05-22 NOTE — Telephone Encounter (Signed)
Chart supports rx. Last OV: 12/24/2022 Next OV: 05/27/2023

## 2023-05-27 ENCOUNTER — Encounter: Payer: Self-pay | Admitting: Family Medicine

## 2023-05-27 ENCOUNTER — Ambulatory Visit (INDEPENDENT_AMBULATORY_CARE_PROVIDER_SITE_OTHER): Payer: 59 | Admitting: Family Medicine

## 2023-05-27 VITALS — BP 114/76 | HR 61 | Temp 97.8°F | Ht 61.0 in | Wt 177.8 lb

## 2023-05-27 DIAGNOSIS — E669 Obesity, unspecified: Secondary | ICD-10-CM

## 2023-05-27 DIAGNOSIS — J301 Allergic rhinitis due to pollen: Secondary | ICD-10-CM | POA: Diagnosis not present

## 2023-05-27 DIAGNOSIS — M25561 Pain in right knee: Secondary | ICD-10-CM | POA: Diagnosis not present

## 2023-05-27 DIAGNOSIS — E785 Hyperlipidemia, unspecified: Secondary | ICD-10-CM

## 2023-05-27 DIAGNOSIS — G8929 Other chronic pain: Secondary | ICD-10-CM | POA: Diagnosis not present

## 2023-05-27 DIAGNOSIS — Z6833 Body mass index (BMI) 33.0-33.9, adult: Secondary | ICD-10-CM | POA: Diagnosis not present

## 2023-05-27 DIAGNOSIS — E66811 Obesity, class 1: Secondary | ICD-10-CM

## 2023-05-27 DIAGNOSIS — Z0000 Encounter for general adult medical examination without abnormal findings: Secondary | ICD-10-CM

## 2023-05-27 DIAGNOSIS — M25572 Pain in left ankle and joints of left foot: Secondary | ICD-10-CM

## 2023-05-27 DIAGNOSIS — E119 Type 2 diabetes mellitus without complications: Secondary | ICD-10-CM | POA: Diagnosis not present

## 2023-05-27 DIAGNOSIS — M25562 Pain in left knee: Secondary | ICD-10-CM | POA: Diagnosis not present

## 2023-05-27 DIAGNOSIS — Z7984 Long term (current) use of oral hypoglycemic drugs: Secondary | ICD-10-CM

## 2023-05-27 HISTORY — DX: Type 2 diabetes mellitus without complications: E11.9

## 2023-05-27 LAB — LIPID PANEL
Cholesterol: 156 mg/dL (ref 0–200)
HDL: 45.8 mg/dL (ref >39.00)
LDL Cholesterol: 79 mg/dL (ref 0–99)
NonHDL: 110.67
Total CHOL/HDL Ratio: 3
Triglycerides: 160 mg/dL — ABNORMAL HIGH (ref 0.0–149.0)
VLDL: 32 mg/dL (ref 0.0–40.0)

## 2023-05-27 LAB — MICROALBUMIN / CREATININE URINE RATIO
Creatinine,U: 139.4 mg/dL
Microalb Creat Ratio: 0.5 mg/g (ref 0.0–30.0)
Microalb, Ur: <0.7 mg/dL (ref 0.0–1.9)

## 2023-05-27 MED ORDER — ATORVASTATIN CALCIUM 20 MG PO TABS
20.0000 mg | ORAL_TABLET | Freq: Every day | ORAL | 3 refills | Status: DC
Start: 2023-05-27 — End: 2024-03-06

## 2023-05-27 NOTE — Patient Instructions (Signed)
Continue using Voltaren gel for knee pain as recommended. Apply as directed and follow up with your orthopedic doctor if there's no improvement. Start Chantix to help with smoking cessation, as discussed. Follow the dosing schedule provided, and reach out if you experience any side effects. STake prescribed medications exactly as instructed. Don't stop or change your doses without consulting your healthcare provider. Engage in daily exercise and stay active. Consider working with a physical therapist to regain muscle strength and improve mobility post-surgery.

## 2023-05-27 NOTE — Progress Notes (Signed)
Assessment  Assessment/Plan:   Problem List Items Addressed This Visit       Respiratory   Seasonal allergic rhinitis due to pollen    Managed with Flonase and Azelastine nasal sprays, Montelukast.  Plan: Continue current medications.         Endocrine   Type 2 diabetes mellitus without complication, without long-term current use of insulin (HCC) - Primary    A1C at 6.3%, indicating well-controlled diabetes.  Plan: Monitor blood sugar levels and A1C every 6 months. Continue dietary modifications aimed at controlling blood sugar levels.      Relevant Medications   atorvastatin (LIPITOR) 20 MG tablet   Other Relevant Orders   Microalbumin / creatinine urine ratio     Other   Dyslipidemia    Elevated cholesterol levels, currently on atorvastatin.  Plan: Continue atorvastatin 20 mg. Repeat lipid panel today. Monitor cholesterol levels every 6 months.      Relevant Medications   atorvastatin (LIPITOR) 20 MG tablet   Other Relevant Orders   Lipid Profile   Class 1 obesity with serious comorbidity and body mass index (BMI) of 33.0 to 33.9 in adult    Patient is working on weight loss and mentioned past success with exercise.  Plan: Encourage continued exercise and dietary modifications. At future visit, discuss newer weight loss medications like Ozempic if current methods are insufficient.       Chronic pain of both knees    Patient continues to experience knee pain following bilateral knee replacements. Prefers Voltaren gel to oral medications. Knee pain has been persistent since surgery.  Plan: Continue use of Voltaren gel for localized pain relief. Monitor kidney function due to NSAID use. Recommend orthopedic follow-up for chronic pain assessment and consider physical therapy for strength and gait training. Refill Voltaren gel prescription.      Relevant Medications   gabapentin (NEURONTIN) 100 MG capsule   Chronic pain of left ankle    Patient  continues to experience chronic left ankle pain, managed with gabapentin.  Plan: Continue gabapentin 100 mg. Monitor for side effects and efficacy. Consider physical therapy for pain management.       Relevant Medications   gabapentin (NEURONTIN) 100 MG capsule   Other Visit Diagnoses     Encounter for well adult exam without abnormal findings           There are no discontinued medications.  Patient Counseling(The following topics were reviewed and/or handout was given):  -Nutrition: Stressed importance of moderation in sodium/caffeine intake, saturated fat and cholesterol, caloric balance, sufficient intake of fresh fruits, vegetables, and fiber.  -Stressed the importance of regular exercise.   -Substance Abuse: Discussed cessation/primary prevention of tobacco, alcohol, or other drug use; driving or other dangerous activities under the influence; availability of treatment for abuse.   -Injury prevention: Discussed safety belts, safety helmets, smoke detector, smoking near bedding or upholstery.   -Sexuality: Discussed sexually transmitted diseases, partner selection, use of condoms, avoidance of unintended pregnancy and contraceptive alternatives.   -Dental health: Discussed importance of regular tooth brushing, flossing, and dental visits.  -Health maintenance and immunizations reviewed. Please refer to Health maintenance section.  Return to care in 1 year for next preventative visit.       Subjective:  Chief complaint Encounter date: 05/27/2023  Chief Complaint  Patient presents with   Annual Exam    Fasting   Robin Small is a 62 y.o. female who presents today for her annual comprehensive physical exam.  History of Present Illness:  Chronic knee pain. It has persisted since bilateral knee replacements. The patient has been using Voltaren gel for localized pain relief, which has afforded some alleviation.   Night sweats. Have been present for the past 3-4  months. The patient reports significant sweating during the night, soaking the pillow. Recently started on an herbal estrogen supplement and gabapentin by OB/GYN which has afforded some relief.  Left ankle pain. Patient has been on gabapentin for nerve pain, particularly in her right ankle and foot. Reports intermittent numbness and tingling, with associated improvement in sleep quality.  Obesity: Reports generalized weight gain, attempts to lose weight through diet and exercise with limited success.   Review of Systems  Constitutional:  Positive for diaphoresis. Negative for chills, fever, malaise/fatigue and weight loss.  HENT:  Negative for congestion, ear discharge, ear pain and hearing loss.   Eyes:  Negative for blurred vision, double vision, photophobia, pain, discharge and redness.  Respiratory:  Negative for cough, sputum production, shortness of breath and wheezing.   Cardiovascular:  Negative for chest pain and palpitations.  Gastrointestinal:  Negative for abdominal pain, blood in stool, constipation, diarrhea, heartburn, melena, nausea and vomiting.  Genitourinary:  Negative for dysuria, flank pain, frequency, hematuria and urgency.  Musculoskeletal:  Positive for joint pain. Negative for myalgias.  Skin:  Negative for itching and rash.  Neurological:  Negative for dizziness, tingling, tremors, speech change, seizures, loss of consciousness, weakness and headaches.  Psychiatric/Behavioral:  Negative for depression, hallucinations, memory loss, substance abuse and suicidal ideas. The patient does not have insomnia.   All other systems reviewed and are negative.      05/27/2023    9:43 AM 12/24/2022   11:30 AM 05/25/2020   11:38 AM  GAD-7 Generalized Anxiety Disorder Screening Tool  1. Feeling Nervous, Anxious, or on Edge 2 1 0  2. Not Being Able to Stop or Control Worrying 1 0 0  3. Worrying Too Much About Different Things 2 0 0  4. Trouble Relaxing 3 0 0  5. Being So Restless  it's Hard To Sit Still 1 0 0  6. Becoming Easily Annoyed or Irritable 3 1 0  7. Feeling Afraid As If Something Awful Might Happen 0 0 0  Total GAD-7 Score 12 2 0  Difficulty At Work, Home, or Getting  Along With Others? Somewhat difficult Not difficult at all Not difficult at all      05/27/2023    9:43 AM 12/24/2022   11:30 AM 08/31/2022    4:43 PM 05/24/2022    9:21 AM 12/29/2021   10:31 AM 11/20/2021   11:02 AM 10/03/2020    2:29 PM  Depression screen PHQ 2/9  Decreased Interest 0 3 0 0 0 0 0  Down, Depressed, Hopeless 0 0 0 0 1 0 0  PHQ - 2 Score 0 3 0 0 1 0 0  Altered sleeping 3 3  0 3 0   Tired, decreased energy 1 0  0 3 0   Change in appetite 0 0  0 0 0   Feeling bad or failure about yourself  0 0  0 0 0   Trouble concentrating 1 0  0 2 0   Moving slowly or fidgety/restless 0 0  0 0 0   Suicidal thoughts 0 0  0 0 0   PHQ-9 Score 5 6  0 9 0   Difficult doing work/chores Somewhat difficult Not difficult at all  Not difficult  at all Somewhat difficult Not difficult at all     Health Maintenance Due  Topic Date Due   FOOT EXAM  Never done   OPHTHALMOLOGY EXAM  Never done   Diabetic kidney evaluation - Urine ACR  Never done   Zoster Vaccines- Shingrix (1 of 2) Never done   PAP SMEAR-Modifier  05/02/2018   MAMMOGRAM  04/03/2022     PMH:  The following were reviewed and entered/updated in epic: Past Medical History:  Diagnosis Date   Allergy    Anxiety    Asthma    Depression    Diverticulosis 03/2016   Mild, noted on colonoscopy   High cholesterol    History of kidney stones    Lichen sclerosus    Anus   Low blood pressure reading    OA (osteoarthritis)    OSA on CPAP    Perianal cyst    Personal history of colonic polyps 04/06/2011   PONV (postoperative nausea and vomiting)    after c section, did well after knee replacement   PPD positive    Age 5 >> reports taking medicine for one year   Pre-diabetes    per pt report   Seasonal allergies    Sleep apnea     Type 2 diabetes mellitus without complication, without long-term current use of insulin (HCC) 05/27/2023    Patient Active Problem List   Diagnosis Date Noted   Type 2 diabetes mellitus without complication, without long-term current use of insulin (HCC) 05/27/2023   Chronic pain of left ankle 05/27/2023   Hyperlipidemia 03/01/2023   Chronic pain of both knees 12/29/2022   Tobacco use disorder 12/29/2022   Hemorrhoids, internal 08/31/2022   Lichen sclerosus 05/24/2022   TMJ (temporomandibular joint syndrome) 05/24/2022   Seasonal allergic rhinitis due to pollen 05/24/2022   Class 1 obesity with serious comorbidity and body mass index (BMI) of 33.0 to 33.9 in adult 05/24/2022   Sleep disturbance 05/24/2022   Primary osteoarthritis of right knee 09/15/2019   Primary osteoarthritis of left knee 03/03/2019   Seasonal and perennial allergic rhinitis 02/13/2019   S/P hemorrhoidectomy 02/01/2017   Post-operative nausea and vomiting 01/24/2017   Sinusitis, acute 08/24/2013   Dyspnea 01/08/2013   Sleep apnea 01/08/2013   Muscle ache 02/08/2012   Personal history of colonic polyps 04/06/2011   EXTERNAL HEMORRHOIDS 12/27/2010   Mild intermittent asthma 06/03/2010   LUMBAR SPRAIN AND STRAIN 07/20/2008   ANXIETY DEPRESSION 06/28/2008   Dyslipidemia 07/10/2007    Past Surgical History:  Procedure Laterality Date   ARTHROSCOPIC KNEE Left    CESAREAN SECTION     COLONOSCOPY W/ POLYPECTOMY  04/11/2016   ENDOMETRIAL ABLATION     GANGLION CYST EXCISION Right    HEMORROIDECTOMY     JOINT REPLACEMENT  both knees 2020   lichen sclerosus lesion excision     Anus   TOTAL KNEE ARTHROPLASTY Left 03/03/2019   Procedure: TOTAL KNEE ARTHROPLASTY;  Surgeon: Marcene Corning, MD;  Location: [facility];  Service: Orthopedics;  Laterality: Left;   TOTAL KNEE ARTHROPLASTY Right 09/15/2019   Procedure: RIGHT TOTAL KNEE ARTHROPLASTY;  Surgeon: Marcene Corning, MD;  Location: [facility];  Service: Orthopedics;   Laterality: Right;   TUBAL LIGATION      Family History  Problem Relation Age of Onset   Colon polyps Mother    Diabetes Mother    Hypertension Mother    Heart disease Mother    Hyperlipidemia Mother    Arthritis Mother  Kidney disease Mother    Stroke Mother    Lung cancer Father    Varicose Veins Sister    Alcohol abuse Brother    Hepatitis Brother        hep c   Breast cancer Maternal Grandmother        breast   Colon cancer Neg Hx    Esophageal cancer Neg Hx    Rectal cancer Neg Hx    Stomach cancer Neg Hx     Medications- reviewed and updated Outpatient Medications Prior to Visit  Medication Sig Dispense Refill   acetaminophen (TYLENOL) 500 MG tablet Take 1,000 mg by mouth every 6 (six) hours as needed for moderate pain.     albuterol (PROAIR HFA) 108 (90 Base) MCG/ACT inhaler INHALE 2 PUFFS INTO THE LUNGS EVERY 6 HOURS AS NEEDED FOR WHEEZING OR SHORTNESS OF BREATH 8.5 g 0   azelastine (ASTELIN) 0.1 % nasal spray Place 1 spray into both nostrils 2 (two) times daily. Use in each nostril as directed 30 mL 12   Calcium Carbonate-Vitamin D (CALCIUM-D) 600-400 MG-UNIT TABS Take 2 tablets by mouth at bedtime.     Cholecalciferol 1.25 MG (50000 UT) capsule Take 1 capsule every week by oral route.     clobetasol cream (TEMOVATE) 0.05 % Apply 1 application topically 2 (two) times daily as needed (skin irritation).     diclofenac (VOLTAREN) 75 MG EC tablet Take 1 tablet (75 mg total) by mouth 2 (two) times daily as needed (pain). 180 tablet 1   diclofenac Sodium (VOLTAREN) 1 % GEL APPLY 4 GRAMS TOPICALLY TO THE AFFECTED AREA FOUR TIMES DAILY AS NEEDED FOR PAIN 300 g 0   fluticasone (FLONASE) 50 MCG/ACT nasal spray SHAKE LIQUID AND USE 2 SPRAYS IN EACH NOSTRIL DAILY 48 mL 1   gabapentin (NEURONTIN) 100 MG capsule TAKE 1 CAPSULE BY MOUTH EVERYDAY AT BEDTIME     levocetirizine (XYZAL) 5 MG tablet Take 5 mg by mouth daily.     montelukast (SINGULAIR) 10 MG tablet Take 1 tablet (10  mg total) by mouth at bedtime. 90 tablet 1   tiZANidine (ZANAFLEX) 4 MG capsule tizanidine 4 mg capsule     zolpidem (AMBIEN) 10 MG tablet Take 1 tablet (10 mg total) by mouth at bedtime as needed for sleep. TAKE 1 TABLET(10 MG) BY MOUTH AT BEDTIME AS NEEDED FOR SLEEP Patient reports daily 12/24/2022 90 tablet 1   Multiple Vitamins-Minerals (AIRBORNE PO) Take 2 tablets by mouth daily. (Patient not taking: Reported on 05/27/2023)     phentermine (ADIPEX-P) 37.5 MG tablet Take 1 tablet (37.5 mg total) by mouth daily before breakfast. (Patient not taking: Reported on 05/27/2023) 90 tablet 1   No facility-administered medications prior to visit.    Allergies  Allergen Reactions   Codeine Nausea Only    Social History   Socioeconomic History   Marital status: Married    Spouse name: Not on file   Number of children: Not on file   Years of education: Not on file   Highest education level: Not on file  Occupational History   Occupation: internet support  Tobacco Use   Smoking status: Some Days    Packs/day: 0.50    Years: 30.00    Additional pack years: 0.00    Total pack years: 15.00    Types: Cigarettes    Last attempt to quit: 07/31/2019    Years since quitting: 3.8    Passive exposure: Never   Smokeless tobacco: Never  Tobacco comments:    1 pack per week on and off  Vaping Use   Vaping Use: Never used  Substance and Sexual Activity   Alcohol use: Not Currently    Comment: Maybe once a month   Drug use: No   Sexual activity: Yes    Partners: Male    Birth control/protection: Post-menopausal, Surgical  Other Topics Concern   Not on file  Social History Narrative   Exercise-- daily for 1 hour   Social Determinants of Health   Financial Resource Strain: Not on file  Food Insecurity: Not on file  Transportation Needs: Not on file  Physical Activity: Not on file  Stress: Not on file  Social Connections: Not on file        Objective:  Physical Exam: BP 114/76 (BP  Location: Left Arm, Patient Position: Sitting, Cuff Size: Large)   Pulse 61   Temp 97.8 F (36.6 C) (Temporal)   Ht 5\' 1"  (1.549 m)   Wt 177 lb 12.8 oz (80.6 kg)   SpO2 96%   BMI 33.60 kg/m   Body mass index is 33.6 kg/m. Wt Readings from Last 3 Encounters:  05/27/23 177 lb 12.8 oz (80.6 kg)  04/22/23 179 lb 12.8 oz (81.6 kg)  04/01/23 180 lb (81.6 kg)    Physical Exam Constitutional:      General: She is not in acute distress.    Appearance: Normal appearance. She is not ill-appearing or toxic-appearing.  HENT:     Head: Normocephalic and atraumatic.     Right Ear: Hearing, tympanic membrane, ear canal and external ear normal. There is no impacted cerumen.     Left Ear: Hearing, tympanic membrane, ear canal and external ear normal. There is no impacted cerumen.     Nose: Nose normal. No congestion.     Mouth/Throat:     Lips: No lesions.     Mouth: Mucous membranes are moist.     Pharynx: Oropharynx is clear. No oropharyngeal exudate.  Eyes:     General: No scleral icterus.       Right eye: No discharge.        Left eye: No discharge.     Conjunctiva/sclera: Conjunctivae normal.     Pupils: Pupils are equal, round, and reactive to light.  Neck:     Thyroid: No thyroid mass, thyromegaly or thyroid tenderness.  Cardiovascular:     Rate and Rhythm: Normal rate and regular rhythm.     Pulses: Normal pulses.     Heart sounds: Normal heart sounds.  Pulmonary:     Effort: Pulmonary effort is normal. No respiratory distress.     Breath sounds: Normal breath sounds.  Abdominal:     General: Abdomen is flat. Bowel sounds are normal.     Palpations: Abdomen is soft.  Musculoskeletal:        General: Normal range of motion.     Cervical back: Normal range of motion.     Right lower leg: No edema.     Left lower leg: Edema (left ankle medial, mildly tender, no erythema) present.  Lymphadenopathy:     Cervical: No cervical adenopathy.  Skin:    General: Skin is warm and  dry.     Findings: No rash.  Neurological:     General: No focal deficit present.     Mental Status: She is alert and oriented to person, place, and time. Mental status is at baseline.     Deep Tendon Reflexes:  Reflex Scores:      Patellar reflexes are 2+ on the right side and 2+ on the left side. Psychiatric:        Mood and Affect: Mood normal.        Behavior: Behavior normal.        Thought Content: Thought content normal.        Judgment: Judgment normal.         Outside Lab Results: 04/30/2023  Vitamin D: Low.  Started on supplementation by OB/GYN.  Hemoglobin A1C: 6.3.    Lipid Panel: Significant elevation throughout.  Patient reports that she was not fasting although this was indicated on the recent lab work.    Comprehensive Metabolic Panel: Grossly normal.  GFR 88.    TSH: 1.290 IU/mL  At today's visit, we discussed treatment options, associated risk and benefits, and engage in counseling as needed.  Additionally the following were reviewed: Past medical records, past medical and surgical history, family and social background, as well as relevant laboratory results, imaging findings, and specialty notes, where applicable.  This message was generated using dictation software, and as a result, it may contain unintentional typos or errors.  Nevertheless, extensive effort was made to accurately convey at the pertinent aspects of the patient visit.    There may have been are other unrelated non-urgent complaints, but due to the busy schedule and the amount of time already spent with her, time does not permit to address these issues at today's visit. Another appointment may have or has been requested to review these additional issues.   Thomes Dinning, MD, MS

## 2023-05-27 NOTE — Assessment & Plan Note (Signed)
Patient is working on weight loss and mentioned past success with exercise.  Plan: Encourage continued exercise and dietary modifications. At future visit, discuss newer weight loss medications like Ozempic if current methods are insufficient.

## 2023-05-27 NOTE — Assessment & Plan Note (Addendum)
A1C at 6.3%, indicating well-controlled diabetes.  Plan: Monitor blood sugar levels and A1C every 6 months. Continue dietary modifications aimed at controlling blood sugar levels.

## 2023-05-27 NOTE — Assessment & Plan Note (Signed)
Patient continues to experience knee pain following bilateral knee replacements. Prefers Voltaren gel to oral medications. Knee pain has been persistent since surgery.  Plan: Continue use of Voltaren gel for localized pain relief. Monitor kidney function due to NSAID use. Recommend orthopedic follow-up for chronic pain assessment and consider physical therapy for strength and gait training. Refill Voltaren gel prescription.

## 2023-05-27 NOTE — Assessment & Plan Note (Signed)
Elevated cholesterol levels, currently on atorvastatin.  Plan: Continue atorvastatin 20 mg. Repeat lipid panel today. Monitor cholesterol levels every 6 months.

## 2023-05-27 NOTE — Assessment & Plan Note (Signed)
Patient continues to experience chronic left ankle pain, managed with gabapentin.  Plan: Continue gabapentin 100 mg. Monitor for side effects and efficacy. Consider physical therapy for pain management.

## 2023-05-27 NOTE — Assessment & Plan Note (Signed)
Managed with Flonase and Azelastine nasal sprays, Montelukast.  Plan: Continue current medications.

## 2023-06-04 ENCOUNTER — Encounter: Payer: Self-pay | Admitting: Pulmonary Disease

## 2023-06-04 DIAGNOSIS — G4733 Obstructive sleep apnea (adult) (pediatric): Secondary | ICD-10-CM

## 2023-06-18 ENCOUNTER — Other Ambulatory Visit: Payer: Self-pay | Admitting: Family Medicine

## 2023-06-18 NOTE — Telephone Encounter (Signed)
Chart supports rx. Last OV: 05/27/2023 Next OV: 12/02/2023

## 2023-07-30 ENCOUNTER — Other Ambulatory Visit: Payer: Self-pay | Admitting: Family Medicine

## 2023-07-30 DIAGNOSIS — G8929 Other chronic pain: Secondary | ICD-10-CM

## 2023-08-04 ENCOUNTER — Encounter: Payer: Self-pay | Admitting: Family Medicine

## 2023-08-04 ENCOUNTER — Other Ambulatory Visit: Payer: Self-pay | Admitting: Family Medicine

## 2023-08-04 DIAGNOSIS — J301 Allergic rhinitis due to pollen: Secondary | ICD-10-CM

## 2023-08-04 DIAGNOSIS — E669 Obesity, unspecified: Secondary | ICD-10-CM

## 2023-08-06 MED ORDER — PHENTERMINE HCL 37.5 MG PO TABS
37.5000 mg | ORAL_TABLET | Freq: Every day | ORAL | 1 refills | Status: DC
Start: 2023-08-06 — End: 2023-08-06

## 2023-08-06 MED ORDER — PHENTERMINE HCL 37.5 MG PO TABS
37.5000 mg | ORAL_TABLET | Freq: Every day | ORAL | 0 refills | Status: DC
Start: 2023-08-06 — End: 2023-09-27

## 2023-08-06 MED ORDER — MONTELUKAST SODIUM 10 MG PO TABS
10.0000 mg | ORAL_TABLET | Freq: Every day | ORAL | 1 refills | Status: DC
Start: 2023-08-06 — End: 2024-01-17

## 2023-08-09 ENCOUNTER — Telehealth: Payer: Self-pay | Admitting: Pulmonary Disease

## 2023-08-09 DIAGNOSIS — G479 Sleep disorder, unspecified: Secondary | ICD-10-CM

## 2023-08-09 DIAGNOSIS — G4733 Obstructive sleep apnea (adult) (pediatric): Secondary | ICD-10-CM

## 2023-08-09 NOTE — Telephone Encounter (Signed)
Patient is calling because her insurance will not cover an at home sleep study but it will cover an in facility sleep study. Please call and advise to set up an appointment [phone].

## 2023-08-09 NOTE — Telephone Encounter (Signed)
We will need an order placed

## 2023-08-12 ENCOUNTER — Ambulatory Visit: Payer: 59 | Admitting: Family Medicine

## 2023-08-12 ENCOUNTER — Telehealth: Payer: Self-pay | Admitting: Family Medicine

## 2023-08-12 NOTE — Telephone Encounter (Signed)
[facility] transportation letter sent

## 2023-08-12 NOTE — Telephone Encounter (Signed)
 1st no show, letter sent via mail

## 2023-08-16 ENCOUNTER — Other Ambulatory Visit: Payer: Self-pay | Admitting: Pulmonary Disease

## 2023-08-16 DIAGNOSIS — G4733 Obstructive sleep apnea (adult) (pediatric): Secondary | ICD-10-CM

## 2023-08-16 NOTE — Telephone Encounter (Signed)
New oder placed.

## 2023-08-16 NOTE — Telephone Encounter (Signed)
Lmam for patient to make her aware that we got the order but have to get auth first to schedule

## 2023-08-26 ENCOUNTER — Telehealth: Payer: 59 | Admitting: Physician Assistant

## 2023-08-26 DIAGNOSIS — K0510 Chronic gingivitis, plaque induced: Secondary | ICD-10-CM | POA: Diagnosis not present

## 2023-08-26 DIAGNOSIS — S01511A Laceration without foreign body of lip, initial encounter: Secondary | ICD-10-CM | POA: Diagnosis not present

## 2023-08-26 MED ORDER — AMOXICILLIN 400 MG/5ML PO SUSR
400.0000 mg | Freq: Two times a day (BID) | ORAL | 0 refills | Status: DC
Start: 2023-08-26 — End: 2023-09-27

## 2023-08-26 MED ORDER — TRIAMCINOLONE ACETONIDE 0.1 % MT PSTE: 1.0000 | PASTE | Freq: Two times a day (BID) | OROMUCOSAL | 12 refills | Status: AC

## 2023-08-26 NOTE — Patient Instructions (Signed)
Robin Small, thank you for joining Margaretann Loveless, PA-C for today's virtual visit.  While this provider is not your primary care provider (PCP), if your PCP is located in our provider database this encounter information will be shared with them immediately following your visit.   A [facility] MyChart account gives you access to today's visit and all your visits, tests, and labs performed at [facility] " click here if you don't have a [facility] MyChart account or go to [email or web address]  Consent: (Patient) Robin Small provided verbal consent for this virtual visit at the beginning of the encounter.  Current Medications:  Current Outpatient Medications:    amoxicillin (AMOXIL) 400 MG/5ML suspension, Take 5 mLs (400 mg total) by mouth 2 (two) times daily., Disp: 100 mL, Rfl: 0   triamcinolone (KENALOG) 0.1 % paste, Use as directed 1 Application in the mouth or throat 2 (two) times daily., Disp: 5 g, Rfl: 12   acetaminophen (TYLENOL) 500 MG tablet, Take 1,000 mg by mouth every 6 (six) hours as needed for moderate pain., Disp: , Rfl:    albuterol (PROAIR HFA) 108 (90 Base) MCG/ACT inhaler, INHALE 2 PUFFS INTO THE LUNGS EVERY 6 HOURS AS NEEDED FOR WHEEZING OR SHORTNESS OF BREATH, Disp: 8.5 g, Rfl: 0   atorvastatin (LIPITOR) 20 MG tablet, Take 1 tablet (20 mg total) by mouth daily., Disp: 90 tablet, Rfl: 3   azelastine (ASTELIN) 0.1 % nasal spray, Place 1 spray into both nostrils 2 (two) times daily. Use in each nostril as directed, Disp: 30 mL, Rfl: 12   Calcium Carbonate-Vitamin D (CALCIUM-D) 600-400 MG-UNIT TABS, Take 2 tablets by mouth at bedtime., Disp: , Rfl:    Cholecalciferol 1.25 MG (50000 UT) capsule, Take 1 capsule every week by oral route., Disp: , Rfl:    clobetasol cream (TEMOVATE) 0.05 %, Apply 1 application topically 2 (two) times daily as needed (skin irritation)., Disp: , Rfl:    diclofenac (VOLTAREN) 75 MG EC tablet, TAKE 1 TABLET BY MOUTH TWICE A  DAY AS NEEDED FOR PAIN, Disp: 60 tablet, Rfl: 2   diclofenac Sodium (VOLTAREN) 1 % GEL, APPLY 4 GRAMS TOPICALLY TO THE AFFECTED AREA FOUR TIMES DAILY AS NEEDED FOR PAIN, Disp: 300 g, Rfl: 0   fluticasone (FLONASE) 50 MCG/ACT nasal spray, SHAKE LIQUID AND USE 2 SPRAYS IN EACH NOSTRIL DAILY, Disp: 48 mL, Rfl: 1   gabapentin (NEURONTIN) 100 MG capsule, TAKE 1 CAPSULE BY MOUTH EVERYDAY AT BEDTIME, Disp: , Rfl:    levocetirizine (XYZAL) 5 MG tablet, Take 5 mg by mouth daily., Disp: , Rfl:    montelukast (SINGULAIR) 10 MG tablet, Take 1 tablet (10 mg total) by mouth at bedtime., Disp: 90 tablet, Rfl: 1   Multiple Vitamins-Minerals (AIRBORNE PO), Take 2 tablets by mouth daily. (Patient not taking: Reported on 05/27/2023), Disp: , Rfl:    phentermine (ADIPEX-P) 37.5 MG tablet, Take 1 tablet (37.5 mg total) by mouth daily before breakfast., Disp: 90 tablet, Rfl: 0   tiZANidine (ZANAFLEX) 4 MG capsule, tizanidine 4 mg capsule, Disp: , Rfl:    zolpidem (AMBIEN) 10 MG tablet, Take 1 tablet (10 mg total) by mouth at bedtime as needed for sleep. TAKE 1 TABLET(10 MG) BY MOUTH AT BEDTIME AS NEEDED FOR SLEEP Patient reports daily 12/24/2022, Disp: 90 tablet, Rfl: 1   Medications ordered in this encounter:  Meds ordered this encounter  Medications   triamcinolone (KENALOG) 0.1 % paste    Sig: Use as directed 1  Application in the mouth or throat 2 (two) times daily.    Dispense:  5 g    Refill:  12    Order Specific Question:   Supervising Provider    Answer:   Merrilee Jansky [[id number]]   amoxicillin (AMOXIL) 400 MG/5ML suspension    Sig: Take 5 mLs (400 mg total) by mouth 2 (two) times daily.    Dispense:  100 mL    Refill:  0    Order Specific Question:   Supervising Provider    Answer:   Merrilee Jansky [[id number]]     *If you need refills on other medications prior to your next appointment, please contact your pharmacy*  Follow-Up: Call back or seek an in-person evaluation if the symptoms worsen or  if the condition fails to improve as anticipated.  [facility] [phone]  Other Instructions Gingivitis Gingivitis is inflammation of the gums. It can cause redness, soreness, bleeding, and swelling of the gums. This condition is usually mild and clears up with improved home care or treatment from a dental care provider. If left untreated, gingivitis can get worse and lead to other problems with the teeth and gums. What are the causes? This condition is usually caused by a buildup of a sticky substance called plaque. Plaque is made up of mucus, bacteria, and food particles. When plaque builds up, it reacts with the saliva in the mouth to form a hard deposit called tartar or calculus, which becomes trapped around the base of the tooth. Plaque and tartar cause irritation of the gums, as well as a breeding ground for bacteria, that leads to gingivitis. Gingivitis usually results from poor home care and not having regular dental cleaning of the mouth and teeth (oral hygiene). What increases the risk? The following factors may make you more likely to develop this condition: Not practicing proper oral hygiene. Eating a diet that does not provide proper nutrition. Taking certain medicines. Being pregnant. Going through puberty or menopause. Using products that contain nicotine and tobacco. Having certain medical conditions, such as: Diabetes. Some viral or fungal infections. Dry mouth. Wearing dental appliances that do not fit properly. What are the signs or symptoms? Symptoms of this condition include: Gums that bleed easily, especially during flossing or brushing. Swollen gums. Gums that are bright red or purple. Receding gums. This means that the gums are wearing away from the teeth so that more of the tooth is exposed. Bad breath. How is this diagnosed? This condition is diagnosed with a medical history and an examination of the teeth and gums. You will also need  X-rays to see if the inflammation has spread to the supporting structures of the teeth. How is this treated? This condition may be treated by: Having your teeth and gums cleaned at a dental care provider's office to have the plaque and tartar removed. Practicing good oral hygiene at home. This includes careful brushing and regular flossing. Using a mouthwash. In some cases, a mouthwash may be prescribed or recommended. In more advanced cases, this condition may be treated with antibiotic medicine or surgery. Follow these instructions at home:     Follow instructions from your dental care provider about how to clean your teeth. Make sure you: Brush your teeth twice a day using a soft-bristled toothbrush. Floss at least one time each day. It is best to floss before you brush your teeth. Use a mouthwash as recommended by your dental care provider Eat a well-balanced diet.  Limit your intake of foods and beverages that contain sugar between meals. See a dental care provider on a regular basis for cleaning and checkups. Do not use any products that contain nicotine or tobacco, such as cigarettes, e-cigarettes, and chewing tobacco. If you need help quitting, ask your dental care provider. If you were prescribed an antibiotic medicine, take it as told by your dental care provider. Do not stop using the antibiotic even if you start to feel better. Keep all follow-up visits. This is important. Contact a health care provider if: You have a fever. You have a lot of bleeding from your gums. You have pain in your gums or teeth. You have difficulty chewing. You have any loose or infected teeth. You have swollen glands in your face or neck. Summary Gingivitis is inflammation of the gums. It can cause redness, soreness, bleeding, and swelling of the gums. This condition is usually mild and clears up with treatment. Follow instructions from your dental care provider about how to clean your teeth. Use a  mouthwash as told by your dental care provider. Contact a dental care provider if you have new or worsening symptoms. Keep all follow-up visits. This is important. This information is not intended to replace advice given to you by your health care provider. Make sure you discuss any questions you have with your health care provider. Document Revised: 05/04/2021 Document Reviewed: 05/04/2021 Elsevier Patient Education  2024 Elsevier Inc.    If you have been instructed to have an in-person evaluation today at a local Urgent Care facility, please use the link below. It will take you to a list of all of our available [facility], including address, phone number and hours of operation. Please do not delay care.  [facility]  If you or a family member do not have a primary care provider, use the link below to schedule a visit and establish care. When you choose a [facility] primary care physician or advanced practice provider, you gain a long-term partner in health. Find a Primary Care Provider  Learn more about [facility]'s in-office and virtual care options: [facility] - Get Care Now
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
## 2023-09-04 ENCOUNTER — Telehealth: Payer: Self-pay | Admitting: Pulmonary Disease

## 2023-09-04 NOTE — Telephone Encounter (Signed)
Patient needs to be scheduled for a sleep study. She had an insurance that didn't cover it but now she has a new insurance.  [phone]

## 2023-09-11 ENCOUNTER — Encounter: Payer: Self-pay | Admitting: Pulmonary Disease

## 2023-09-27 ENCOUNTER — Ambulatory Visit (INDEPENDENT_AMBULATORY_CARE_PROVIDER_SITE_OTHER): Payer: 59 | Admitting: Family Medicine

## 2023-09-27 ENCOUNTER — Other Ambulatory Visit: Payer: Self-pay | Admitting: Family Medicine

## 2023-09-27 ENCOUNTER — Encounter: Payer: Self-pay | Admitting: Family Medicine

## 2023-09-27 VITALS — BP 130/84 | HR 50 | Temp 97.5°F | Wt 178.4 lb

## 2023-09-27 DIAGNOSIS — E6609 Other obesity due to excess calories: Secondary | ICD-10-CM | POA: Diagnosis not present

## 2023-09-27 DIAGNOSIS — E1149 Type 2 diabetes mellitus with other diabetic neurological complication: Secondary | ICD-10-CM

## 2023-09-27 DIAGNOSIS — M25561 Pain in right knee: Secondary | ICD-10-CM | POA: Diagnosis not present

## 2023-09-27 DIAGNOSIS — M1712 Unilateral primary osteoarthritis, left knee: Secondary | ICD-10-CM

## 2023-09-27 DIAGNOSIS — M25572 Pain in left ankle and joints of left foot: Secondary | ICD-10-CM

## 2023-09-27 DIAGNOSIS — J45909 Unspecified asthma, uncomplicated: Secondary | ICD-10-CM | POA: Insufficient documentation

## 2023-09-27 DIAGNOSIS — Z6833 Body mass index (BMI) 33.0-33.9, adult: Secondary | ICD-10-CM

## 2023-09-27 DIAGNOSIS — M5442 Lumbago with sciatica, left side: Secondary | ICD-10-CM

## 2023-09-27 DIAGNOSIS — G8929 Other chronic pain: Secondary | ICD-10-CM | POA: Diagnosis not present

## 2023-09-27 DIAGNOSIS — M25562 Pain in left knee: Secondary | ICD-10-CM | POA: Diagnosis not present

## 2023-09-27 DIAGNOSIS — Z7985 Long-term (current) use of injectable non-insulin antidiabetic drugs: Secondary | ICD-10-CM

## 2023-09-27 DIAGNOSIS — J4540 Moderate persistent asthma, uncomplicated: Secondary | ICD-10-CM

## 2023-09-27 DIAGNOSIS — E66811 Obesity, class 1: Secondary | ICD-10-CM

## 2023-09-27 LAB — POCT GLYCOSYLATED HEMOGLOBIN (HGB A1C): Hemoglobin A1C: 6.4 % — AB (ref 4.0–5.6)

## 2023-09-27 MED ORDER — LEVOCETIRIZINE DIHYDROCHLORIDE 5 MG PO TABS
5.0000 mg | ORAL_TABLET | Freq: Every evening | ORAL | 3 refills | Status: DC
Start: 2023-09-27 — End: 2024-05-08

## 2023-09-27 MED ORDER — PHENTERMINE HCL 37.5 MG PO TABS
37.5000 mg | ORAL_TABLET | Freq: Every day | ORAL | 1 refills | Status: DC
Start: 2023-09-27 — End: 2024-08-21

## 2023-09-27 MED ORDER — OZEMPIC (0.25 OR 0.5 MG/DOSE) 2 MG/3ML ~~LOC~~ SOPN
1.0000 | PEN_INJECTOR | SUBCUTANEOUS | 0 refills | Status: DC
Start: 2023-09-27 — End: 2023-10-01

## 2023-09-27 MED ORDER — ALBUTEROL SULFATE HFA 108 (90 BASE) MCG/ACT IN AERS
INHALATION_SPRAY | RESPIRATORY_TRACT | 0 refills | Status: DC
Start: 2023-09-27 — End: 2024-01-24

## 2023-09-27 MED ORDER — METOCLOPRAMIDE HCL 5 MG PO TABS: 5.0000 mg | ORAL_TABLET | Freq: Three times a day (TID) | ORAL | 0 refills | Status: AC | PRN

## 2023-09-27 NOTE — Assessment & Plan Note (Signed)
Initiate Ozempic for weight management in addition to current regimen. Monitor weight and symptoms every 3 months. Discuss dietary habits and encourage exercise.

## 2023-09-27 NOTE — Assessment & Plan Note (Signed)
Continue to monitor HbA1c levels. Start Ozempic 0.25 mg once weekly for weight management. Discuss potential side effects and prescribe Metoclopramide (Reglan) if nausea occurs. Mild neuropathy in left great toe.  Continue to monitor.  Counseled on diabetic footcare.

## 2023-09-27 NOTE — Progress Notes (Signed)
Assessment/Plan:   Problem List Items Addressed This Visit       Respiratory   Extrinsic asthma   Relevant Medications   levocetirizine (XYZAL) 5 MG tablet   albuterol (PROAIR HFA) 108 (90 Base) MCG/ACT inhaler     Endocrine   Type 2 diabetes mellitus with neurological complications (HCC) - Primary    Continue to monitor HbA1c levels. Start Ozempic 0.25 mg once weekly for weight management. Discuss potential side effects and prescribe Metoclopramide (Reglan) if nausea occurs. Mild neuropathy in left great toe.  Continue to monitor.  Counseled on diabetic footcare.      Relevant Medications   metoCLOPramide (REGLAN) 5 MG tablet   Semaglutide,0.25 or 0.5MG /DOS, (OZEMPIC, 0.25 OR 0.5 MG/DOSE,) 2 MG/3ML SOPN   Other Relevant Orders   POCT glycosylated hemoglobin (Hb A1C) (Completed)     Nervous and Auditory   Chronic midline low back pain with left-sided sciatica    Follow up with orthopedics for potential further imaging or intervention. Encourage weight management for symptomatic relief. Consider non-pharmacological interventions for pain.      Relevant Medications   phentermine (ADIPEX-P) 37.5 MG tablet     Musculoskeletal and Integument   Primary osteoarthritis of left knee (Chronic)     Other   Class 1 obesity with serious comorbidity and body mass index (BMI) of 33.0 to 33.9 in adult    Initiate Ozempic for weight management in addition to current regimen. Monitor weight and symptoms every 3 months. Discuss dietary habits and encourage exercise.      Relevant Medications   phentermine (ADIPEX-P) 37.5 MG tablet   Semaglutide,0.25 or 0.5MG /DOS, (OZEMPIC, 0.25 OR 0.5 MG/DOSE,) 2 MG/3ML SOPN   Chronic pain of both knees   Chronic pain of left ankle   Other Visit Diagnoses     Obesity (BMI 30.0-34.9)       Relevant Medications   phentermine (ADIPEX-P) 37.5 MG tablet       Medications Discontinued During This Encounter  Medication Reason   amoxicillin  (AMOXIL) 400 MG/5ML suspension    albuterol (PROAIR HFA) 108 (90 Base) MCG/ACT inhaler Reorder   levocetirizine (XYZAL) 5 MG tablet Reorder   phentermine (ADIPEX-P) 37.5 MG tablet Reorder    No follow-ups on file.    Subjective:   Encounter date: 09/27/2023  Robin Small is a 62 y.o. female who has Dyslipidemia; ANXIETY DEPRESSION; Mild intermittent asthma; LUMBAR SPRAIN AND STRAIN; External hemorrhoids; Muscle ache; Dyspnea; Sleep apnea; Sinusitis, acute; History of colonic polyps; Seasonal and perennial allergic rhinitis; Primary osteoarthritis of left knee; Primary osteoarthritis of right knee; Lichen sclerosus; S/P hemorrhoidectomy; TMJ (temporomandibular joint syndrome); Seasonal allergic rhinitis due to pollen; Class 1 obesity with serious comorbidity and body mass index (BMI) of 33.0 to 33.9 in adult; Sleep disturbance; Hemorrhoids, internal; Chronic pain of both knees; Tobacco use disorder; Hyperlipidemia; Post-operative nausea and vomiting; Type 2 diabetes mellitus without complication, without long-term current use of insulin (HCC); Chronic pain of left ankle; Chronic midline low back pain with left-sided sciatica; Extrinsic asthma; and Type 2 diabetes mellitus with neurological complications (HCC) on their problem list..   She  has a past medical history of Allergy, Anxiety, Asthma, Depression, Diverticulosis (03/2016), High cholesterol, History of kidney stones, Lichen sclerosus, Low blood pressure reading, OA (osteoarthritis), OSA on CPAP, Perianal cyst, Personal history of colonic polyps (04/06/2011), PONV (postoperative nausea and vomiting), PPD positive, Pre-diabetes, Seasonal allergies, Sleep apnea, and Type 2 diabetes mellitus without complication, without long-term current use of insulin (  HCC) (05/27/2023)..   Chief Complaint: Follow-up for diabetes, weight management options, left-sided leg, ankle, and back pain.  History of Present Illness:  Diabetes Mellitus. The  patient's HbA1c was 6.4, with stable glycemic control lifestyle modifications. The patient is currently on phentermine 37.5 mg for weight management but reports minimal progress.  She expressed interest inswitching to Ozempic.  Her husband is on this, but she expresses concern about GI side effects, especially nausea.  These are the once her husband experiences for better efficacy. S  Pain Management (leg, ankle, back). The patient reports consistent left-sided leg and ankle pain, originating potentially from nerve involvement secondary to lower back pathology, likely sciatica. History of left-sided back pain and a previous steroid injection in 2021 provided temporary relief. Orthopedic evaluations suggested nerve involvement. The pain persists despite the absence of overt swelling and is constant regardless of walking. The patient also reports diffuse pain in the elbows, lower back, and mild swelling noticed on the right shoulder. E  Review of Systems  Constitutional:  Negative for chills, diaphoresis, fever, malaise/fatigue and weight loss.  HENT:  Negative for congestion, ear discharge, ear pain and hearing loss.   Eyes:  Negative for blurred vision, double vision, photophobia, pain, discharge and redness.  Respiratory:  Negative for cough, sputum production, shortness of breath and wheezing.   Cardiovascular:  Negative for chest pain, palpitations and leg swelling.  Gastrointestinal:  Negative for abdominal pain, blood in stool, constipation, diarrhea, heartburn, melena, nausea and vomiting.  Genitourinary:  Negative for dysuria, flank pain, frequency, hematuria and urgency.  Musculoskeletal:  Positive for back pain and joint pain. Negative for myalgias.  Skin:  Negative for itching and rash.  Neurological:  Positive for tingling and sensory change. Negative for dizziness, tremors, speech change, seizures, loss of consciousness, weakness and headaches.  Endo/Heme/Allergies:  Positive for  environmental allergies (controlled, needs refill of xyzal). Negative for polydipsia.  Psychiatric/Behavioral:  Negative for depression, hallucinations, memory loss, substance abuse and suicidal ideas.   All other systems reviewed and are negative.   Past Surgical History:  Procedure Laterality Date   ARTHROSCOPIC KNEE Left    CESAREAN SECTION     COLONOSCOPY W/ POLYPECTOMY  04/11/2016   ENDOMETRIAL ABLATION     GANGLION CYST EXCISION Right    HEMORROIDECTOMY     JOINT REPLACEMENT  both knees 2020   lichen sclerosus lesion excision     Anus   TOTAL KNEE ARTHROPLASTY Left 03/03/2019   Procedure: TOTAL KNEE ARTHROPLASTY;  Surgeon: Marcene Corning, MD;  Location: [facility];  Service: Orthopedics;  Laterality: Left;   TOTAL KNEE ARTHROPLASTY Right 09/15/2019   Procedure: RIGHT TOTAL KNEE ARTHROPLASTY;  Surgeon: Marcene Corning, MD;  Location: [facility];  Service: Orthopedics;  Laterality: Right;   TUBAL LIGATION      Outpatient Medications Prior to Visit  Medication Sig Dispense Refill   acetaminophen (TYLENOL) 500 MG tablet Take 1,000 mg by mouth every 6 (six) hours as needed for moderate pain.     atorvastatin (LIPITOR) 20 MG tablet Take 1 tablet (20 mg total) by mouth daily. 90 tablet 3   azelastine (ASTELIN) 0.1 % nasal spray Place 1 spray into both nostrils 2 (two) times daily. Use in each nostril as directed 30 mL 12   Calcium Carbonate-Vitamin D (CALCIUM-D) 600-400 MG-UNIT TABS Take 2 tablets by mouth at bedtime.     Cholecalciferol 1.25 MG (50000 UT) capsule Take 1 capsule every week by oral route.     clobetasol cream (  TEMOVATE) 0.05 % Apply 1 application topically 2 (two) times daily as needed (skin irritation).     diclofenac (VOLTAREN) 75 MG EC tablet TAKE 1 TABLET BY MOUTH TWICE A DAY AS NEEDED FOR PAIN 60 tablet 2   diclofenac Sodium (VOLTAREN) 1 % GEL APPLY 4 GRAMS TOPICALLY TO THE AFFECTED AREA FOUR TIMES DAILY AS NEEDED FOR PAIN 300 g 0   fluticasone (FLONASE) 50 MCG/ACT nasal  spray SHAKE LIQUID AND USE 2 SPRAYS IN EACH NOSTRIL DAILY 48 mL 1   gabapentin (NEURONTIN) 100 MG capsule TAKE 1 CAPSULE BY MOUTH EVERYDAY AT BEDTIME     montelukast (SINGULAIR) 10 MG tablet Take 1 tablet (10 mg total) by mouth at bedtime. 90 tablet 1   tiZANidine (ZANAFLEX) 4 MG capsule tizanidine 4 mg capsule     triamcinolone (KENALOG) 0.1 % paste Use as directed 1 Application in the mouth or throat 2 (two) times daily. 5 g 12   zolpidem (AMBIEN) 10 MG tablet Take 1 tablet (10 mg total) by mouth at bedtime as needed for sleep. TAKE 1 TABLET(10 MG) BY MOUTH AT BEDTIME AS NEEDED FOR SLEEP Patient reports daily 12/24/2022 90 tablet 1   albuterol (PROAIR HFA) 108 (90 Base) MCG/ACT inhaler INHALE 2 PUFFS INTO THE LUNGS EVERY 6 HOURS AS NEEDED FOR WHEEZING OR SHORTNESS OF BREATH 8.5 g 0   levocetirizine (XYZAL) 5 MG tablet Take 5 mg by mouth daily.     phentermine (ADIPEX-P) 37.5 MG tablet Take 1 tablet (37.5 mg total) by mouth daily before breakfast. 90 tablet 0   Multiple Vitamins-Minerals (AIRBORNE PO) Take 2 tablets by mouth daily. (Patient not taking: Reported on 05/27/2023)     amoxicillin (AMOXIL) 400 MG/5ML suspension Take 5 mLs (400 mg total) by mouth 2 (two) times daily. (Patient not taking: Reported on 09/27/2023) 100 mL 0   No facility-administered medications prior to visit.    Family History  Problem Relation Age of Onset   Colon polyps Mother    Diabetes Mother    Hypertension Mother    Heart disease Mother    Hyperlipidemia Mother    Arthritis Mother    Kidney disease Mother    Stroke Mother    Lung cancer Father    Varicose Veins Sister    Alcohol abuse Brother    Hepatitis Brother        hep c   Breast cancer Maternal Grandmother        breast   Colon cancer Neg Hx    Esophageal cancer Neg Hx    Rectal cancer Neg Hx    Stomach cancer Neg Hx     Social History   Socioeconomic History   Marital status: Married    Spouse name: Not on file   Number of children: Not  on file   Years of education: Not on file   Highest education level: Not on file  Occupational History   Occupation: internet support  Tobacco Use   Smoking status: Some Days    Current packs/day: 0.00    Average packs/day: 0.5 packs/day for 30.0 years (15.0 ttl pk-yrs)    Types: Cigarettes    Start date: 07/30/1989    Last attempt to quit: 07/31/2019    Years since quitting: 4.1    Passive exposure: Never   Smokeless tobacco: Never   Tobacco comments:    1 pack per week on and off  Vaping Use   Vaping status: Never Used  Substance and Sexual Activity  Alcohol use: Not Currently    Comment: Maybe once a month   Drug use: No   Sexual activity: Yes    Partners: Male    Birth control/protection: Post-menopausal, Surgical  Other Topics Concern   Not on file  Social History Narrative   Exercise-- daily for 1 hour   Social Determinants of Health   Financial Resource Strain: Not on file  Food Insecurity: Not on file  Transportation Needs: Not on file  Physical Activity: Not on file  Stress: Not on file  Social Connections: Not on file  Intimate Partner Violence: Not on file                                                                                                  Objective:  Physical Exam: BP 130/84 (BP Location: Left Arm, Patient Position: Sitting, Cuff Size: Large)   Pulse (!) 50   Temp (!) 97.5 F (36.4 C) (Temporal)   Wt 178 lb 6.4 oz (80.9 kg)   SpO2 93%   BMI 33.71 kg/m   Wt Readings from Last 3 Encounters:  09/27/23 178 lb 6.4 oz (80.9 kg)  05/27/23 177 lb 12.8 oz (80.6 kg)  04/22/23 179 lb 12.8 oz (81.6 kg)     Physical Exam Constitutional:      General: She is not in acute distress.    Appearance: Normal appearance. She is not ill-appearing or toxic-appearing.  HENT:     Head: Normocephalic and atraumatic.     Nose: Nose normal. No congestion.  Eyes:     General: No scleral icterus.    Extraocular Movements: Extraocular movements intact.   Cardiovascular:     Rate and Rhythm: Normal rate and regular rhythm.     Pulses: Normal pulses.     Heart sounds: Normal heart sounds.  Pulmonary:     Effort: Pulmonary effort is normal. No respiratory distress.     Breath sounds: Normal breath sounds.  Abdominal:     General: Abdomen is flat. Bowel sounds are normal.     Palpations: Abdomen is soft.  Musculoskeletal:        General: Normal range of motion.     Left ankle: No swelling or deformity.  Lymphadenopathy:     Cervical: No cervical adenopathy.  Skin:    General: Skin is warm and dry.     Findings: No rash.  Neurological:     General: No focal deficit present.     Mental Status: She is alert and oriented to person, place, and time. Mental status is at baseline.  Psychiatric:        Mood and Affect: Mood normal.        Behavior: Behavior normal.        Thought Content: Thought content normal.        Judgment: Judgment normal.     Diabetic Foot Exam - Simple   Simple Foot Form Diabetic Foot exam was performed with the following findings: Yes 09/27/2023 10:51 AM  Visual Inspection Sensation Testing Pulse Check Posterior Tibialis and Dorsalis pulse intact bilaterally: Yes Comments Slight decrease  sensation left great toe.  Remainder of sensation test normal bilateral feet.  No deformities, ulcers, no skin breakdown.  Thickening of nails with yellowing bilaterally.     No results found.  Recent Results (from the past 2160 hour(s))  POCT glycosylated hemoglobin (Hb A1C)     Status: Abnormal   Collection Time: 09/27/23  8:32 AM  Result Value Ref Range   Hemoglobin A1C 6.4 (A) 4.0 - 5.6 %   HbA1c POC (<> result, manual entry)     HbA1c, POC (prediabetic range)     HbA1c, POC (controlled diabetic range)          Garner Nash, MD, MS

## 2023-09-27 NOTE — Assessment & Plan Note (Signed)
Follow up with orthopedics for potential further imaging or intervention. Encourage weight management for symptomatic relief. Consider non-pharmacological interventions for pain.

## 2023-09-28 ENCOUNTER — Other Ambulatory Visit: Payer: Self-pay | Admitting: Family Medicine

## 2023-09-30 ENCOUNTER — Other Ambulatory Visit (HOSPITAL_COMMUNITY): Payer: Self-pay

## 2023-09-30 ENCOUNTER — Telehealth: Payer: Self-pay

## 2023-09-30 DIAGNOSIS — E1149 Type 2 diabetes mellitus with other diabetic neurological complication: Secondary | ICD-10-CM

## 2023-09-30 NOTE — Telephone Encounter (Signed)
Pharmacy Patient Advocate Encounter   Received notification from RX Request Messages that prior authorization for Trulicity 0.75mg /0.40ml is required/requested.   Insurance verification completed.   The patient is insured through [facility] .   Per test claim: PA required; PA submitted to [facility] via CoverMyMeds Key/confirmation #/EOC [id number] Status is pending

## 2023-10-01 MED ORDER — METFORMIN HCL 500 MG PO TABS
1000.0000 mg | ORAL_TABLET | Freq: Two times a day (BID) | ORAL | 3 refills | Status: DC
Start: 2023-10-01 — End: 2024-01-31

## 2023-10-01 NOTE — Telephone Encounter (Signed)
Pharmacy Patient Advocate Encounter  Received notification from [facility] that Prior Authorization for Trulicity 0.75mg /0.64ml  has been DENIED.  Full denial letter will be uploaded to the media tab. See denial reason below.   PA #/Case ID/Reference #: [id number]   DENIAL REASON: Patient has not tried Metformin.

## 2023-10-01 NOTE — Addendum Note (Signed)
Addended by: Fanny Bien B on: 10/01/2023 03:07 PM   Modules accepted: Orders

## 2023-11-08 DIAGNOSIS — M25572 Pain in left ankle and joints of left foot: Secondary | ICD-10-CM | POA: Diagnosis not present

## 2023-11-20 ENCOUNTER — Other Ambulatory Visit: Payer: Self-pay | Admitting: Family Medicine

## 2023-11-20 ENCOUNTER — Other Ambulatory Visit: Payer: Self-pay | Admitting: Pulmonary Disease

## 2023-11-20 DIAGNOSIS — G479 Sleep disorder, unspecified: Secondary | ICD-10-CM

## 2023-11-20 DIAGNOSIS — G8929 Other chronic pain: Secondary | ICD-10-CM

## 2023-11-22 DIAGNOSIS — M25572 Pain in left ankle and joints of left foot: Secondary | ICD-10-CM | POA: Diagnosis not present

## 2023-11-25 ENCOUNTER — Other Ambulatory Visit: Payer: Self-pay | Admitting: Obstetrics and Gynecology

## 2023-11-25 DIAGNOSIS — Z1231 Encounter for screening mammogram for malignant neoplasm of breast: Secondary | ICD-10-CM | POA: Diagnosis not present

## 2023-11-25 DIAGNOSIS — Z130 Encounter for screening for diseases of the blood and blood-forming organs and certain disorders involving the immune mechanism: Secondary | ICD-10-CM | POA: Diagnosis not present

## 2023-11-25 DIAGNOSIS — Z6832 Body mass index (BMI) 32.0-32.9, adult: Secondary | ICD-10-CM | POA: Diagnosis not present

## 2023-11-25 DIAGNOSIS — E041 Nontoxic single thyroid nodule: Secondary | ICD-10-CM

## 2023-11-25 DIAGNOSIS — Z720 Tobacco use: Secondary | ICD-10-CM

## 2023-11-25 DIAGNOSIS — Z01419 Encounter for gynecological examination (general) (routine) without abnormal findings: Secondary | ICD-10-CM | POA: Diagnosis not present

## 2023-11-25 LAB — HM MAMMOGRAPHY

## 2023-11-25 LAB — HM PAP SMEAR: HM Pap smear: NEGATIVE

## 2023-11-29 DIAGNOSIS — M19072 Primary osteoarthritis, left ankle and foot: Secondary | ICD-10-CM | POA: Diagnosis not present

## 2023-12-02 ENCOUNTER — Ambulatory Visit: Payer: 59 | Admitting: Family Medicine
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
## 2023-12-13 ENCOUNTER — Other Ambulatory Visit: Payer: 59

## 2023-12-20 ENCOUNTER — Other Ambulatory Visit: Payer: 59

## 2023-12-23 ENCOUNTER — Telehealth: Payer: Medicaid Other | Admitting: Family Medicine

## 2023-12-23 DIAGNOSIS — M2669 Other specified disorders of temporomandibular joint: Secondary | ICD-10-CM | POA: Diagnosis not present

## 2023-12-23 DIAGNOSIS — K0889 Other specified disorders of teeth and supporting structures: Secondary | ICD-10-CM

## 2023-12-23 DIAGNOSIS — R110 Nausea: Secondary | ICD-10-CM | POA: Diagnosis not present

## 2023-12-23 MED ORDER — PENICILLIN V POTASSIUM 500 MG PO TABS
500.0000 mg | ORAL_TABLET | Freq: Three times a day (TID) | ORAL | 0 refills | Status: AC
Start: 2023-12-23 — End: 2023-12-30

## 2023-12-23 MED ORDER — ONDANSETRON 4 MG PO TBDP
4.0000 mg | ORAL_TABLET | Freq: Three times a day (TID) | ORAL | 0 refills | Status: DC | PRN
Start: 2023-12-23 — End: 2024-01-24

## 2023-12-23 NOTE — Progress Notes (Signed)
 Virtual Visit Consent   Robin Small, you are scheduled for a virtual visit with a [facility] provider today. Just as with appointments in the office, your consent must be obtained to participate. Your consent will be active for this visit and any virtual visit you may have with one of our providers in the next 365 days. If you have a MyChart account, a copy of this consent can be sent to you electronically.  As this is a virtual visit, video technology does not allow for your provider to perform a traditional examination. This may limit your provider's ability to fully assess your condition. If your provider identifies any concerns that need to be evaluated in person or the need to arrange testing (such as labs, EKG, etc.), we will make arrangements to do so. Although advances in technology are sophisticated, we cannot ensure that it will always work on either your end or our end. If the connection with a video visit is poor, the visit may have to be switched to a telephone visit. With either a video or telephone visit, we are not always able to ensure that we have a secure connection.  By engaging in this virtual visit, you consent to the provision of healthcare and authorize for your insurance to be billed (if applicable) for the services provided during this visit. Depending on your insurance coverage, you may receive a charge related to this service.  I need to obtain your verbal consent now. Are you willing to proceed with your visit today? Robin Small has provided verbal consent on 12/23/2023 for a virtual visit (video or telephone). [name], NP  Date: 12/23/2023 9:11 AM  Virtual Visit via Video Note   I, [name], connected with  Robin Small  ([id number], [birth year]) on 12/23/23 at  9:15 AM EST by a video-enabled telemedicine application and verified that I am speaking with the correct person using two identifiers.  Location: Patient: Virtual Visit Location Patient:  Home Provider: Virtual Visit Location Provider: Home Office   I discussed the limitations of evaluation and management by telemedicine and the availability of in person appointments. The patient expressed understanding and agreed to proceed.    History of Present Illness: Robin Small is a 63 y.o. who identifies as a female who was assigned female at birth, and is being seen today for tooth pain.  Reports having pain in the right TMJ area, but also upper molar tooth pain. Was treated fro gingivitis back in Sept, but has not had dental insurance until this year. She is calling around to get seen in case a tooth needs to be pulled.  She reports the pain is in front of the ear and goes to the behind the ear. Right upper molar tooth pain.   Ear pain is sharp pain. Pain 8/10 it has been making her cry. Tried heating pad, ibuprofen 600mg  taken last night at midnight and at 8 am this morning. Nothing seems to be helping.   Denies fevers, chills, no jaw swelling, no chewing trouble, pain with opening mouth, swallowing. Denies trauma, injury, wounds or broken tooth.    Problems:  Patient Active Problem List   Diagnosis Date Noted   Chronic midline low back pain with left-sided sciatica 09/27/2023   Extrinsic asthma 09/27/2023   Type 2 diabetes mellitus with neurological complications (HCC) 09/27/2023   Type 2 diabetes mellitus without complication, without long-term current use of insulin (HCC) 05/27/2023   Chronic pain of  left ankle 05/27/2023   Hyperlipidemia 03/01/2023   Chronic pain of both knees 12/29/2022   Tobacco use disorder 12/29/2022   Hemorrhoids, internal 08/31/2022   Lichen sclerosus 05/24/2022   TMJ (temporomandibular joint syndrome) 05/24/2022   Seasonal allergic rhinitis due to pollen 05/24/2022   Class 1 obesity with serious comorbidity and body mass index (BMI) of 33.0 to 33.9 in adult 05/24/2022   Sleep disturbance 05/24/2022   Primary osteoarthritis of right knee  09/15/2019   Primary osteoarthritis of left knee 03/03/2019   Seasonal and perennial allergic rhinitis 02/13/2019   S/P hemorrhoidectomy 02/01/2017   Post-operative nausea and vomiting 01/24/2017   Sinusitis, acute 08/24/2013   Dyspnea 01/08/2013   Sleep apnea 01/08/2013   Muscle ache 02/08/2012   History of colonic polyps 04/06/2011   External hemorrhoids 12/27/2010   Mild intermittent asthma 06/03/2010   LUMBAR SPRAIN AND STRAIN 07/20/2008   ANXIETY DEPRESSION 06/28/2008   Dyslipidemia 07/10/2007    Allergies:  Allergies  Allergen Reactions   Codeine Nausea Only   Medications:  Current Outpatient Medications:    acetaminophen  (TYLENOL ) 500 MG tablet, Take 1,000 mg by mouth every 6 (six) hours as needed for moderate pain., Disp: , Rfl:    albuterol  (PROAIR  HFA) 108 (90 Base) MCG/ACT inhaler, INHALE 2 PUFFS INTO THE LUNGS EVERY 6 HOURS AS NEEDED FOR WHEEZING OR SHORTNESS OF BREATH, Disp: 8.5 g, Rfl: 0   atorvastatin  (LIPITOR) 20 MG tablet, Take 1 tablet (20 mg total) by mouth daily., Disp: 90 tablet, Rfl: 3   azelastine  (ASTELIN ) 0.1 % nasal spray, Place 1 spray into both nostrils 2 (two) times daily. Use in each nostril as directed, Disp: 30 mL, Rfl: 12   Calcium  Carbonate-Vitamin D (CALCIUM -D) 600-400 MG-UNIT TABS, Take 2 tablets by mouth at bedtime., Disp: , Rfl:    Cholecalciferol 1.25 MG (50000 UT) capsule, Take 1 capsule every week by oral route., Disp: , Rfl:    clobetasol cream (TEMOVATE) 0.05 %, Apply 1 application topically 2 (two) times daily as needed (skin irritation)., Disp: , Rfl:    diclofenac  (VOLTAREN ) 75 MG EC tablet, TAKE 1 TABLET BY MOUTH TWICE A DAY AS NEEDED FOR PAIN, Disp: 60 tablet, Rfl: 2   diclofenac  Sodium (VOLTAREN ) 1 % GEL, APPLY 4 GRAMS TOPICALLY TO THE AFFECTED AREA FOUR TIMES DAILY AS NEEDED FOR PAIN, Disp: 300 g, Rfl: 0   fluticasone  (FLONASE ) 50 MCG/ACT nasal spray, SHAKE LIQUID AND USE 2 SPRAYS IN EACH NOSTRIL DAILY, Disp: 48 mL, Rfl: 1    gabapentin (NEURONTIN) 100 MG capsule, TAKE 1 CAPSULE BY MOUTH EVERYDAY AT BEDTIME, Disp: , Rfl:    levocetirizine (XYZAL ) 5 MG tablet, Take 1 tablet (5 mg total) by mouth every evening., Disp: 90 tablet, Rfl: 3   metFORMIN  (GLUCOPHAGE ) 500 MG tablet, Take 2 tablets (1,000 mg total) by mouth 2 (two) times daily with a meal., Disp: 180 tablet, Rfl: 3   metoCLOPramide  (REGLAN ) 5 MG tablet, Take 1 tablet (5 mg total) by mouth every 8 (eight) hours as needed for nausea., Disp: 30 tablet, Rfl: 0   montelukast  (SINGULAIR ) 10 MG tablet, Take 1 tablet (10 mg total) by mouth at bedtime., Disp: 90 tablet, Rfl: 1   Multiple Vitamins-Minerals (AIRBORNE PO), Take 2 tablets by mouth daily. (Patient not taking: Reported on 05/27/2023), Disp: , Rfl:    phentermine  (ADIPEX-P ) 37.5 MG tablet, Take 1 tablet (37.5 mg total) by mouth daily before breakfast., Disp: 90 tablet, Rfl: 1   tiZANidine  (ZANAFLEX ) 4 MG  capsule, tizanidine  4 mg capsule, Disp: , Rfl:    triamcinolone  (KENALOG ) 0.1 % paste, Use as directed 1 Application in the mouth or throat 2 (two) times daily., Disp: 5 g, Rfl: 12   zolpidem  (AMBIEN ) 10 MG tablet, TAKE 1 TABLET (10 MG TOTAL) BY MOUTH AT BEDTIME AS NEEDED FOR SLEEP, Disp: 90 tablet, Rfl: 0  Observations/Objective: Patient is well-developed, well-nourished in no acute distress.  Resting comfortably  at home.  Head is normocephalic, atraumatic.  No labored breathing.  Speech is clear and coherent with logical content.  Patient is alert and oriented at baseline.    Assessment and Plan:   1. Pain, dental (Primary)  - penicillin  v potassium (VEETID) 500 MG tablet; Take 1 tablet (500 mg total) by mouth 3 (three) times daily for 7 days.  Dispense: 21 tablet; Refill: 0  2. Nausea  - ondansetron  (ZOFRAN -ODT) 4 MG disintegrating tablet; Take 1 tablet (4 mg total) by mouth every 8 (eight) hours as needed for nausea or vomiting.  Dispense: 20 tablet; Refill: 0  3. TMJ  inflammation    -suspected TMJ inflammation vs dental infection vs perichondritis - no red flags or fever -will treat as dental infection, with management of inflammation -strict in person precautions reviewed  -follow up with dentist within 10 days. -can also use kenalog  paste for gum pain if motrin is not helping  -salt water  gargles  -complete the full course of ABX  Reviewed side effects, risks and benefits of medication.    Patient acknowledged agreement and understanding of the plan.   Past Medical, Surgical, Social History, Allergies, and Medications have been Reviewed.   Follow Up Instructions: I discussed the assessment and treatment plan with the patient. The patient was provided an opportunity to ask questions and all were answered. The patient agreed with the plan and demonstrated an understanding of the instructions.  A copy of instructions were sent to the patient via MyChart unless otherwise noted below.    The patient was advised to call back or seek an in-person evaluation if the symptoms worsen or if the condition fails to improve as anticipated.    [name], NP

## 2023-12-23 NOTE — Patient Instructions (Signed)
 Robin Small Minus, thank you for joining Robin Small Barefoot, NP for today's virtual visit.  While this provider is not your primary care provider (PCP), if your PCP is located in our provider database this encounter information will be shared with them immediately following your visit.   A [facility] MyChart account gives you access to today's visit and all your visits, tests, and labs performed at [facility]  click here if you don't have a [facility] MyChart account or go to [email or web address]  Consent: (Patient) Robin Small provided verbal consent for this virtual visit at the beginning of the encounter.  Current Medications:  Current Outpatient Medications:    ondansetron  (ZOFRAN -ODT) 4 MG disintegrating tablet, Take 1 tablet (4 mg total) by mouth every 8 (eight) hours as needed for nausea or vomiting., Disp: 20 tablet, Rfl: 0   penicillin  v potassium (VEETID) 500 MG tablet, Take 1 tablet (500 mg total) by mouth 3 (three) times daily for 7 days., Disp: 21 tablet, Rfl: 0   acetaminophen  (TYLENOL ) 500 MG tablet, Take 1,000 mg by mouth every 6 (six) hours as needed for moderate pain., Disp: , Rfl:    albuterol  (PROAIR  HFA) 108 (90 Base) MCG/ACT inhaler, INHALE 2 PUFFS INTO THE LUNGS EVERY 6 HOURS AS NEEDED FOR WHEEZING OR SHORTNESS OF BREATH, Disp: 8.5 g, Rfl: 0   atorvastatin  (LIPITOR) 20 MG tablet, Take 1 tablet (20 mg total) by mouth daily., Disp: 90 tablet, Rfl: 3   azelastine  (ASTELIN ) 0.1 % nasal spray, Place 1 spray into both nostrils 2 (two) times daily. Use in each nostril as directed, Disp: 30 mL, Rfl: 12   Calcium  Carbonate-Vitamin D (CALCIUM -D) 600-400 MG-UNIT TABS, Take 2 tablets by mouth at bedtime., Disp: , Rfl:    Cholecalciferol 1.25 MG (50000 UT) capsule, Take 1 capsule every week by oral route., Disp: , Rfl:    clobetasol cream (TEMOVATE) 0.05 %, Apply 1 application topically 2 (two) times daily as needed (skin irritation)., Disp: , Rfl:    diclofenac   (VOLTAREN ) 75 MG EC tablet, TAKE 1 TABLET BY MOUTH TWICE A DAY AS NEEDED FOR PAIN, Disp: 60 tablet, Rfl: 2   diclofenac  Sodium (VOLTAREN ) 1 % GEL, APPLY 4 GRAMS TOPICALLY TO THE AFFECTED AREA FOUR TIMES DAILY AS NEEDED FOR PAIN, Disp: 300 g, Rfl: 0   fluticasone  (FLONASE ) 50 MCG/ACT nasal spray, SHAKE LIQUID AND USE 2 SPRAYS IN EACH NOSTRIL DAILY, Disp: 48 mL, Rfl: 1   gabapentin (NEURONTIN) 100 MG capsule, TAKE 1 CAPSULE BY MOUTH EVERYDAY AT BEDTIME, Disp: , Rfl:    levocetirizine (XYZAL ) 5 MG tablet, Take 1 tablet (5 mg total) by mouth every evening., Disp: 90 tablet, Rfl: 3   metFORMIN  (GLUCOPHAGE ) 500 MG tablet, Take 2 tablets (1,000 mg total) by mouth 2 (two) times daily with a meal., Disp: 180 tablet, Rfl: 3   metoCLOPramide  (REGLAN ) 5 MG tablet, Take 1 tablet (5 mg total) by mouth every 8 (eight) hours as needed for nausea., Disp: 30 tablet, Rfl: 0   montelukast  (SINGULAIR ) 10 MG tablet, Take 1 tablet (10 mg total) by mouth at bedtime., Disp: 90 tablet, Rfl: 1   Multiple Vitamins-Minerals (AIRBORNE PO), Take 2 tablets by mouth daily. (Patient not taking: Reported on 05/27/2023), Disp: , Rfl:    phentermine  (ADIPEX-P ) 37.5 MG tablet, Take 1 tablet (37.5 mg total) by mouth daily before breakfast., Disp: 90 tablet, Rfl: 1   tiZANidine  (ZANAFLEX ) 4 MG capsule, tizanidine  4 mg capsule, Disp: , Rfl:  triamcinolone  (KENALOG ) 0.1 % paste, Use as directed 1 Application in the mouth or throat 2 (two) times daily., Disp: 5 g, Rfl: 12   zolpidem  (AMBIEN ) 10 MG tablet, TAKE 1 TABLET (10 MG TOTAL) BY MOUTH AT BEDTIME AS NEEDED FOR SLEEP, Disp: 90 tablet, Rfl: 0   Medications ordered in this encounter:  Meds ordered this encounter  Medications   penicillin  v potassium (VEETID) 500 MG tablet    Sig: Take 1 tablet (500 mg total) by mouth 3 (three) times daily for 7 days.    Dispense:  21 tablet    Refill:  0    Supervising Provider:   LAMPTEY, PHILIP O [[id number]]   ondansetron  (ZOFRAN -ODT) 4 MG  disintegrating tablet    Sig: Take 1 tablet (4 mg total) by mouth every 8 (eight) hours as needed for nausea or vomiting.    Dispense:  20 tablet    Refill:  0    Supervising Provider:   LAMPTEY, PHILIP O [[id number]]     *If you need refills on other medications prior to your next appointment, please contact your pharmacy*  Follow-Up: Call back or seek an in-person evaluation if the symptoms worsen or if the condition fails to improve as anticipated.  [facility] [phone]  Other Instructions  -will treat as dental infection, with management of inflammation -strict in person precautions reviewed  -follow up with dentist within 10 days. -can also use kenalog  paste for gum pain if motrin is not helping  -salt water  gargles  -complete the full course of medication   If you have been instructed to have an in-person evaluation today at a local Urgent Care facility, please use the link below. It will take you to a list of all of our available [facility], including address, phone number and hours of operation. Please do not delay care.  [facility]  If you or a family member do not have a primary care provider, use the link below to schedule a visit and establish care. When you choose a [facility] primary care physician or advanced practice provider, you gain a long-term partner in health. Find a Primary Care Provider  Learn more about [facility] in-office and virtual care options: [facility] - Get Care Now

## 2023-12-24 ENCOUNTER — Other Ambulatory Visit: Payer: Self-pay | Admitting: Family Medicine

## 2023-12-25 ENCOUNTER — Other Ambulatory Visit: Payer: Self-pay | Admitting: Family Medicine

## 2023-12-25 DIAGNOSIS — J301 Allergic rhinitis due to pollen: Secondary | ICD-10-CM

## 2024-01-03 ENCOUNTER — Ambulatory Visit: Payer: 59 | Admitting: Family Medicine

## 2024-01-03 ENCOUNTER — Other Ambulatory Visit: Payer: 59

## 2024-01-10 ENCOUNTER — Ambulatory Visit
Admission: RE | Admit: 2024-01-10 | Discharge: 2024-01-10 | Disposition: A | Discharge status: Home / Self Care | Payer: Medicaid Other | Source: Ambulatory Visit | Attending: Obstetrics and Gynecology | Admitting: Obstetrics and Gynecology

## 2024-01-10 DIAGNOSIS — E041 Nontoxic single thyroid nodule: Secondary | ICD-10-CM

## 2024-01-17 ENCOUNTER — Other Ambulatory Visit: Payer: Self-pay | Admitting: Family Medicine

## 2024-01-17 DIAGNOSIS — J301 Allergic rhinitis due to pollen: Secondary | ICD-10-CM

## 2024-01-20 ENCOUNTER — Other Ambulatory Visit (HOSPITAL_COMMUNITY): Payer: Self-pay

## 2024-01-24 ENCOUNTER — Telehealth: Payer: Medicaid Other | Admitting: Physician Assistant

## 2024-01-24 DIAGNOSIS — R6889 Other general symptoms and signs: Secondary | ICD-10-CM

## 2024-01-24 MED ORDER — FLUTICASONE PROPIONATE 50 MCG/ACT NA SUSP: 2.0000 | Freq: Every day | NASAL | 0 refills | Status: DC

## 2024-01-24 MED ORDER — ALBUTEROL SULFATE HFA 108 (90 BASE) MCG/ACT IN AERS: 1.0000 | INHALATION_SPRAY | Freq: Four times a day (QID) | RESPIRATORY_TRACT | 0 refills | Status: AC | PRN

## 2024-01-24 MED ORDER — PSEUDOEPH-BROMPHEN-DM 30-2-10 MG/5ML PO SYRP: 5.0000 mL | ORAL_SOLUTION | Freq: Four times a day (QID) | ORAL | 0 refills | Status: AC | PRN

## 2024-01-24 MED ORDER — ONDANSETRON 4 MG PO TBDP: 4.0000 mg | ORAL_TABLET | Freq: Three times a day (TID) | ORAL | 0 refills | Status: DC | PRN

## 2024-01-24 MED ORDER — BENZONATATE 100 MG PO CAPS: 100.0000 mg | ORAL_CAPSULE | Freq: Three times a day (TID) | ORAL | 0 refills | Status: DC | PRN

## 2024-01-24 NOTE — Progress Notes (Signed)
 Virtual Visit Consent   Robin Small, you are scheduled for a virtual visit with a [facility] provider today. Just as with appointments in the office, your consent must be obtained to participate. Your consent will be active for this visit and any virtual visit you may have with one of our providers in the next 365 days. If you have a MyChart account, a copy of this consent can be sent to you electronically.  As this is a virtual visit, video technology does not allow for your provider to perform a traditional examination. This may limit your provider's ability to fully assess your condition. If your provider identifies any concerns that need to be evaluated in person or the need to arrange testing (such as labs, EKG, etc.), we will make arrangements to do so. Although advances in technology are sophisticated, we cannot ensure that it will always work on either your end or our end. If the connection with a video visit is poor, the visit may have to be switched to a telephone visit. With either a video or telephone visit, we are not always able to ensure that we have a secure connection.  By engaging in this virtual visit, you consent to the provision of healthcare and authorize for your insurance to be billed (if applicable) for the services provided during this visit. Depending on your insurance coverage, you may receive a charge related to this service.  I need to obtain your verbal consent now. Are you willing to proceed with your visit today? Robin Small has provided verbal consent on 01/24/2024 for a virtual visit (video or telephone). [name], PA-C  Date: 01/24/2024 8:34 AM  Virtual Visit via Video Note   I, [name], connected with  FRANNIE Small  ([id number], 1961/01/01) on 01/24/24 at  8:15 AM EST by a video-enabled telemedicine application and verified that I am speaking with the correct person using two identifiers.  Location: Patient: Virtual Visit Location  Patient: Home Provider: Virtual Visit Location Provider: Home Office   I discussed the limitations of evaluation and management by telemedicine and the availability of in person appointments. The patient expressed understanding and agreed to proceed.    History of Present Illness: [name] is a 63 y.o. who identifies as a female who was assigned female at birth, and is being seen today for flu-like symptoms.  HPI: URI  This is a new problem. The current episode started in the past 7 days (Started Monday, 01/20/24). The problem has been gradually worsening. Maximum temperature: low grade fevers. Associated symptoms include congestion, coughing (dry), headaches, nausea, sinus pain and a sore throat (improving). Pertinent negatives include no chest pain, diarrhea, ear pain, plugged ear sensation, rhinorrhea, vomiting or wheezing. Associated symptoms comments: Chills yesterday. Treatments tried: mucienx, nyquil, sudafed. The treatment provided no relief.     Problems:  Patient Active Problem List   Diagnosis Date Noted   Chronic midline low back pain with left-sided sciatica 09/27/2023   Extrinsic asthma 09/27/2023   Type 2 diabetes mellitus with neurological complications (HCC) 09/27/2023   Type 2 diabetes mellitus without complication, without long-term current use of insulin (HCC) 05/27/2023   Chronic pain of left ankle 05/27/2023   Hyperlipidemia 03/01/2023   Chronic pain of both knees 12/29/2022   Tobacco use disorder 12/29/2022   Hemorrhoids, internal 08/31/2022   Lichen sclerosus 05/24/2022   TMJ (temporomandibular joint syndrome) 05/24/2022   Seasonal allergic rhinitis due to pollen 05/24/2022  Class 1 obesity with serious comorbidity and body mass index (BMI) of 33.0 to 33.9 in adult 05/24/2022   Sleep disturbance 05/24/2022   Primary osteoarthritis of right knee 09/15/2019   Primary osteoarthritis of left knee 03/03/2019   Seasonal and perennial allergic rhinitis  02/13/2019   S/P hemorrhoidectomy 02/01/2017   Post-operative nausea and vomiting 01/24/2017   Sinusitis, acute 08/24/2013   Dyspnea 01/08/2013   Sleep apnea 01/08/2013   Muscle ache 02/08/2012   History of colonic polyps 04/06/2011   External hemorrhoids 12/27/2010   Mild intermittent asthma 06/03/2010   LUMBAR SPRAIN AND STRAIN 07/20/2008   ANXIETY DEPRESSION 06/28/2008   Dyslipidemia 07/10/2007    Allergies:  Allergies  Allergen Reactions   Codeine Nausea Only   Medications:  Current Outpatient Medications:    albuterol  (VENTOLIN  HFA) 108 (90 Base) MCG/ACT inhaler, Inhale 1-2 puffs into the lungs every 6 (six) hours as needed., Disp: 8 g, Rfl: 0   benzonatate  (TESSALON ) 100 MG capsule, Take 1-2 capsules (100-200 mg total) by mouth 3 (three) times daily as needed., Disp: 30 capsule, Rfl: 0   brompheniramine-pseudoephedrine -DM 30-2-10 MG/5ML syrup, Take 5 mLs by mouth 4 (four) times daily as needed., Disp: 120 mL, Rfl: 0   fluticasone  (FLONASE ) 50 MCG/ACT nasal spray, Place 2 sprays into both nostrils daily., Disp: 16 g, Rfl: 0   ondansetron  (ZOFRAN -ODT) 4 MG disintegrating tablet, Take 1-2 tablets (4-8 mg total) by mouth every 8 (eight) hours as needed., Disp: 20 tablet, Rfl: 0   acetaminophen  (TYLENOL ) 500 MG tablet, Take 1,000 mg by mouth every 6 (six) hours as needed for moderate pain., Disp: , Rfl:    atorvastatin  (LIPITOR) 20 MG tablet, Take 1 tablet (20 mg total) by mouth daily., Disp: 90 tablet, Rfl: 3   Azelastine  HCl 137 MCG/SPRAY SOLN, SPRAY ONCE IN EACH NOSTRIL TWICE DAILY AS DIRECTED, Disp: 30 mL, Rfl: 11   Calcium  Carbonate-Vitamin D (CALCIUM -D) 600-400 MG-UNIT TABS, Take 2 tablets by mouth at bedtime., Disp: , Rfl:    Cholecalciferol 1.25 MG (50000 UT) capsule, Take 1 capsule every week by oral route., Disp: , Rfl:    clobetasol cream (TEMOVATE) 0.05 %, Apply 1 application topically 2 (two) times daily as needed (skin irritation)., Disp: , Rfl:    diclofenac  (VOLTAREN )  75 MG EC tablet, TAKE 1 TABLET BY MOUTH TWICE A DAY AS NEEDED FOR PAIN, Disp: 60 tablet, Rfl: 2   diclofenac  Sodium (VOLTAREN ) 1 % GEL, APPLY 4 GRAMS TOPICALLY TO THE AFFECTED AREA FOUR TIMES DAILY AS NEEDED FOR PAIN, Disp: 300 g, Rfl: 0   gabapentin (NEURONTIN) 100 MG capsule, TAKE 1 CAPSULE BY MOUTH EVERYDAY AT BEDTIME, Disp: , Rfl:    levocetirizine (XYZAL ) 5 MG tablet, Take 1 tablet (5 mg total) by mouth every evening., Disp: 90 tablet, Rfl: 3   metFORMIN  (GLUCOPHAGE ) 500 MG tablet, Take 2 tablets (1,000 mg total) by mouth 2 (two) times daily with a meal., Disp: 180 tablet, Rfl: 3   metoCLOPramide  (REGLAN ) 5 MG tablet, Take 1 tablet (5 mg total) by mouth every 8 (eight) hours as needed for nausea., Disp: 30 tablet, Rfl: 0   montelukast  (SINGULAIR ) 10 MG tablet, TAKE 1 TABLET BY MOUTH EVERYDAY AT BEDTIME, Disp: 90 tablet, Rfl: 2   Multiple Vitamins-Minerals (AIRBORNE PO), Take 2 tablets by mouth daily. (Patient not taking: Reported on 05/27/2023), Disp: , Rfl:    phentermine  (ADIPEX-P ) 37.5 MG tablet, Take 1 tablet (37.5 mg total) by mouth daily before breakfast., Disp: 90 tablet,  Rfl: 1   tiZANidine  (ZANAFLEX ) 4 MG capsule, tizanidine  4 mg capsule, Disp: , Rfl:    triamcinolone  (KENALOG ) 0.1 % paste, Use as directed 1 Application in the mouth or throat 2 (two) times daily., Disp: 5 g, Rfl: 12   zolpidem  (AMBIEN ) 10 MG tablet, TAKE 1 TABLET (10 MG TOTAL) BY MOUTH AT BEDTIME AS NEEDED FOR SLEEP, Disp: 90 tablet, Rfl: 0  Observations/Objective: Patient is well-developed, well-nourished in no acute distress.  Resting comfortably at home.  Head is normocephalic, atraumatic.  No labored breathing.  Speech is clear and coherent with logical content.  Patient is alert and oriented at baseline.    Assessment and Plan: 1. Flu-like symptoms (Primary) - ondansetron  (ZOFRAN -ODT) 4 MG disintegrating tablet; Take 1-2 tablets (4-8 mg total) by mouth every 8 (eight) hours as needed.  Dispense: 20 tablet;  Refill: 0 - brompheniramine-pseudoephedrine -DM 30-2-10 MG/5ML syrup; Take 5 mLs by mouth 4 (four) times daily as needed.  Dispense: 120 mL; Refill: 0 - benzonatate  (TESSALON ) 100 MG capsule; Take 1-2 capsules (100-200 mg total) by mouth 3 (three) times daily as needed.  Dispense: 30 capsule; Refill: 0 - albuterol  (VENTOLIN  HFA) 108 (90 Base) MCG/ACT inhaler; Inhale 1-2 puffs into the lungs every 6 (six) hours as needed.  Dispense: 8 g; Refill: 0 - fluticasone  (FLONASE ) 50 MCG/ACT nasal spray; Place 2 sprays into both nostrils daily.  Dispense: 16 g; Refill: 0  - Suspect viral URI - Symptomatic medications of choice over the counter as needed - Prescribed Zofran  for nausea, Bromfed DM and Tessalon  for cough, albuterol  for wheezing, and Flonase  for sinus congestion and drainage - Push fluids - Rest - Seek further evaluation if symptoms change or worsen   Follow Up Instructions: I discussed the assessment and treatment plan with the patient. The patient was provided an opportunity to ask questions and all were answered. The patient agreed with the plan and demonstrated an understanding of the instructions.  A copy of instructions were sent to the patient via MyChart unless otherwise noted below.    The patient was advised to call back or seek an in-person evaluation if the symptoms worsen or if the condition fails to improve as anticipated.    [name], PA-C

## 2024-01-24 NOTE — Patient Instructions (Signed)
 Robin Small Small, thank you for joining Robin Small CHRISTELLA Dickinson, PA-C for today's virtual visit.  While this provider is not your primary care provider (PCP), if your PCP is located in our provider database this encounter information will be shared with them immediately following your visit.   A [facility] MyChart account gives you access to today's visit and all your visits, tests, and labs performed at [facility]  click here if you don't have a [facility] MyChart account or go to [email or web address]  Consent: (Patient) Robin Small Small provided verbal consent for this virtual visit at the beginning of the encounter.  Current Medications:  Current Outpatient Medications:    albuterol  (VENTOLIN  HFA) 108 (90 Base) MCG/ACT inhaler, Inhale 1-2 puffs into the lungs every 6 (six) hours as needed., Disp: 8 g, Rfl: 0   benzonatate  (TESSALON ) 100 MG capsule, Take 1-2 capsules (100-200 mg total) by mouth 3 (three) times daily as needed., Disp: 30 capsule, Rfl: 0   brompheniramine-pseudoephedrine -DM 30-2-10 MG/5ML syrup, Take 5 mLs by mouth 4 (four) times daily as needed., Disp: 120 mL, Rfl: 0   fluticasone  (FLONASE ) 50 MCG/ACT nasal spray, Place 2 sprays into both nostrils daily., Disp: 16 g, Rfl: 0   ondansetron  (ZOFRAN -ODT) 4 MG disintegrating tablet, Take 1-2 tablets (4-8 mg total) by mouth every 8 (eight) hours as needed., Disp: 20 tablet, Rfl: 0   acetaminophen  (TYLENOL ) 500 MG tablet, Take 1,000 mg by mouth every 6 (six) hours as needed for moderate pain., Disp: , Rfl:    atorvastatin  (LIPITOR) 20 MG tablet, Take 1 tablet (20 mg total) by mouth daily., Disp: 90 tablet, Rfl: 3   Azelastine  HCl 137 MCG/SPRAY SOLN, SPRAY ONCE IN EACH NOSTRIL TWICE DAILY AS DIRECTED, Disp: 30 mL, Rfl: 11   Calcium  Carbonate-Vitamin D (CALCIUM -D) 600-400 MG-UNIT TABS, Take 2 tablets by mouth at bedtime., Disp: , Rfl:    Cholecalciferol 1.25 MG (50000 UT) capsule, Take 1 capsule every week by oral route.,  Disp: , Rfl:    clobetasol cream (TEMOVATE) 0.05 %, Apply 1 application topically 2 (two) times daily as needed (skin irritation)., Disp: , Rfl:    diclofenac  (VOLTAREN ) 75 MG EC tablet, TAKE 1 TABLET BY MOUTH TWICE A DAY AS NEEDED FOR PAIN, Disp: 60 tablet, Rfl: 2   diclofenac  Sodium (VOLTAREN ) 1 % GEL, APPLY 4 GRAMS TOPICALLY TO THE AFFECTED AREA FOUR TIMES DAILY AS NEEDED FOR PAIN, Disp: 300 g, Rfl: 0   gabapentin (NEURONTIN) 100 MG capsule, TAKE 1 CAPSULE BY MOUTH EVERYDAY AT BEDTIME, Disp: , Rfl:    levocetirizine (XYZAL ) 5 MG tablet, Take 1 tablet (5 mg total) by mouth every evening., Disp: 90 tablet, Rfl: 3   metFORMIN  (GLUCOPHAGE ) 500 MG tablet, Take 2 tablets (1,000 mg total) by mouth 2 (two) times daily with a meal., Disp: 180 tablet, Rfl: 3   metoCLOPramide  (REGLAN ) 5 MG tablet, Take 1 tablet (5 mg total) by mouth every 8 (eight) hours as needed for nausea., Disp: 30 tablet, Rfl: 0   montelukast  (SINGULAIR ) 10 MG tablet, TAKE 1 TABLET BY MOUTH EVERYDAY AT BEDTIME, Disp: 90 tablet, Rfl: 2   Multiple Vitamins-Minerals (AIRBORNE PO), Take 2 tablets by mouth daily. (Patient not taking: Reported on 05/27/2023), Disp: , Rfl:    phentermine  (ADIPEX-P ) 37.5 MG tablet, Take 1 tablet (37.5 mg total) by mouth daily before breakfast., Disp: 90 tablet, Rfl: 1   tiZANidine  (ZANAFLEX ) 4 MG capsule, tizanidine  4 mg capsule, Disp: , Rfl:    triamcinolone  (KENALOG )  0.1 % paste, Use as directed 1 Application in the mouth or throat 2 (two) times daily., Disp: 5 g, Rfl: 12   zolpidem  (AMBIEN ) 10 MG tablet, TAKE 1 TABLET (10 MG TOTAL) BY MOUTH AT BEDTIME AS NEEDED FOR SLEEP, Disp: 90 tablet, Rfl: 0   Medications ordered in this encounter:  Meds ordered this encounter  Medications   ondansetron  (ZOFRAN -ODT) 4 MG disintegrating tablet    Sig: Take 1-2 tablets (4-8 mg total) by mouth every 8 (eight) hours as needed.    Dispense:  20 tablet    Refill:  0    Supervising Provider:   LAMPTEY, PHILIP O [[id number]]    brompheniramine-pseudoephedrine -DM 30-2-10 MG/5ML syrup    Sig: Take 5 mLs by mouth 4 (four) times daily as needed.    Dispense:  120 mL    Refill:  0    Supervising Provider:   BLAISE ALEENE KIDD [id number]   benzonatate  (TESSALON ) 100 MG capsule    Sig: Take 1-2 capsules (100-200 mg total) by mouth 3 (three) times daily as needed.    Dispense:  30 capsule    Refill:  0    Supervising Provider:   LAMPTEY, PHILIP O [[id number]]   albuterol  (VENTOLIN  HFA) 108 (90 Base) MCG/ACT inhaler    Sig: Inhale 1-2 puffs into the lungs every 6 (six) hours as needed.    Dispense:  8 g    Refill:  0    Supervising Provider:   LAMPTEY, PHILIP O [id number]   fluticasone  (FLONASE ) 50 MCG/ACT nasal spray    Sig: Place 2 sprays into both nostrils daily.    Dispense:  16 g    Refill:  0    Supervising Provider:   BLAISE ALEENE KIDD [[id number]]     *If you need refills on other medications prior to your next appointment, please contact your pharmacy*  Follow-Up: Call back or seek an in-person evaluation if the symptoms worsen or if the condition fails to improve as anticipated.  [facility] [phone]  Other Instructions Viral Respiratory Infection A respiratory infection is an illness that affects part of the respiratory system, such as the lungs, nose, or throat. A respiratory infection that is caused by a virus is called a viral respiratory infection. Common types of viral respiratory infections include: A cold. The flu (influenza). A respiratory syncytial virus (RSV) infection. What are the causes? This condition is caused by a virus. The virus may spread through contact with droplets or direct contact with infected people or their mucus or secretions. The virus may spread from person to person (is contagious). What are the signs or symptoms? Symptoms of this condition include: A stuffy or runny nose. A sore throat or cough. Shortness of breath or difficulty breathing. Yellow or  green mucus (sputum). Other symptoms may include: A fever. Sweating or chills. Fatigue. Achy muscles. A headache. How is this diagnosed? This condition may be diagnosed based on: Your symptoms. A physical exam. Testing of secretions from the nose or throat. Chest X-ray. How is this treated? This condition may be treated with medicines, such as: Antiviral medicine. This may shorten the length of time a person has symptoms. Expectorants. These make it easier to cough up mucus. Decongestant nasal sprays. Acetaminophen  or NSAIDs, such as ibuprofen, to relieve fever and pain. Antibiotic medicines are not prescribed for viral infections.This is because antibiotics are designed to kill bacteria. They do not kill viruses. Follow these instructions at home: Managing pain and congestion  Take over-the-counter and prescription medicines only as told by your health care provider. If you have a sore throat, gargle with a mixture of salt and water  3-4 times a day or as needed. To make salt water , completely dissolve -1 tsp (3-6 g) of salt in 1 cup (237 mL) of warm water . Use nose drops made from salt water  to ease congestion and soften raw skin around your nose. Take 2 tsp (10 mL) of honey at bedtime to lessen coughing at night. Do not give honey to children who are younger than 1 year. Drink enough fluid to keep your urine pale yellow. This helps prevent dehydration and helps loosen up mucus. General instructions  Rest as much as possible. Do not drink alcohol. Do not use any products that contain nicotine  or tobacco. These products include cigarettes, chewing tobacco, and vaping devices, such as e-cigarettes. If you need help quitting, ask your health care provider. Keep all follow-up visits. This is important. How is this prevented?     Get an annual flu shot. You may get the flu shot in late summer, fall, or winter. Ask your health care provider when you should get your flu shot. Avoid  spreading your infection to other people. If you are sick: Wash your hands with soap and water  often, especially after you cough or sneeze. Wash for at least 20 seconds. If soap and water  are not available, use alcohol-based hand sanitizer. Cover your mouth when you cough. Cover your nose and mouth when you sneeze. Do not share cups or eating utensils. Clean commonly used objects often. Clean commonly touched surfaces. Stay home from work or school as told by your health care provider. Avoid contact with people who are sick during cold and flu season. This is generally fall and winter. Contact a health care provider if: Your symptoms last for 10 days or longer. Your symptoms get worse over time. You have severe sinus pain in your face or forehead. The glands in your jaw or neck become very swollen. You have shortness of breath. Get help right away if you: Feel pain or pressure in your chest. Have trouble breathing. Faint or feel like you will faint. Have severe and persistent vomiting. Feel confused or disoriented. These symptoms may represent a serious problem that is an emergency. Do not wait to see if the symptoms will go away. Get medical help right away. Call your local emergency services (911 in the [place] Do not drive yourself to the hospital. Summary A respiratory infection is an illness that affects part of the respiratory system, such as the lungs, nose, or throat. A respiratory infection that is caused by a virus is called a viral respiratory infection. Common types of viral respiratory infections include a cold, influenza, and respiratory syncytial virus (RSV) infection. Symptoms of this condition include a stuffy or runny nose, cough, fatigue, achy muscles, sore throat, and fevers or chills. Antibiotic medicines are not prescribed for viral infections. This is because antibiotics are designed to kill bacteria. They are not effective against viruses. This information is not  intended to replace advice given to you by your health care provider. Make sure you discuss any questions you have with your health care provider. Document Revised: 03/09/2021 Document Reviewed: 03/09/2021 Elsevier Patient Education  2024 [employer]   If you have been instructed to have an in-person evaluation today at a local Urgent Care facility, please use the link below. It will take you to a list of all of our  available [facility], including address, phone number and hours of operation. Please do not delay care.  [facility]  If you or a family member do not have a primary care provider, use the link below to schedule a visit and establish care. When you choose a [facility] primary care physician or advanced practice provider, you gain a long-term partner in health. Find a Primary Care Provider  Learn more about [facility] in-office and virtual care options: [facility] - Get Care Now

## 2024-01-31 ENCOUNTER — Encounter: Payer: Self-pay | Admitting: Family Medicine

## 2024-01-31 ENCOUNTER — Ambulatory Visit: Payer: Medicaid Other | Admitting: Family Medicine

## 2024-01-31 ENCOUNTER — Other Ambulatory Visit (HOSPITAL_COMMUNITY): Payer: Self-pay

## 2024-01-31 ENCOUNTER — Telehealth: Payer: Self-pay

## 2024-01-31 ENCOUNTER — Other Ambulatory Visit: Payer: Self-pay | Admitting: Family Medicine

## 2024-01-31 VITALS — BP 128/84 | HR 74 | Temp 97.5°F | Wt 173.2 lb

## 2024-01-31 DIAGNOSIS — M79672 Pain in left foot: Secondary | ICD-10-CM | POA: Diagnosis not present

## 2024-01-31 DIAGNOSIS — I700 Atherosclerosis of aorta: Secondary | ICD-10-CM

## 2024-01-31 DIAGNOSIS — E66811 Obesity, class 1: Secondary | ICD-10-CM | POA: Diagnosis not present

## 2024-01-31 DIAGNOSIS — Z6833 Body mass index (BMI) 33.0-33.9, adult: Secondary | ICD-10-CM | POA: Diagnosis not present

## 2024-01-31 DIAGNOSIS — Z720 Tobacco use: Secondary | ICD-10-CM | POA: Diagnosis not present

## 2024-01-31 DIAGNOSIS — E041 Nontoxic single thyroid nodule: Secondary | ICD-10-CM | POA: Diagnosis not present

## 2024-01-31 DIAGNOSIS — E1149 Type 2 diabetes mellitus with other diabetic neurological complication: Secondary | ICD-10-CM

## 2024-01-31 DIAGNOSIS — Z7985 Long-term (current) use of injectable non-insulin antidiabetic drugs: Secondary | ICD-10-CM | POA: Diagnosis not present

## 2024-01-31 DIAGNOSIS — E6609 Other obesity due to excess calories: Secondary | ICD-10-CM | POA: Diagnosis not present

## 2024-01-31 DIAGNOSIS — E782 Mixed hyperlipidemia: Secondary | ICD-10-CM | POA: Diagnosis not present

## 2024-01-31 LAB — POCT GLYCOSYLATED HEMOGLOBIN (HGB A1C): Hemoglobin A1C: 6.3 % — AB (ref 4.0–5.6)

## 2024-01-31 MED ORDER — VARENICLINE TARTRATE 1 MG PO TABS: ORAL_TABLET | ORAL | 0 refills | Status: DC

## 2024-01-31 MED ORDER — OZEMPIC (0.25 OR 0.5 MG/DOSE) 2 MG/3ML ~~LOC~~ SOPN: PEN_INJECTOR | SUBCUTANEOUS | 0 refills | Status: AC

## 2024-01-31 MED ORDER — NICOTINE 21 MG/24HR TD PT24: MEDICATED_PATCH | TRANSDERMAL | 0 refills | Status: DC

## 2024-01-31 NOTE — Telephone Encounter (Signed)
Pharmacy Patient Advocate Encounter   Received notification from CoverMyMeds that prior authorization for Ozempic (0.25 or 0.5 MG/DOSE) 2MG /3ML pen-injectorsis required/requested.   Insurance verification completed.   The patient is insured through [facility] MEDICAID.   Per test claim: PA required; PA submitted to above mentioned insurance via CoverMyMeds Key/confirmation #/EOC (Key: [id number])   Status is pending

## 2024-01-31 NOTE — Telephone Encounter (Signed)
Received fax request from pharmacy requesting PA for chantix. Please advise.

## 2024-01-31 NOTE — Progress Notes (Signed)
 Assessment/Plan:     Type 2 Diabetes Mellitus Type 2 Diabetes Mellitus with an A1c of 6.3%. Transitioning to Ozempic (semaglutide) due to severe diarrhea from metformin. Discussed Ozempic's benefits (weight loss, reduced risk of heart disease, kidney disease, and sleep apnea) and potential GI side effects. Patient prefers Ozempic despite husband's adverse experience. - Discontinue metformin - Start Ozempic 0.25 mg weekly for 4 weeks, then increase to 0.5 mg weekly for 8 weeks - Recheck A1c in 3 months  Thyroid Nodule Thyroid nodule (1.4 cm in the isthmus). Referred to endocrinology for further evaluation. Annual ultrasound recommended. - Follow up with endocrinology - Order thyroid function tests - Schedule annual thyroid ultrasound  Hyperlipidemia Hyperlipidemia with LDL 79 mg/dL, total cholesterol 161 mg/dL, and triglycerides 096 mg/dL. Currently on atorvastatin 20 mg. - Continue atorvastatin 20 mg daily - Order fasting lipid panel  Smoking Cessation Smoking cessation with previous success using Chantix. Patient interested in restarting Chantix. Discussed combining Chantix with nicotine replacement therapy (gum or patch). - Start Chantix 0.5 mg twice daily for 1 week, then increase to 1 mg twice daily for 1 week, and then to 2 mg twice daily if tolerated - Recommend nicotine replacement therapy (gum or patch)  Subtalar Joint Pain Chronic subtalar joint pain in the left foot with previous steroid injections providing some relief. Pain and neuropathy affecting activity levels. - Continue current management with orthopedics  General Health Maintenance Routine health maintenance including mammogram, DEXA scan, eye exam, and vaccinations. Discussed shingles vaccination; patient prefers to schedule later. - Obtain release of records for mammogram - Schedule DEXA scan - Schedule diabetic eye exam with ophthalmology - Discuss shingles vaccination and consider scheduling  later  Follow-up - Schedule fasting lab work including lipid panel, liver function tests, thyroid panel, CBC, and urinalysis - Follow up with endocrinology for thyroid nodule - Return in 3 months for follow-up on diabetes management and weight.        Medications Discontinued During This Encounter  Medication Reason   metFORMIN (GLUCOPHAGE) 500 MG tablet    Multiple Vitamins-Minerals (AIRBORNE PO)     Return in about 3 months (around 04/29/2024) for DM, fasting labs within 1 week of today's visit.    Subjective:   Encounter date: 01/31/2024  Robin Small is a 63 y.o. female who has Dyslipidemia; ANXIETY DEPRESSION; Mild intermittent asthma; LUMBAR SPRAIN AND STRAIN; External hemorrhoids; Muscle ache; Dyspnea; Sleep apnea; Sinusitis, acute; History of colonic polyps; Seasonal and perennial allergic rhinitis; Primary osteoarthritis of left knee; Primary osteoarthritis of right knee; Lichen sclerosus; S/P hemorrhoidectomy; TMJ (temporomandibular joint syndrome); Seasonal allergic rhinitis due to pollen; Class 1 obesity with serious comorbidity and body mass index (BMI) of 33.0 to 33.9 in adult; Sleep disturbance; Hemorrhoids, internal; Chronic pain of both knees; Tobacco use disorder; Hyperlipidemia; Post-operative nausea and vomiting; Type 2 diabetes mellitus without complication, without long-term current use of insulin (HCC); Chronic pain of left ankle; Chronic midline low back pain with left-sided sciatica; Extrinsic asthma; and Type 2 diabetes mellitus with neurological complications (HCC) on their problem list..   She  has a past medical history of Allergy, Anxiety, Asthma, Depression, Diverticulosis (03/2016), High cholesterol, History of kidney stones, Lichen sclerosus, Low blood pressure reading, OA (osteoarthritis), OSA on CPAP, Perianal cyst, Personal history of colonic polyps (04/06/2011), PONV (postoperative nausea and vomiting), PPD positive, Pre-diabetes, Seasonal allergies,  Sleep apnea, and Type 2 diabetes mellitus without complication, without long-term current use of insulin (HCC) (05/27/2023)..   She presents with  chief complaint of Medical Management of Chronic Issues (for DM, HLD, weight management. Non fasting. Had [place] of thyroid and was referred to endo. Chantix starter pack for smoking cessation) .   Discussed the use of AI scribe software for clinical note transcription with the patient, who gave verbal consent to proceed.  History of Present Illness   Robin Small is a 63 year old female with diabetes who presents for follow-up. She was referred by Dr. Arelia Sneddon for evaluation of a thyroid nodule.  She is experiencing daily diarrhea as a side effect of metformin, leading to discontinuation of the medication. She was previously prescribed Ozempic but faced coverage issues with Medicaid. She is currently taking metformin 1000 mg twice a day. Her A1c today is 6.3%.  She has a history of a thyroid nodule, discovered by her OBGYN, Dr. Arelia Sneddon. A recent thyroid ultrasound showed a 1.4 cm nodule located in the isthmus, which requires annual ultrasound monitoring. She has been referred to endocrinology for further evaluation but has not yet been contacted for an appointment.  She is concerned about weight management and cholesterol. She has previously used Chantix to quit smoking and is interested in restarting it. She has a history of high cholesterol and triglycerides, with atorvastatin being part of her treatment regimen. Her lipids six months ago showed an LDL of 79, total cholesterol of 156, and triglycerides of 160.  She also has a history of aortic atherosclerosis for which atorvastatin helps manage.  She reports a recent flu-like illness with symptoms including severe headaches behind the eyes, body aches, and a low-grade fever. She tested negative for COVID-19. She has experienced some shortness of breath and thick phlegm but denies wheezing. She occasionally  uses an albuterol inhaler, which sometimes makes her jittery.  She has a history of joint issues in her left foot, specifically subtalar joint pain, for which she has received steroid injections. She reports improvement in her ability to bend her foot but still experiences pain when bearing weight. She has been on a diet since having children and occasionally consumes sugary drinks.  She has experienced mood swings and night sweats, which she associates with hormonal changes. She has increased her intake of B12 and vitamin D to help manage these symptoms. She has a history of smoking and is considering using Chantix again to quit.         Past Surgical History:  Procedure Laterality Date   ARTHROSCOPIC KNEE Left    CESAREAN SECTION     COLONOSCOPY W/ POLYPECTOMY  04/11/2016   ENDOMETRIAL ABLATION     GANGLION CYST EXCISION Right    HEMORROIDECTOMY     JOINT REPLACEMENT  both knees 2020   lichen sclerosus lesion excision     Anus   TOTAL KNEE ARTHROPLASTY Left 03/03/2019   Procedure: TOTAL KNEE ARTHROPLASTY;  Surgeon: Marcene Corning, MD;  Location: [facility];  Service: Orthopedics;  Laterality: Left;   TOTAL KNEE ARTHROPLASTY Right 09/15/2019   Procedure: RIGHT TOTAL KNEE ARTHROPLASTY;  Surgeon: Marcene Corning, MD;  Location: [facility];  Service: Orthopedics;  Laterality: Right;   TUBAL LIGATION      Outpatient Medications Prior to Visit  Medication Sig Dispense Refill   acetaminophen (TYLENOL) 500 MG tablet Take 1,000 mg by mouth every 6 (six) hours as needed for moderate pain.     albuterol (VENTOLIN HFA) 108 (90 Base) MCG/ACT inhaler Inhale 1-2 puffs into the lungs every 6 (six) hours as needed. 8 g 0  atorvastatin (LIPITOR) 20 MG tablet Take 1 tablet (20 mg total) by mouth daily. 90 tablet 3   Azelastine HCl 137 MCG/SPRAY SOLN SPRAY ONCE IN EACH NOSTRIL TWICE DAILY AS DIRECTED 30 mL 11   benzonatate (TESSALON) 100 MG capsule Take 1-2 capsules (100-200 mg total) by mouth 3 (three)  times daily as needed. 30 capsule 0   brompheniramine-pseudoephedrine-DM 30-2-10 MG/5ML syrup Take 5 mLs by mouth 4 (four) times daily as needed. 120 mL 0   Calcium Carbonate-Vitamin D (CALCIUM-D) 600-400 MG-UNIT TABS Take 2 tablets by mouth at bedtime.     Cholecalciferol 1.25 MG (50000 UT) capsule Take 1 capsule every week by oral route.     clobetasol cream (TEMOVATE) 0.05 % Apply 1 application topically 2 (two) times daily as needed (skin irritation).     diclofenac (VOLTAREN) 75 MG EC tablet TAKE 1 TABLET BY MOUTH TWICE A DAY AS NEEDED FOR PAIN 60 tablet 2   diclofenac Sodium (VOLTAREN) 1 % GEL APPLY 4 GRAMS TOPICALLY TO THE AFFECTED AREA FOUR TIMES DAILY AS NEEDED FOR PAIN 300 g 0   fluticasone (FLONASE) 50 MCG/ACT nasal spray Place 2 sprays into both nostrils daily. 16 g 0   gabapentin (NEURONTIN) 100 MG capsule TAKE 1 CAPSULE BY MOUTH EVERYDAY AT BEDTIME     levocetirizine (XYZAL) 5 MG tablet Take 1 tablet (5 mg total) by mouth every evening. 90 tablet 3   montelukast (SINGULAIR) 10 MG tablet TAKE 1 TABLET BY MOUTH EVERYDAY AT BEDTIME 90 tablet 2   ondansetron (ZOFRAN-ODT) 4 MG disintegrating tablet Take 1-2 tablets (4-8 mg total) by mouth every 8 (eight) hours as needed. 20 tablet 0   phentermine (ADIPEX-P) 37.5 MG tablet Take 1 tablet (37.5 mg total) by mouth daily before breakfast. 90 tablet 1   tiZANidine (ZANAFLEX) 4 MG capsule tizanidine 4 mg capsule     triamcinolone (KENALOG) 0.1 % paste Use as directed 1 Application in the mouth or throat 2 (two) times daily. 5 g 12   zolpidem (AMBIEN) 10 MG tablet TAKE 1 TABLET (10 MG TOTAL) BY MOUTH AT BEDTIME AS NEEDED FOR SLEEP 90 tablet 0   metFORMIN (GLUCOPHAGE) 500 MG tablet Take 2 tablets (1,000 mg total) by mouth 2 (two) times daily with a meal. 180 tablet 3   metoCLOPramide (REGLAN) 5 MG tablet Take 1 tablet (5 mg total) by mouth every 8 (eight) hours as needed for nausea. 30 tablet 0   Multiple Vitamins-Minerals (AIRBORNE PO) Take 2  tablets by mouth daily. (Patient not taking: Reported on 05/27/2023)     No facility-administered medications prior to visit.    Family History  Problem Relation Age of Onset   Colon polyps Mother    Diabetes Mother    Hypertension Mother    Heart disease Mother    Hyperlipidemia Mother    Arthritis Mother    Kidney disease Mother    Stroke Mother    Lung cancer Father    Varicose Veins Sister    Alcohol abuse Brother    Hepatitis Brother        hep c   Breast cancer Maternal Grandmother        breast   Colon cancer Neg Hx    Esophageal cancer Neg Hx    Rectal cancer Neg Hx    Stomach cancer Neg Hx     Social History   Socioeconomic History   Marital status: Married    Spouse name: Not on file   Number of  children: Not on file   Years of education: Not on file   Highest education level: Associate degree: academic program  Occupational History   Occupation: internet support  Tobacco Use   Smoking status: Some Days    Current packs/day: 0.00    Average packs/day: 0.5 packs/day for 30.0 years (15.0 ttl pk-yrs)    Types: Cigarettes    Start date: 07/30/1989    Last attempt to quit: 07/31/2019    Years since quitting: 4.5    Passive exposure: Never   Smokeless tobacco: Never   Tobacco comments:    1 pack per week on and off  Vaping Use   Vaping status: Never Used  Substance and Sexual Activity   Alcohol use: Not Currently    Comment: Maybe once a month   Drug use: No   Sexual activity: Yes    Partners: Male    Birth control/protection: Post-menopausal, Surgical  Other Topics Concern   Not on file  Social History Narrative   Exercise-- daily for 1 hour   Social Drivers of Health   Financial Resource Strain: High Risk (01/30/2024)   Overall Financial Resource Strain (CARDIA)    Difficulty of Paying Living Expenses: Very hard  Food Insecurity: Food Insecurity Present (01/30/2024)   Hunger Vital Sign    Worried About Running Out of Food in the Last Year:  Sometimes true    Ran Out of Food in the Last Year: Sometimes true  Transportation Needs: No Transportation Needs (01/30/2024)   PRAPARE - Administrator, Civil Service (Medical): No    Lack of Transportation (Non-Medical): No  Physical Activity: Insufficiently Active (01/30/2024)   Exercise Vital Sign    Days of Exercise per Week: 2 days    Minutes of Exercise per Session: 30 min  Stress: Stress Concern Present (01/30/2024)   [employer] of Occupational Health - Occupational Stress Questionnaire    Feeling of Stress : To some extent  Social Connections: Moderately Integrated (01/30/2024)   Social Connection and Isolation Panel [NHANES]    Frequency of Communication with Friends and Family: More than three times a week    Frequency of Social Gatherings with Friends and Family: More than three times a week    Attends Religious Services: Never    Database administrator or Organizations: Yes    Attends Engineer, structural: More than 4 times per year    Marital Status: Married  Catering manager Violence: Not on file                                                                                                  Objective:  Physical Exam: BP 128/84   Pulse 74   Temp (!) 97.5 F (36.4 C) (Temporal)   Wt 173 lb 3.2 oz (78.6 kg)   SpO2 97%   BMI 32.73 kg/m   Wt Readings from Last 3 Encounters:  01/31/24 173 lb 3.2 oz (78.6 kg)  09/27/23 178 lb 6.4 oz (80.9 kg)  05/27/23 177 lb 12.8 oz (80.6 kg)    Physical Exam Constitutional:  General: She is not in acute distress.    Appearance: Normal appearance. She is not ill-appearing or toxic-appearing.  HENT:     Head: Normocephalic and atraumatic.     Nose: Nose normal. No congestion.  Eyes:     General: No scleral icterus.    Extraocular Movements: Extraocular movements intact.  Neck:     Thyroid: Thyroid mass present.  Cardiovascular:     Rate and Rhythm: Normal rate and regular rhythm.      Pulses: Normal pulses.     Heart sounds: Normal heart sounds.  Pulmonary:     Effort: Pulmonary effort is normal. No respiratory distress.     Breath sounds: Normal breath sounds.  Abdominal:     General: Abdomen is flat. Bowel sounds are normal.     Palpations: Abdomen is soft.  Musculoskeletal:        General: Normal range of motion.  Lymphadenopathy:     Cervical: No cervical adenopathy.  Skin:    General: Skin is warm and dry.     Findings: No rash.  Neurological:     General: No focal deficit present.     Mental Status: She is alert and oriented to person, place, and time. Mental status is at baseline.  Psychiatric:        Mood and Affect: Mood normal.        Behavior: Behavior normal.        Thought Content: Thought content normal.        Judgment: Judgment normal.     US THYROID Result Date: 01/12/2024 CLINICAL DATA:  Thyroid nodule EXAM: THYROID ULTRASOUND TECHNIQUE: Ultrasound examination of the thyroid gland and adjacent soft tissues was performed. COMPARISON:  None available. FINDINGS: Parenchymal Echotexture: Mildly heterogenous Isthmus: 0.4 cm Right lobe: 4.0 x 1.3 x 2.0 cm Left lobe: 4.5 x 1.8 x 1.7 cm _________________________________________________________ Estimated total number of nodules >/= 1 cm: 2 Number of spongiform nodules >/=  2 cm not described below (TR1): 0 Number of mixed cystic and solid nodules >/= 1.5 cm not described below (TR2): 0 _________________________________________________________ Nodule # 1: Location: Isthmus; mid Maximum size: 1.4 cm; Other 2 dimensions: 0.6 x 0.8 cm Composition: solid/almost completely solid (2) Echogenicity: hypoechoic (2) Shape: not taller-than-wide (0) Margins: smooth (0) Echogenic foci: none (0) ACR TI-RADS total points: 4. ACR TI-RADS risk category: TR4 (4-6 points). ACR TI-RADS recommendations: *Given size (>/= 1 - 1.4 cm) and appearance, a follow-up ultrasound in 1 year should be considered based on TI-RADS criteria.  _________________________________________________________ Nodule 2: 1.0 x 0.9 x 0.5 cm spongiform nodule in the inferior right thyroid lobe (TI-RADS 1) does not meet criteria for imaging surveillance or FNA. _________________________________________________________ Remaining subcentimeter bilateral thyroid nodules do not meet criteria for FNA or imaging follow-up. IMPRESSION: Nodule 1 (TI-RADS 4), measuring 1.4 cm, located in the isthmus meets criteria for imaging follow-up. Annual ultrasound surveillance is recommended until 5 years of stability is documented. The above is in keeping with the ACR TI-RADS recommendations - J Am Coll Radiol 2017;14:587-595. Electronically Signed   By: Acquanetta Belling M.D.   On: 01/12/2024 17:05    Recent Results (from the past 2160 hours)  HM MAMMOGRAPHY     Status: None   Collection Time: 11/25/23 12:00 AM  Result Value Ref Range   HM Mammogram 0-4 Bi-Rad 0-4 Bi-Rad, Self Reported Normal  HM PAP SMEAR     Status: None   Collection Time: 11/25/23 12:00 AM  Result Value Ref Range  HM Pap smear negative   POCT glycosylated hemoglobin (Hb A1C)     Status: Abnormal   Collection Time: 01/31/24  9:57 AM  Result Value Ref Range   Hemoglobin A1C 6.3 (A) 4.0 - 5.6 %   HbA1c POC (<> result, manual entry)     HbA1c, POC (prediabetic range)     HbA1c, POC (controlled diabetic range)          Garner Nash, MD, MS

## 2024-02-03 ENCOUNTER — Telehealth: Payer: Self-pay

## 2024-02-03 ENCOUNTER — Other Ambulatory Visit (HOSPITAL_COMMUNITY): Payer: Self-pay

## 2024-02-03 DIAGNOSIS — Z720 Tobacco use: Secondary | ICD-10-CM

## 2024-02-03 NOTE — Telephone Encounter (Signed)
 Pharmacy Patient Advocate Encounter   Received notification from RX Request Messages that prior authorization for Varenicline 1mg  tabs is required/requested.   Insurance verification completed.   The patient is insured through  [facility]  .   Per test claim: PA required; PA submitted to above mentioned insurance via CoverMyMeds Key/confirmation #/EOC [id number] Status is pending

## 2024-02-03 NOTE — Telephone Encounter (Signed)
 Pharmacy Patient Advocate Encounter  Received notification from [facility] that Prior Authorization for Ozempic has been APPROVED from 01/31/24 to 01/30/25   PA #/Case ID/Reference #: [id number]

## 2024-02-04 ENCOUNTER — Encounter: Payer: Self-pay | Admitting: Family Medicine

## 2024-02-04 DIAGNOSIS — M79672 Pain in left foot: Secondary | ICD-10-CM | POA: Insufficient documentation

## 2024-02-04 DIAGNOSIS — I700 Atherosclerosis of aorta: Secondary | ICD-10-CM | POA: Insufficient documentation

## 2024-02-04 DIAGNOSIS — E041 Nontoxic single thyroid nodule: Secondary | ICD-10-CM

## 2024-02-04 HISTORY — DX: Nontoxic single thyroid nodule: E04.1

## 2024-02-05 ENCOUNTER — Other Ambulatory Visit (HOSPITAL_COMMUNITY): Payer: Self-pay

## 2024-02-05 NOTE — Telephone Encounter (Signed)
 Pharmacy Patient Advocate Encounter  Received notification from  [facility]  that Prior Authorization for VARENICLINE TAB has been CANCELLED due to    PA #/Case ID/Reference #: [id number]

## 2024-02-06 MED ORDER — VARENICLINE TARTRATE 1 MG PO TABS: ORAL_TABLET | ORAL | 0 refills | Status: DC

## 2024-02-06 NOTE — Addendum Note (Signed)
 Addended by: Garnette Gunner on: 02/06/2024 11:36 AM   Modules accepted: Orders

## 2024-02-07 ENCOUNTER — Other Ambulatory Visit (INDEPENDENT_AMBULATORY_CARE_PROVIDER_SITE_OTHER): Payer: Medicaid Other

## 2024-02-07 DIAGNOSIS — I700 Atherosclerosis of aorta: Secondary | ICD-10-CM | POA: Diagnosis not present

## 2024-02-07 DIAGNOSIS — Z720 Tobacco use: Secondary | ICD-10-CM

## 2024-02-07 DIAGNOSIS — E041 Nontoxic single thyroid nodule: Secondary | ICD-10-CM

## 2024-02-07 DIAGNOSIS — E1149 Type 2 diabetes mellitus with other diabetic neurological complication: Secondary | ICD-10-CM | POA: Diagnosis not present

## 2024-02-07 DIAGNOSIS — E782 Mixed hyperlipidemia: Secondary | ICD-10-CM

## 2024-02-07 LAB — CBC WITH DIFFERENTIAL/PLATELET
Basophils Absolute: 0.1 10*3/uL (ref 0.0–0.1)
Basophils Relative: 0.9 % (ref 0.0–3.0)
Eosinophils Absolute: 0.2 10*3/uL (ref 0.0–0.7)
Eosinophils Relative: 1.9 % (ref 0.0–5.0)
HCT: 46 % (ref 36.0–46.0)
Hemoglobin: 15.2 g/dL — ABNORMAL HIGH (ref 12.0–15.0)
Lymphocytes Relative: 29.6 % (ref 12.0–46.0)
Lymphs Abs: 2.5 10*3/uL (ref 0.7–4.0)
MCHC: 33.1 g/dL (ref 30.0–36.0)
MCV: 92 fL (ref 78.0–100.0)
Monocytes Absolute: 0.7 10*3/uL (ref 0.1–1.0)
Monocytes Relative: 8.5 % (ref 3.0–12.0)
Neutro Abs: 5.1 10*3/uL (ref 1.4–7.7)
Neutrophils Relative %: 59.1 % (ref 43.0–77.0)
Platelets: 297 10*3/uL (ref 150.0–400.0)
RBC: 5 Mil/uL (ref 3.87–5.11)
RDW: 13.9 % (ref 11.5–15.5)
WBC: 8.6 10*3/uL (ref 4.0–10.5)

## 2024-02-07 LAB — LIPID PANEL
Cholesterol: 145 mg/dL (ref 0–200)
HDL: 45.8 mg/dL (ref >39.00)
LDL Cholesterol: 67 mg/dL (ref 0–99)
NonHDL: 99.04
Total CHOL/HDL Ratio: 3
Triglycerides: 158 mg/dL — ABNORMAL HIGH (ref 0.0–149.0)
VLDL: 31.6 mg/dL (ref 0.0–40.0)

## 2024-02-07 LAB — URINALYSIS, ROUTINE W REFLEX MICROSCOPIC
Bilirubin Urine: NEGATIVE
Hgb urine dipstick: NEGATIVE
Ketones, ur: NEGATIVE
Nitrite: NEGATIVE
RBC / HPF: NONE SEEN (ref 0–?)
Specific Gravity, Urine: 1.025 (ref 1.000–1.030)
Total Protein, Urine: NEGATIVE
Urine Glucose: NEGATIVE
Urobilinogen, UA: 0.2 (ref 0.0–1.0)
pH: 6 (ref 5.0–8.0)

## 2024-02-07 LAB — COMPREHENSIVE METABOLIC PANEL
ALT: 16 U/L (ref 0–35)
AST: 13 U/L (ref 0–37)
Albumin: 4.3 g/dL (ref 3.5–5.2)
Alkaline Phosphatase: 78 U/L (ref 39–117)
BUN: 10 mg/dL (ref 6–23)
CO2: 31 meq/L (ref 19–32)
Calcium: 9 mg/dL (ref 8.4–10.5)
Chloride: 104 meq/L (ref 96–112)
Creatinine, Ser: 0.58 mg/dL (ref 0.40–1.20)
GFR: 97.9 mL/min (ref >60.00)
Glucose, Bld: 98 mg/dL (ref 70–99)
Potassium: 4.4 meq/L (ref 3.5–5.1)
Sodium: 141 meq/L (ref 135–145)
Total Bilirubin: 0.7 mg/dL (ref 0.2–1.2)
Total Protein: 7.2 g/dL (ref 6.0–8.3)

## 2024-02-07 LAB — MICROALBUMIN / CREATININE URINE RATIO
Creatinine,U: 109.8 mg/dL
Microalb Creat Ratio: 7.6 mg/g (ref 0.0–30.0)
Microalb, Ur: 0.8 mg/dL (ref 0.0–1.9)

## 2024-02-08 LAB — THYROID PANEL WITH TSH
Free Thyroxine Index: 2.3 (ref 1.4–3.8)
T3 Uptake: 32 % (ref 22–35)
T4, Total: 7.3 ug/dL (ref 5.1–11.9)
TSH: 0.99 m[IU]/L (ref 0.40–4.50)

## 2024-02-10 ENCOUNTER — Encounter: Payer: Self-pay | Admitting: Family Medicine

## 2024-02-14 ENCOUNTER — Ambulatory Visit
Admission: RE | Admit: 2024-02-14 | Discharge: 2024-02-14 | Disposition: A | Discharge status: Home / Self Care | Payer: Medicaid Other | Source: Ambulatory Visit | Attending: Obstetrics and Gynecology | Admitting: Obstetrics and Gynecology

## 2024-02-14 DIAGNOSIS — Z720 Tobacco use: Secondary | ICD-10-CM

## 2024-02-14 DIAGNOSIS — Z87891 Personal history of nicotine dependence: Secondary | ICD-10-CM | POA: Diagnosis not present

## 2024-02-14 NOTE — Progress Notes (Signed)
 Current smoker  46 pack year  Asymptomatic

## 2024-03-02 ENCOUNTER — Other Ambulatory Visit: Payer: Self-pay | Admitting: Family Medicine

## 2024-03-02 DIAGNOSIS — Z720 Tobacco use: Secondary | ICD-10-CM

## 2024-03-02 NOTE — Telephone Encounter (Signed)
 Last Ov 01/31/24 Filled 02/06/24

## 2024-03-05 ENCOUNTER — Other Ambulatory Visit: Payer: Self-pay | Admitting: Family Medicine

## 2024-03-05 ENCOUNTER — Other Ambulatory Visit: Payer: Self-pay | Admitting: Pulmonary Disease

## 2024-03-05 DIAGNOSIS — G479 Sleep disorder, unspecified: Secondary | ICD-10-CM

## 2024-03-05 DIAGNOSIS — E785 Hyperlipidemia, unspecified: Secondary | ICD-10-CM

## 2024-03-06 ENCOUNTER — Other Ambulatory Visit: Payer: Self-pay | Admitting: Family Medicine

## 2024-03-08 ENCOUNTER — Other Ambulatory Visit: Payer: Self-pay | Admitting: Pulmonary Disease

## 2024-03-08 DIAGNOSIS — G479 Sleep disorder, unspecified: Secondary | ICD-10-CM

## 2024-03-09 ENCOUNTER — Other Ambulatory Visit: Payer: Self-pay | Admitting: Pulmonary Disease

## 2024-03-09 DIAGNOSIS — G479 Sleep disorder, unspecified: Secondary | ICD-10-CM

## 2024-03-09 NOTE — Telephone Encounter (Signed)
 Please advise controlled substance refill request. This was signed 3 days ago. Pt was last seen on 04-22-23 by Dr Wynona Neat.

## 2024-03-10 ENCOUNTER — Telehealth: Payer: Self-pay

## 2024-03-10 MED ORDER — ZOLPIDEM TARTRATE 10 MG PO TABS: 10.0000 mg | ORAL_TABLET | Freq: Every evening | ORAL | 0 refills | Status: DC | PRN

## 2024-03-10 NOTE — Telephone Encounter (Signed)
 Message was sent to wrong office. Patient's Robin Small is filled by patient's pulmonary doctor, Dr. Wynona Neat. Rx was sent in on 03/06/2024.

## 2024-03-10 NOTE — Telephone Encounter (Signed)
**Note De-identified  [place] Obfuscation** Please advise 

## 2024-03-10 NOTE — Telephone Encounter (Signed)
 Copied from CRM [id number]. Topic: General - Other >> Mar 09, 2024  8:06 AM Gurney Maxin H wrote: Reason for CRM: Patient called to check the status of refill request for zolpidem (AMBIEN) 10 MG tablet, advised patient request was received on 3/21 and it can take 24-48 hours to process refills, patient acknowledged. >> Mar 09, 2024  5:43 PM Eunice Blase wrote: Pt call again and wants to know why it is still pending. Please call pt at [phone]

## 2024-03-11 ENCOUNTER — Other Ambulatory Visit: Payer: Self-pay | Admitting: Pulmonary Disease

## 2024-03-11 ENCOUNTER — Telehealth: Payer: Self-pay

## 2024-03-11 DIAGNOSIS — G479 Sleep disorder, unspecified: Secondary | ICD-10-CM

## 2024-03-11 NOTE — Telephone Encounter (Signed)
 Copied from CRM [id number]. Topic: Clinical - Medication Refill >> Mar 11, 2024  3:06 PM Sundra Aland wrote: Most Recent Primary Care Visit:  Provider: [facility]  Department: [facility]  Visit Type: LAB VISIT  Date: 02/07/2024  Medication: zolpidem (AMBIEN) 10 MG tablet  Has the patient contacted their pharmacy? Yes (Agent: If no, request that the patient contact the pharmacy for the refill. If patient does not wish to contact the pharmacy document the reason why and proceed with request.) (Agent: If yes, when and what did the pharmacy advise?)  Is this the correct pharmacy for this prescription? Yes If no, delete pharmacy and type the correct one.  This is the patient's preferred pharmacy:  [id number] [address] [address], [place] - [address] [place] [address] Phone: [phone] Fax: [phone]   Has the prescription been filled recently? No  Is the patient out of the medication? Yes  Has the patient been seen for an appointment in the last year OR does the patient have an upcoming appointment? Yes  Can we respond through MyChart? Yes  Agent: Please be advised that Rx refills may take up to 3 business days. We ask that you follow-up with your pharmacy.

## 2024-03-11 NOTE — Telephone Encounter (Signed)
**Note De-identified  [place] Obfuscation** Please advise 

## 2024-03-11 NOTE — Telephone Encounter (Signed)
 Forwarding message below

## 2024-03-11 NOTE — Telephone Encounter (Signed)
 Copied from CRM [id number]. Topic: General - Other >> Mar 11, 2024  9:02 AM Gurney Maxin H wrote: Reason for CRM: Patient is calling in to have her zolpidem (AMBIEN) 10 MG tablet refilled, advised patient per notes from office she has to reach out to her Pulmonary doctor to have medication refilled. Patient states she did and doesn't have an appointment until June and that Dr. Janee Morn should be able to fill it since he's her primary. Please reach out to patient with some clarity, thanks.  Deandria [phone]

## 2024-03-12 ENCOUNTER — Other Ambulatory Visit: Payer: Self-pay | Admitting: Family Medicine

## 2024-03-13 ENCOUNTER — Other Ambulatory Visit: Payer: Self-pay

## 2024-03-13 DIAGNOSIS — G479 Sleep disorder, unspecified: Secondary | ICD-10-CM

## 2024-03-13 NOTE — Telephone Encounter (Signed)
 Copied from CRM [id number]. Topic: Clinical - Prescription Issue >> Mar 13, 2024  3:25 PM Theodis Sato wrote: Reason for CRM: Patient states she has been waiting for her  zolpidem (AMBIEN) 10 MG tablet to be refilled by Dr. Wynona Neat all week and it is now end of day Friday and the clinic is closed.

## 2024-03-16 ENCOUNTER — Other Ambulatory Visit: Payer: Self-pay | Admitting: Pulmonary Disease

## 2024-03-16 DIAGNOSIS — G479 Sleep disorder, unspecified: Secondary | ICD-10-CM

## 2024-03-16 MED ORDER — ZOLPIDEM TARTRATE 10 MG PO TABS
10.0000 mg | ORAL_TABLET | Freq: Every evening | ORAL | 0 refills | Status: DC | PRN
Start: 2024-03-16 — End: 2024-03-17

## 2024-03-16 NOTE — Telephone Encounter (Signed)
 Patient needs [facility]

## 2024-03-16 NOTE — Telephone Encounter (Signed)
**Note De-identified  [place] Obfuscation** Please advise 

## 2024-03-16 NOTE — Telephone Encounter (Unsigned)
 Copied from CRM [id number]. Topic: Clinical - Medication Refill >> Mar 16, 2024  1:23 PM Konrad Dolores wrote: Most Recent Primary Care Visit:  Provider: [facility]  Department: [facility]  Visit Type: LAB VISIT  Date: 02/07/2024  Medication: zolpidem (AMBIEN) 10 MG tablet  Has the patient contacted their pharmacy? Yes; advised patient that there were no refills left and she would need to contact her doctor's office for a new order of the medication. [facility] has already scheduled an appointment as well with soonest available of June 6th at 9am.  (Agent: If no, request that the patient contact the pharmacy for the refill. If patient does not wish to contact the pharmacy document the reason why and proceed with request.) (Agent: If yes, when and what did the pharmacy advise?)  Is this the correct pharmacy for this prescription? Yes If no, delete pharmacy and type the correct one.  This is the patient's preferred pharmacy:  [id number] [address], [place] - [address] [place] [address] Phone: [phone] Fax: [phone]   Has the prescription been filled recently? No  Is the patient out of the medication? Yes  Has the patient been seen for an appointment in the last year OR does the patient have an upcoming appointment? Yes  Can we respond through MyChart? No  Agent: Please be advised that Rx refills may take up to 3 business days. We ask that you follow-up with your pharmacy.

## 2024-03-17 MED ORDER — ZOLPIDEM TARTRATE 10 MG PO TABS
10.0000 mg | ORAL_TABLET | Freq: Every evening | ORAL | 0 refills | Status: DC | PRN
Start: 2024-03-17 — End: 2024-03-19

## 2024-03-17 NOTE — Telephone Encounter (Signed)
 Patient requesting Zolpidem 10mg    LOV 04/22/2023   Dr.Olalere can you please advise   Thank you

## 2024-03-17 NOTE — Telephone Encounter (Unsigned)
 Copied from CRM [id number]. Topic: Clinical - Prescription Issue >> Mar 16, 2024  5:03 PM Para March B wrote: Reason for CRM:  Patient call in for scrip Zolpidem (Ambien). Stated she's called 4 times and it's not filled. Talked to CAL. Waiting on doctor to sign prescription. Office will send information to doctor.Marland Kitchen

## 2024-03-19 ENCOUNTER — Telehealth: Payer: Self-pay | Admitting: Pulmonary Disease

## 2024-03-19 ENCOUNTER — Other Ambulatory Visit: Payer: Self-pay | Admitting: Pulmonary Disease

## 2024-03-19 DIAGNOSIS — G479 Sleep disorder, unspecified: Secondary | ICD-10-CM

## 2024-03-19 MED ORDER — ZOLPIDEM TARTRATE 10 MG PO TABS: 10.0000 mg | ORAL_TABLET | Freq: Every evening | ORAL | 1 refills | Status: DC | PRN

## 2024-03-19 NOTE — Telephone Encounter (Unsigned)
 Copied from CRM [id number]. Topic: Clinical - Prescription Issue >> Mar 18, 2024  8:32 AM Adele Barthel wrote: Reason for CRM:   Patient is calling in reference to her request for refill of zolpidem (AMBIEN) 10 MG tablet. She originally requested refill 1.5 weeks ago and is calling back in again. Her provider is not in clinic today and she is needing the prescription signed, could you see who the Doctor of the Day is and have them sign the prescription?   Spoke with CAL this morning and was directed to send you a message. Thank you!

## 2024-03-19 NOTE — Telephone Encounter (Signed)
 ATC x1 Left message to let patient know Dr.Olalere has resent zolpiderm sent into pharmacy of choice . Nothing else further needed.

## 2024-03-19 NOTE — Telephone Encounter (Signed)
 Last [facility] Message states:  Patient is calling in reference to her request for refill of zolpidem (AMBIEN) 10 MG tablet. She originally requested refill 1.5 weeks ago and is calling back in again. Her provider is not in clinic today and she is needing the prescription signed, could you see who the Doctor of the Day is and have them sign the prescription?    Spoke with CAL this morning and was directed to send you a message. Thank you!  PT is calling call center and upset no one has sent in there medication. DOD is Celine Mans. TY

## 2024-03-21 ENCOUNTER — Telehealth: Admitting: Family Medicine

## 2024-03-21 DIAGNOSIS — R220 Localized swelling, mass and lump, head: Secondary | ICD-10-CM | POA: Diagnosis not present

## 2024-03-21 MED ORDER — SULFAMETHOXAZOLE-TRIMETHOPRIM 800-160 MG PO TABS: 1.0000 | ORAL_TABLET | Freq: Two times a day (BID) | ORAL | 0 refills | Status: AC

## 2024-03-21 MED ORDER — PREDNISONE 10 MG (21) PO TBPK: ORAL_TABLET | ORAL | 0 refills | Status: DC

## 2024-03-21 NOTE — Progress Notes (Signed)
 Virtual Visit Consent   Robin Small, you are scheduled for a virtual visit with a [facility] provider today. Just as with appointments in the office, your consent must be obtained to participate. Your consent will be active for this visit and any virtual visit you may have with one of our providers in the next 365 days. If you have a MyChart account, a copy of this consent can be sent to you electronically.  As this is a virtual visit, video technology does not allow for your provider to perform a traditional examination. This may limit your provider's ability to fully assess your condition. If your provider identifies any concerns that need to be evaluated in person or the need to arrange testing (such as labs, EKG, etc.), we will make arrangements to do so. Although advances in technology are sophisticated, we cannot ensure that it will always work on either your end or our end. If the connection with a video visit is poor, the visit may have to be switched to a telephone visit. With either a video or telephone visit, we are not always able to ensure that we have a secure connection.  By engaging in this virtual visit, you consent to the provision of healthcare and authorize for your insurance to be billed (if applicable) for the services provided during this visit. Depending on your insurance coverage, you may receive a charge related to this service.  I need to obtain your verbal consent now. Are you willing to proceed with your visit today? [name] has provided verbal consent on 03/21/2024 for a virtual visit (video or telephone). Reed Pandy, [place]  Date: 03/21/2024 1:21 PM   Virtual Visit via Video Note   IReed Pandy, connected with  ERYN MARANDOLA  ([id number], Mar 14, 1961) on 03/21/24 at  1:15 PM EDT by a video-enabled telemedicine application and verified that I am speaking with the correct person using two identifiers.  Location: Patient: Virtual Visit Location Patient:  Home Provider: Virtual Visit Location Provider: Home Office   I discussed the limitations of evaluation and management by telemedicine and the availability of in person appointments. The patient expressed understanding and agreed to proceed.    History of Present Illness: Robin Small is a 63 y.o. who identifies as a female who was assigned female at birth, and is being seen today for c/o she has something going on and she is unsure of what it is. Pt states she has facial swelling that started yesterday on her left side.  Pt states she was outside working in the yard.  Pt states she cleaned her face very well.  Pt states it is not on the inside of her mouth.  Pt states she thinks she touched something then touched her face.  Pt states she thinks its related to something she was doing in the yard.  Pt denies respiratory issues, fever or chills.She or husband cannot see a break in the skin.  She states it feels like a goose egg on her cheek. Pt states she has pressure to her cheek, like there is fluid in her cheek. Pt states it itches a little.   HPI: HPI  Problems:  Patient Active Problem List   Diagnosis Date Noted   Left foot pain 02/04/2024   Thyroid nodule 02/04/2024   Aortic atherosclerosis (HCC) 02/04/2024   Chronic midline low back pain with left-sided sciatica 09/27/2023   Extrinsic asthma 09/27/2023   Type 2 diabetes mellitus with neurological complications (  HCC) 09/27/2023   Type 2 diabetes mellitus without complication, without long-term current use of insulin (HCC) 05/27/2023   Chronic pain of left ankle 05/27/2023   Hyperlipidemia 03/01/2023   Chronic pain of both knees 12/29/2022   Tobacco abuse 12/29/2022   Hemorrhoids, internal 08/31/2022   Lichen sclerosus 05/24/2022   TMJ (temporomandibular joint syndrome) 05/24/2022   Seasonal allergic rhinitis due to pollen 05/24/2022   Class 1 obesity with serious comorbidity and body mass index (BMI) of 33.0 to 33.9 in adult  05/24/2022   Sleep disturbance 05/24/2022   Primary osteoarthritis of right knee 09/15/2019   Primary osteoarthritis of left knee 03/03/2019   Seasonal and perennial allergic rhinitis 02/13/2019   S/P hemorrhoidectomy 02/01/2017   Post-operative nausea and vomiting 01/24/2017   Sinusitis, acute 08/24/2013   Dyspnea 01/08/2013   Sleep apnea 01/08/2013   Muscle ache 02/08/2012   History of colonic polyps 04/06/2011   External hemorrhoids 12/27/2010   Mild intermittent asthma 06/03/2010   LUMBAR SPRAIN AND STRAIN 07/20/2008   ANXIETY DEPRESSION 06/28/2008   Dyslipidemia 07/10/2007    Allergies:  Allergies  Allergen Reactions   Codeine Nausea Only   Medications:  Current Outpatient Medications:    predniSONE (STERAPRED UNI-PAK 21 TAB) 10 MG (21) TBPK tablet, Take following package directions., Disp: 21 tablet, Rfl: 0   sulfamethoxazole-trimethoprim (BACTRIM DS) 800-160 MG tablet, Take 1 tablet by mouth 2 (two) times daily for 5 days., Disp: 10 tablet, Rfl: 0   acetaminophen (TYLENOL) 500 MG tablet, Take 1,000 mg by mouth every 6 (six) hours as needed for moderate pain., Disp: , Rfl:    albuterol (VENTOLIN HFA) 108 (90 Base) MCG/ACT inhaler, Inhale 1-2 puffs into the lungs every 6 (six) hours as needed., Disp: 8 g, Rfl: 0   atorvastatin (LIPITOR) 20 MG tablet, TAKE 1 TABLET BY MOUTH EVERY DAY, Disp: 90 tablet, Rfl: 3   Azelastine HCl 137 MCG/SPRAY SOLN, SPRAY ONCE IN EACH NOSTRIL TWICE DAILY AS DIRECTED, Disp: 30 mL, Rfl: 11   benzonatate (TESSALON) 100 MG capsule, Take 1-2 capsules (100-200 mg total) by mouth 3 (three) times daily as needed., Disp: 30 capsule, Rfl: 0   brompheniramine-pseudoephedrine-DM 30-2-10 MG/5ML syrup, Take 5 mLs by mouth 4 (four) times daily as needed., Disp: 120 mL, Rfl: 0   Calcium Carbonate-Vitamin D (CALCIUM-D) 600-400 MG-UNIT TABS, Take 2 tablets by mouth at bedtime., Disp: , Rfl:    Cholecalciferol 1.25 MG (50000 UT) capsule, Take 1 capsule every week by  oral route., Disp: , Rfl:    clobetasol cream (TEMOVATE) 0.05 %, Apply 1 application topically 2 (two) times daily as needed (skin irritation)., Disp: , Rfl:    diclofenac Sodium (VOLTAREN) 1 % GEL, APPLY 4 GRAMS TOPICALLY TO THE AFFECTED AREA FOUR TIMES DAILY AS NEEDED FOR PAIN, Disp: 300 g, Rfl: 0   fluticasone (FLONASE) 50 MCG/ACT nasal spray, Place 2 sprays into both nostrils daily., Disp: 16 g, Rfl: 0   gabapentin (NEURONTIN) 100 MG capsule, TAKE 1 CAPSULE BY MOUTH EVERYDAY AT BEDTIME, Disp: , Rfl:    ibuprofen (ADVIL) 600 MG tablet, Take 1 tablet (600 mg total) by mouth every 8 (eight) hours as needed for moderate pain (pain score 4-6)., Disp: 60 tablet, Rfl: 0   levocetirizine (XYZAL) 5 MG tablet, Take 1 tablet (5 mg total) by mouth every evening., Disp: 90 tablet, Rfl: 3   metoCLOPramide (REGLAN) 5 MG tablet, Take 1 tablet (5 mg total) by mouth every 8 (eight) hours as needed for nausea.,  Disp: 30 tablet, Rfl: 0   montelukast (SINGULAIR) 10 MG tablet, TAKE 1 TABLET BY MOUTH EVERYDAY AT BEDTIME, Disp: 90 tablet, Rfl: 2   nicotine (NICODERM CQ - DOSED IN MG/24 HOURS) 21 mg/24hr patch, RX #1 Weeks 1-4: 21 mg x 1 patch daily. Wear for 24 hours. If you have sleep disturbances, remove at bedtime., Disp: 28 patch, Rfl: 0   ondansetron (ZOFRAN-ODT) 4 MG disintegrating tablet, Take 1-2 tablets (4-8 mg total) by mouth every 8 (eight) hours as needed., Disp: 20 tablet, Rfl: 0   phentermine (ADIPEX-P) 37.5 MG tablet, Take 1 tablet (37.5 mg total) by mouth daily before breakfast., Disp: 90 tablet, Rfl: 1   Semaglutide,0.25 or 0.5MG /DOS, (OZEMPIC, 0.25 OR 0.5 MG/DOSE,) 2 MG/3ML SOPN, Inject 0.25 mg into the skin once a week for 28 days, THEN 0.5 mg once a week., Disp: 3 mL, Rfl: 0   tiZANidine (ZANAFLEX) 4 MG capsule, tizanidine 4 mg capsule, Disp: , Rfl:    triamcinolone (KENALOG) 0.1 % paste, Use as directed 1 Application in the mouth or throat 2 (two) times daily., Disp: 5 g, Rfl: 12   varenicline  (CHANTIX) 1 MG tablet, TAKE 0.5 TABLETS (0.5 MG TOTAL) BY MOUTH DAILY FOR 7 DAYS, THEN 0.5 TABLETS (0.5 MG TOTAL) 2 (TWO) TIMES DAILY FOR 7 DAYS, THEN 1 TABLET (1 MG TOTAL) 2 (TWO) TIMES DAILY FOR 16 DAYS., Disp: 129 tablet, Rfl: 1   zolpidem (AMBIEN) 10 MG tablet, Take 1 tablet (10 mg total) by mouth at bedtime as needed. for sleep, Disp: 90 tablet, Rfl: 1  Observations/Objective: Patient is well-developed, well-nourished in no acute distress.  Resting comfortably at home.  Head is normocephalic, atraumatic.  No labored breathing.  Speech is clear and coherent with logical content.  Patient is alert and oriented at baseline.  Noticeable erythema and swelling noted to left cheek   Assessment and Plan: 1. Facial swelling (Primary) - predniSONE (STERAPRED UNI-PAK 21 TAB) 10 MG (21) TBPK tablet; Take following package directions.  Dispense: 21 tablet; Refill: 0 - sulfamethoxazole-trimethoprim (BACTRIM DS) 800-160 MG tablet; Take 1 tablet by mouth 2 (two) times daily for 5 days.  Dispense: 10 tablet; Refill: 0  -Will treat for possible cellulitis and allergic reaction component -Advised Pt to follow up in person with urgent care or PCP for worsening symptoms.   Follow Up Instructions: I discussed the assessment and treatment plan with the patient. The patient was provided an opportunity to ask questions and all were answered. The patient agreed with the plan and demonstrated an understanding of the instructions.  A copy of instructions were sent to the patient via MyChart unless otherwise noted below.     The patient was advised to call back or seek an in-person evaluation if the symptoms worsen or if the condition fails to improve as anticipated.    Reed Pandy, PA-C

## 2024-03-21 NOTE — Patient Instructions (Signed)
 Lucilla Lame, thank you for joining Reed Pandy, PA-C for today's virtual visit.  While this provider is not your primary care provider (PCP), if your PCP is located in our provider database this encounter information will be shared with them immediately following your visit.   A [place] [facility] MyChart account gives you access to today's visit and all your visits, tests, and labs performed at [facility] " click here if you don't have a [facility] MyChart account or go to [email or web address]  Consent: (Patient) EMMALEIGH LONGO provided verbal consent for this virtual visit at the beginning of the encounter.  Current Medications:  Current Outpatient Medications:    predniSONE (STERAPRED UNI-PAK 21 TAB) 10 MG (21) TBPK tablet, Take following package directions., Disp: 21 tablet, Rfl: 0   sulfamethoxazole-trimethoprim (BACTRIM DS) 800-160 MG tablet, Take 1 tablet by mouth 2 (two) times daily for 5 days., Disp: 10 tablet, Rfl: 0   acetaminophen (TYLENOL) 500 MG tablet, Take 1,000 mg by mouth every 6 (six) hours as needed for moderate pain., Disp: , Rfl:    albuterol (VENTOLIN HFA) 108 (90 Base) MCG/ACT inhaler, Inhale 1-2 puffs into the lungs every 6 (six) hours as needed., Disp: 8 g, Rfl: 0   atorvastatin (LIPITOR) 20 MG tablet, TAKE 1 TABLET BY MOUTH EVERY DAY, Disp: 90 tablet, Rfl: 3   Azelastine HCl 137 MCG/SPRAY SOLN, SPRAY ONCE IN EACH NOSTRIL TWICE DAILY AS DIRECTED, Disp: 30 mL, Rfl: 11   benzonatate (TESSALON) 100 MG capsule, Take 1-2 capsules (100-200 mg total) by mouth 3 (three) times daily as needed., Disp: 30 capsule, Rfl: 0   brompheniramine-pseudoephedrine-DM 30-2-10 MG/5ML syrup, Take 5 mLs by mouth 4 (four) times daily as needed., Disp: 120 mL, Rfl: 0   Calcium Carbonate-Vitamin D (CALCIUM-D) 600-400 MG-UNIT TABS, Take 2 tablets by mouth at bedtime., Disp: , Rfl:    Cholecalciferol 1.25 MG (50000 UT) capsule, Take 1 capsule every week by oral route., Disp: , Rfl:     clobetasol cream (TEMOVATE) 0.05 %, Apply 1 application topically 2 (two) times daily as needed (skin irritation)., Disp: , Rfl:    diclofenac Sodium (VOLTAREN) 1 % GEL, APPLY 4 GRAMS TOPICALLY TO THE AFFECTED AREA FOUR TIMES DAILY AS NEEDED FOR PAIN, Disp: 300 g, Rfl: 0   fluticasone (FLONASE) 50 MCG/ACT nasal spray, Place 2 sprays into both nostrils daily., Disp: 16 g, Rfl: 0   gabapentin (NEURONTIN) 100 MG capsule, TAKE 1 CAPSULE BY MOUTH EVERYDAY AT BEDTIME, Disp: , Rfl:    ibuprofen (ADVIL) 600 MG tablet, Take 1 tablet (600 mg total) by mouth every 8 (eight) hours as needed for moderate pain (pain score 4-6)., Disp: 60 tablet, Rfl: 0   levocetirizine (XYZAL) 5 MG tablet, Take 1 tablet (5 mg total) by mouth every evening., Disp: 90 tablet, Rfl: 3   metoCLOPramide (REGLAN) 5 MG tablet, Take 1 tablet (5 mg total) by mouth every 8 (eight) hours as needed for nausea., Disp: 30 tablet, Rfl: 0   montelukast (SINGULAIR) 10 MG tablet, TAKE 1 TABLET BY MOUTH EVERYDAY AT BEDTIME, Disp: 90 tablet, Rfl: 2   nicotine (NICODERM CQ - DOSED IN MG/24 HOURS) 21 mg/24hr patch, RX #1 Weeks 1-4: 21 mg x 1 patch daily. Wear for 24 hours. If you have sleep disturbances, remove at bedtime., Disp: 28 patch, Rfl: 0   ondansetron (ZOFRAN-ODT) 4 MG disintegrating tablet, Take 1-2 tablets (4-8 mg total) by mouth every 8 (eight) hours as needed., Disp: 20 tablet, Rfl: 0  phentermine (ADIPEX-P) 37.5 MG tablet, Take 1 tablet (37.5 mg total) by mouth daily before breakfast., Disp: 90 tablet, Rfl: 1   Semaglutide,0.25 or 0.5MG /DOS, (OZEMPIC, 0.25 OR 0.5 MG/DOSE,) 2 MG/3ML SOPN, Inject 0.25 mg into the skin once a week for 28 days, THEN 0.5 mg once a week., Disp: 3 mL, Rfl: 0   tiZANidine (ZANAFLEX) 4 MG capsule, tizanidine 4 mg capsule, Disp: , Rfl:    triamcinolone (KENALOG) 0.1 % paste, Use as directed 1 Application in the mouth or throat 2 (two) times daily., Disp: 5 g, Rfl: 12   varenicline (CHANTIX) 1 MG tablet, TAKE 0.5  TABLETS (0.5 MG TOTAL) BY MOUTH DAILY FOR 7 DAYS, THEN 0.5 TABLETS (0.5 MG TOTAL) 2 (TWO) TIMES DAILY FOR 7 DAYS, THEN 1 TABLET (1 MG TOTAL) 2 (TWO) TIMES DAILY FOR 16 DAYS., Disp: 129 tablet, Rfl: 1   zolpidem (AMBIEN) 10 MG tablet, Take 1 tablet (10 mg total) by mouth at bedtime as needed. for sleep, Disp: 90 tablet, Rfl: 1   Medications ordered in this encounter:  Meds ordered this encounter  Medications   predniSONE (STERAPRED UNI-PAK 21 TAB) 10 MG (21) TBPK tablet    Sig: Take following package directions.    Dispense:  21 tablet    Refill:  0    Please dispense one standard blister pack taper.   sulfamethoxazole-trimethoprim (BACTRIM DS) 800-160 MG tablet    Sig: Take 1 tablet by mouth 2 (two) times daily for 5 days.    Dispense:  10 tablet    Refill:  0     *If you need refills on other medications prior to your next appointment, please contact your pharmacy*  Follow-Up: Call back or seek an in-person evaluation if the symptoms worsen or if the condition fails to improve as anticipated.   Virtual Care [phone]  Other Instructions Cellulitis, Adult  Cellulitis is a skin infection. The infected area is usually warm, red, swollen, and tender. It most commonly occurs on the lower body, such as the legs, feet, and toes, but this condition can occur on any part of the body. The infection can travel to the muscles, blood, and underlying tissue and become life-threatening without treatment. It is important to get medical treatment right away for this condition. What are the causes? Cellulitis is caused by bacteria. The bacteria enter through a break in the skin, such as a cut, burn, insect or animal bite, open sore, or crack. What increases the risk? This condition is more likely to occur in people who: Have a weak body's defense system (immune system). Are older than 61 years old. Have diabetes. Have a type of long-term (chronic) liver disease (cirrhosis) or kidney  disease. Are obese. Have a skin condition such as: An itchy rash, such as eczema or psoriasis. A fungal rash on the feet or in skinfolds. Blistering rashes, such as shingles or chickenpox. Slow movement of blood in the veins (venous stasis). Fluid buildup below the skin (edema). Have open wounds on the skin, such as cuts, puncture wounds, burns, bites, scrapes, tattoos, piercings, or wounds from surgery. Have had radiation therapy. Use IV drugs. What are the signs or symptoms? Symptoms of this condition include: Skin that looks red, purple, or slightly darker than your usual skin color. Streaks or spots on the skin. Swollen area of the skin. Tenderness or pain when an area of the skin is touched. Warm skin. Fever or chills. Blisters. Tiredness (fatigue). How is this diagnosed? This  condition is diagnosed based on a medical history and physical exam. You may also have tests, including: Blood tests. Imaging tests. Tests on a sample of fluid taken from the wound (wound culture). How is this treated? Treatment for this condition may include: Medicines. These may include antibiotics or medicines to treat allergies (antihistamines). Rest. Applying cold or warm wet cloths (compresses) to the skin. If the condition is severe, you may need to stay in the hospital and get antibiotics through an IV. The infection usually starts to get better within 1-2 days of treatment. Follow these instructions at home: Medicines Take over-the-counter and prescription medicines only as told by your health care provider. If you were prescribed antibiotics, take them as told by your provider. Do not stop using the antibiotic even if you start to feel better. General instructions Drink enough fluid to keep your pee (urine) pale yellow. Do not touch or rub the infected area. Raise (elevate) the infected area above the level of your heart while you are sitting or lying down. Return to your normal  activities as told by your provider. Ask your provider what activities are safe for you. Apply warm or cold compresses to the affected area as told by your provider. Keep all follow-up visits. Your provider will need to make sure that a more serious infection is not developing. Contact a health care provider if: You have a fever. Your symptoms do not improve within 1-2 days of starting treatment or you develop new symptoms. Your bone or joint underneath the infected area becomes painful after the skin has healed. Your infection returns in the same area or another area. Signs of this may include: You notice a swollen bump in the infected area. Your red area gets larger, turns dark in color, or becomes more painful. Drainage increases. Pus or a bad smell develops in your infected area. You have more pain. You feel ill and have muscle aches and weakness. You develop vomiting or diarrhea that will not go away. Get help right away if: You notice red streaks coming from the infected area. You notice the skin turns purple or black and falls off. This symptom may be an emergency. Get help right away. Call 911. Do not wait to see if the symptom will go away. Do not drive yourself to the hospital. This information is not intended to replace advice given to you by your health care provider. Make sure you discuss any questions you have with your health care provider. Document Revised: 07/31/2022 Document Reviewed: 07/31/2022 Elsevier Patient Education  2024 Elsevier Inc.   If you have been instructed to have an in-person evaluation today at a local Urgent Care facility, please use the link below. It will take you to a list of all of our available [facility], including address, phone number and hours of operation. Please do not delay care.  [facility]  If you or a family member do not have a primary care provider, use the link below to schedule a visit and establish care.  When you choose a [place] [place] primary care physician or advanced practice provider, you gain a long-term partner in health. Find a Primary Care Provider  Learn more about [place]'s in-office and virtual care options: [place] - Get Care Now

## 2024-03-26 ENCOUNTER — Telehealth: Admitting: Physician Assistant

## 2024-03-26 DIAGNOSIS — B370 Candidal stomatitis: Secondary | ICD-10-CM

## 2024-03-26 MED ORDER — NYSTATIN 100000 UNIT/ML MT SUSP: 5.0000 mL | Freq: Four times a day (QID) | OROMUCOSAL | 0 refills | Status: AC

## 2024-03-26 NOTE — Patient Instructions (Signed)
 Robin Small, thank you for joining Gilberto Better, PA-C for today's virtual visit.  While this provider is not your primary care provider (PCP), if your PCP is located in our provider database this encounter information will be shared with them immediately following your visit.   A [facility] MyChart account gives you access to today's visit and all your visits, tests, and labs performed at [facility] " click here if you don't have a [facility] MyChart account or go to [email or web address]  Consent: (Patient) Robin Small provided verbal consent for this virtual visit at the beginning of the encounter.  Current Medications:  Current Outpatient Medications:    nystatin (MYCOSTATIN) 100000 UNIT/ML suspension, Take 5 mLs (500,000 Units total) by mouth 4 (four) times daily. Swish and swallow, Disp: 200 mL, Rfl: 0   acetaminophen (TYLENOL) 500 MG tablet, Take 1,000 mg by mouth every 6 (six) hours as needed for moderate pain., Disp: , Rfl:    albuterol (VENTOLIN HFA) 108 (90 Base) MCG/ACT inhaler, Inhale 1-2 puffs into the lungs every 6 (six) hours as needed., Disp: 8 g, Rfl: 0   atorvastatin (LIPITOR) 20 MG tablet, TAKE 1 TABLET BY MOUTH EVERY DAY, Disp: 90 tablet, Rfl: 3   Azelastine HCl 137 MCG/SPRAY SOLN, SPRAY ONCE IN EACH NOSTRIL TWICE DAILY AS DIRECTED, Disp: 30 mL, Rfl: 11   benzonatate (TESSALON) 100 MG capsule, Take 1-2 capsules (100-200 mg total) by mouth 3 (three) times daily as needed., Disp: 30 capsule, Rfl: 0   brompheniramine-pseudoephedrine-DM 30-2-10 MG/5ML syrup, Take 5 mLs by mouth 4 (four) times daily as needed., Disp: 120 mL, Rfl: 0   Calcium Carbonate-Vitamin D (CALCIUM-D) 600-400 MG-UNIT TABS, Take 2 tablets by mouth at bedtime., Disp: , Rfl:    Cholecalciferol 1.25 MG (50000 UT) capsule, Take 1 capsule every week by oral route., Disp: , Rfl:    clobetasol cream (TEMOVATE) 0.05 %, Apply 1 application topically 2 (two) times daily as needed (skin  irritation)., Disp: , Rfl:    diclofenac Sodium (VOLTAREN) 1 % GEL, APPLY 4 GRAMS TOPICALLY TO THE AFFECTED AREA FOUR TIMES DAILY AS NEEDED FOR PAIN, Disp: 300 g, Rfl: 0   fluticasone (FLONASE) 50 MCG/ACT nasal spray, Place 2 sprays into both nostrils daily., Disp: 16 g, Rfl: 0   gabapentin (NEURONTIN) 100 MG capsule, TAKE 1 CAPSULE BY MOUTH EVERYDAY AT BEDTIME, Disp: , Rfl:    ibuprofen (ADVIL) 600 MG tablet, Take 1 tablet (600 mg total) by mouth every 8 (eight) hours as needed for moderate pain (pain score 4-6)., Disp: 60 tablet, Rfl: 0   levocetirizine (XYZAL) 5 MG tablet, Take 1 tablet (5 mg total) by mouth every evening., Disp: 90 tablet, Rfl: 3   metoCLOPramide (REGLAN) 5 MG tablet, Take 1 tablet (5 mg total) by mouth every 8 (eight) hours as needed for nausea., Disp: 30 tablet, Rfl: 0   montelukast (SINGULAIR) 10 MG tablet, TAKE 1 TABLET BY MOUTH EVERYDAY AT BEDTIME, Disp: 90 tablet, Rfl: 2   nicotine (NICODERM CQ - DOSED IN MG/24 HOURS) 21 mg/24hr patch, RX #1 Weeks 1-4: 21 mg x 1 patch daily. Wear for 24 hours. If you have sleep disturbances, remove at bedtime., Disp: 28 patch, Rfl: 0   ondansetron (ZOFRAN-ODT) 4 MG disintegrating tablet, Take 1-2 tablets (4-8 mg total) by mouth every 8 (eight) hours as needed., Disp: 20 tablet, Rfl: 0   phentermine (ADIPEX-P) 37.5 MG tablet, Take 1 tablet (37.5 mg total) by mouth daily before breakfast., Disp: 90  tablet, Rfl: 1   predniSONE (STERAPRED UNI-PAK 21 TAB) 10 MG (21) TBPK tablet, Take following package directions., Disp: 21 tablet, Rfl: 0   Semaglutide,0.25 or 0.5MG /DOS, (OZEMPIC, 0.25 OR 0.5 MG/DOSE,) 2 MG/3ML SOPN, Inject 0.25 mg into the skin once a week for 28 days, THEN 0.5 mg once a week., Disp: 3 mL, Rfl: 0   sulfamethoxazole-trimethoprim (BACTRIM DS) 800-160 MG tablet, Take 1 tablet by mouth 2 (two) times daily for 5 days., Disp: 10 tablet, Rfl: 0   tiZANidine (ZANAFLEX) 4 MG capsule, tizanidine 4 mg capsule, Disp: , Rfl:    triamcinolone  (KENALOG) 0.1 % paste, Use as directed 1 Application in the mouth or throat 2 (two) times daily., Disp: 5 g, Rfl: 12   varenicline (CHANTIX) 1 MG tablet, TAKE 0.5 TABLETS (0.5 MG TOTAL) BY MOUTH DAILY FOR 7 DAYS, THEN 0.5 TABLETS (0.5 MG TOTAL) 2 (TWO) TIMES DAILY FOR 7 DAYS, THEN 1 TABLET (1 MG TOTAL) 2 (TWO) TIMES DAILY FOR 16 DAYS., Disp: 129 tablet, Rfl: 1   zolpidem (AMBIEN) 10 MG tablet, Take 1 tablet (10 mg total) by mouth at bedtime as needed. for sleep, Disp: 90 tablet, Rfl: 1   Medications ordered in this encounter:  Meds ordered this encounter  Medications   nystatin (MYCOSTATIN) 100000 UNIT/ML suspension    Sig: Take 5 mLs (500,000 Units total) by mouth 4 (four) times daily. Swish and swallow    Dispense:  200 mL    Refill:  0    Supervising Provider:   Merrilee Jansky [[id number]]     *If you need refills on other medications prior to your next appointment, please contact your pharmacy*  Follow-Up: Call back or seek an in-person evaluation if the symptoms worsen or if the condition fails to improve as anticipated.  [facility] [phone]  Other Instructions Start medicine as prescribed. Continue to watch for worsening symptoms. Schedule a virtual appointment or visit an urgent care location if symptoms don't improve.    If you have been instructed to have an in-person evaluation today at a local Urgent Care facility, please use the link below. It will take you to a list of all of our available [facility], including address, phone number and hours of operation. Please do not delay care.  [facility]  If you or a family member do not have a primary care provider, use the link below to schedule a visit and establish care. When you choose a [facility] primary care physician or advanced practice provider, you gain a long-term partner in health. Find a Primary Care Provider  Learn more about [facility]'s in-office and virtual care  options: [facility] - Get Care Now

## 2024-03-26 NOTE — Progress Notes (Signed)
 Virtual Visit Consent   Robin Small, you are scheduled for a virtual visit with a [facility] provider today. Just as with appointments in the office, your consent must be obtained to participate. Your consent will be active for this visit and any virtual visit you may have with one of our providers in the next 365 days. If you have a MyChart account, a copy of this consent can be sent to you electronically.  As this is a virtual visit, video technology does not allow for your provider to perform a traditional examination. This may limit your provider's ability to fully assess your condition. If your provider identifies any concerns that need to be evaluated in person or the need to arrange testing (such as labs, EKG, etc.), we will make arrangements to do so. Although advances in technology are sophisticated, we cannot ensure that it will always work on either your end or our end. If the connection with a video visit is poor, the visit may have to be switched to a telephone visit. With either a video or telephone visit, we are not always able to ensure that we have a secure connection.  By engaging in this virtual visit, you consent to the provision of healthcare and authorize for your insurance to be billed (if applicable) for the services provided during this visit. Depending on your insurance coverage, you may receive a charge related to this service.  I need to obtain your verbal consent now. Are you willing to proceed with your visit today? Robin Small has provided verbal consent on 03/26/2024 for a virtual visit (video or telephone). Robin Small, [place]  Date: 03/26/2024 6:12 PM   Virtual Visit via Video Note   I, Robin Small, connected with  Robin Small  ([id number], 1961-03-23) on 03/26/24 at  6:00 PM EDT by a video-enabled telemedicine application and verified that I am speaking with the correct person using two identifiers.  Location: Patient: Virtual Visit Location Patient:  Home Provider: Virtual Visit Location Provider: Home Office   I discussed the limitations of evaluation and management by telemedicine and the availability of in person appointments. The patient expressed understanding and agreed to proceed.    History of Present Illness: Robin Small is a 63 y.o. who identifies as a female who was assigned female at birth, and is being seen today for mouth sorness.  HPI: 63 y/o F presents to the video telehealth visit for c/o feeling of raw, irritated and redness with swelling of gums over the past few days. Noticed white plaques on tongue. Was started on oral prednisone about a week ago after face swelling as well as Bactrim. Today was her last day of prednisone. Denies difficulty swallowing, but does feel "irritated and raw". Denies fever.   Rash    Problems:  Patient Active Problem List   Diagnosis Date Noted   Left foot pain 02/04/2024   Thyroid nodule 02/04/2024   Aortic atherosclerosis (HCC) 02/04/2024   Chronic midline low back pain with left-sided sciatica 09/27/2023   Extrinsic asthma 09/27/2023   Type 2 diabetes mellitus with neurological complications (HCC) 09/27/2023   Type 2 diabetes mellitus without complication, without long-term current use of insulin (HCC) 05/27/2023   Chronic pain of left ankle 05/27/2023   Hyperlipidemia 03/01/2023   Chronic pain of both knees 12/29/2022   Tobacco abuse 12/29/2022   Hemorrhoids, internal 08/31/2022   Lichen sclerosus 05/24/2022   TMJ (temporomandibular joint syndrome) 05/24/2022   Seasonal allergic rhinitis  due to pollen 05/24/2022   Class 1 obesity with serious comorbidity and body mass index (BMI) of 33.0 to 33.9 in adult 05/24/2022   Sleep disturbance 05/24/2022   Primary osteoarthritis of right knee 09/15/2019   Primary osteoarthritis of left knee 03/03/2019   Seasonal and perennial allergic rhinitis 02/13/2019   S/P hemorrhoidectomy 02/01/2017   Post-operative nausea and vomiting  01/24/2017   Sinusitis, acute 08/24/2013   Dyspnea 01/08/2013   Sleep apnea 01/08/2013   Muscle ache 02/08/2012   History of colonic polyps 04/06/2011   External hemorrhoids 12/27/2010   Mild intermittent asthma 06/03/2010   LUMBAR SPRAIN AND STRAIN 07/20/2008   ANXIETY DEPRESSION 06/28/2008   Dyslipidemia 07/10/2007    Allergies:  Allergies  Allergen Reactions   Codeine Nausea Only   Medications:  Current Outpatient Medications:    nystatin (MYCOSTATIN) 100000 UNIT/ML suspension, Take 5 mLs (500,000 Units total) by mouth 4 (four) times daily. Swish and swallow, Disp: 200 mL, Rfl: 0   acetaminophen (TYLENOL) 500 MG tablet, Take 1,000 mg by mouth every 6 (six) hours as needed for moderate pain., Disp: , Rfl:    albuterol (VENTOLIN HFA) 108 (90 Base) MCG/ACT inhaler, Inhale 1-2 puffs into the lungs every 6 (six) hours as needed., Disp: 8 g, Rfl: 0   atorvastatin (LIPITOR) 20 MG tablet, TAKE 1 TABLET BY MOUTH EVERY DAY, Disp: 90 tablet, Rfl: 3   Azelastine HCl 137 MCG/SPRAY SOLN, SPRAY ONCE IN EACH NOSTRIL TWICE DAILY AS DIRECTED, Disp: 30 mL, Rfl: 11   benzonatate (TESSALON) 100 MG capsule, Take 1-2 capsules (100-200 mg total) by mouth 3 (three) times daily as needed., Disp: 30 capsule, Rfl: 0   brompheniramine-pseudoephedrine-DM 30-2-10 MG/5ML syrup, Take 5 mLs by mouth 4 (four) times daily as needed., Disp: 120 mL, Rfl: 0   Calcium Carbonate-Vitamin D (CALCIUM-D) 600-400 MG-UNIT TABS, Take 2 tablets by mouth at bedtime., Disp: , Rfl:    Cholecalciferol 1.25 MG (50000 UT) capsule, Take 1 capsule every week by oral route., Disp: , Rfl:    clobetasol cream (TEMOVATE) 0.05 %, Apply 1 application topically 2 (two) times daily as needed (skin irritation)., Disp: , Rfl:    diclofenac Sodium (VOLTAREN) 1 % GEL, APPLY 4 GRAMS TOPICALLY TO THE AFFECTED AREA FOUR TIMES DAILY AS NEEDED FOR PAIN, Disp: 300 g, Rfl: 0   fluticasone (FLONASE) 50 MCG/ACT nasal spray, Place 2 sprays into both nostrils  daily., Disp: 16 g, Rfl: 0   gabapentin (NEURONTIN) 100 MG capsule, TAKE 1 CAPSULE BY MOUTH EVERYDAY AT BEDTIME, Disp: , Rfl:    ibuprofen (ADVIL) 600 MG tablet, Take 1 tablet (600 mg total) by mouth every 8 (eight) hours as needed for moderate pain (pain score 4-6)., Disp: 60 tablet, Rfl: 0   levocetirizine (XYZAL) 5 MG tablet, Take 1 tablet (5 mg total) by mouth every evening., Disp: 90 tablet, Rfl: 3   metoCLOPramide (REGLAN) 5 MG tablet, Take 1 tablet (5 mg total) by mouth every 8 (eight) hours as needed for nausea., Disp: 30 tablet, Rfl: 0   montelukast (SINGULAIR) 10 MG tablet, TAKE 1 TABLET BY MOUTH EVERYDAY AT BEDTIME, Disp: 90 tablet, Rfl: 2   nicotine (NICODERM CQ - DOSED IN MG/24 HOURS) 21 mg/24hr patch, RX #1 Weeks 1-4: 21 mg x 1 patch daily. Wear for 24 hours. If you have sleep disturbances, remove at bedtime., Disp: 28 patch, Rfl: 0   ondansetron (ZOFRAN-ODT) 4 MG disintegrating tablet, Take 1-2 tablets (4-8 mg total) by mouth every 8 (eight)  hours as needed., Disp: 20 tablet, Rfl: 0   phentermine (ADIPEX-P) 37.5 MG tablet, Take 1 tablet (37.5 mg total) by mouth daily before breakfast., Disp: 90 tablet, Rfl: 1   predniSONE (STERAPRED UNI-PAK 21 TAB) 10 MG (21) TBPK tablet, Take following package directions., Disp: 21 tablet, Rfl: 0   Semaglutide,0.25 or 0.5MG /DOS, (OZEMPIC, 0.25 OR 0.5 MG/DOSE,) 2 MG/3ML SOPN, Inject 0.25 mg into the skin once a week for 28 days, THEN 0.5 mg once a week., Disp: 3 mL, Rfl: 0   sulfamethoxazole-trimethoprim (BACTRIM DS) 800-160 MG tablet, Take 1 tablet by mouth 2 (two) times daily for 5 days., Disp: 10 tablet, Rfl: 0   tiZANidine (ZANAFLEX) 4 MG capsule, tizanidine 4 mg capsule, Disp: , Rfl:    triamcinolone (KENALOG) 0.1 % paste, Use as directed 1 Application in the mouth or throat 2 (two) times daily., Disp: 5 g, Rfl: 12   varenicline (CHANTIX) 1 MG tablet, TAKE 0.5 TABLETS (0.5 MG TOTAL) BY MOUTH DAILY FOR 7 DAYS, THEN 0.5 TABLETS (0.5 MG TOTAL) 2 (TWO)  TIMES DAILY FOR 7 DAYS, THEN 1 TABLET (1 MG TOTAL) 2 (TWO) TIMES DAILY FOR 16 DAYS., Disp: 129 tablet, Rfl: 1   zolpidem (AMBIEN) 10 MG tablet, Take 1 tablet (10 mg total) by mouth at bedtime as needed. for sleep, Disp: 90 tablet, Rfl: 1  Observations/Objective: Patient is well-developed, well-nourished in no acute distress.  Resting comfortably  at home.  Head is normocephalic, atraumatic.  No labored breathing.  Speech is clear and coherent with logical content.  Patient is alert and oriented at baseline.  Patient's tongue is white.    Assessment and Plan: 1. Oral thrush (Primary) - nystatin (MYCOSTATIN) 100000 UNIT/ML suspension; Take 5 mLs (500,000 Units total) by mouth 4 (four) times daily. Swish and swallow  Dispense: 200 mL; Refill: 0  Start medicine as prescribed. Continue to watch for worsening symptoms. Schedule a virtual appointment or visit an urgent care location if symptoms don't improve.  Pt verbalized understanding and in agreement.    Follow Up Instructions: I discussed the assessment and treatment plan with the patient. The patient was provided an opportunity to ask questions and all were answered. The patient agreed with the plan and demonstrated an understanding of the instructions.  A copy of instructions were sent to the patient via MyChart unless otherwise noted below.   Patient has requested to receive PHI (AVS, Work Notes, etc) pertaining to this video visit through e-mail as they are currently without active MyChart. They have voiced understand that email is not considered secure and their health information could be viewed by someone other than the patient.   The patient was advised to call back or seek an in-person evaluation if the symptoms worsen or if the condition fails to improve as anticipated.    Robin Better, PA-C

## 2024-04-27 ENCOUNTER — Ambulatory Visit (INDEPENDENT_AMBULATORY_CARE_PROVIDER_SITE_OTHER): Payer: Medicaid Other | Admitting: Internal Medicine

## 2024-04-27 ENCOUNTER — Encounter: Payer: Self-pay | Admitting: Internal Medicine

## 2024-04-27 VITALS — BP 114/70 | HR 67 | Ht 61.0 in | Wt 173.0 lb

## 2024-04-27 DIAGNOSIS — E042 Nontoxic multinodular goiter: Secondary | ICD-10-CM

## 2024-04-27 NOTE — Progress Notes (Unsigned)
 Name: Robin Small  MRN/ DOB: [id number], [birth year]    Age/ Sex: 63 y.o., female    PCP: Catheryn Cluck, MD   Reason for Endocrinology Evaluation: MNG     Date of Initial Endocrinology Evaluation: 04/27/2024     HPI: [name] is a 63 y.o. female with a past medical history of DM, mild intermittent asthma, multinodular goiter, dyslipidemia. The patient presented for initial endocrinology clinic visit on 04/27/2024 for consultative assistance with her MNG.   Patient was diagnosed with multinodular goiter for thyroid  ultrasound 01/2024.  She has no prior history of FNAs, none of the nodules meet criteria for FNA's.   Historically she has had normal TFTs.  Denies local neck swelling  Has noted difficulty with controlling her body temperature, went through early menopause . Has noted excessive sweating at night for the past 2 years, but these have been improving due to vitamin D intake.   She follows with gynecology  Denies palpitations  Has mild constipation since being on Ozempic    Sister with thyroid  disease , with subtotal thyroidectomy      HISTORY:  Past Medical History:  Past Medical History:  Diagnosis Date  . Allergy   . Anxiety   . Asthma   . Depression   . Diverticulosis 03/2016   Mild, noted on colonoscopy  . High cholesterol   . History of kidney stones   . Lichen sclerosus    Anus  . Low blood pressure reading   . OA (osteoarthritis)   . OSA on CPAP   . Perianal cyst   . Personal history of colonic polyps 04/06/2011  . PONV (postoperative nausea and vomiting)    after c section, did well after knee replacement  . PPD positive    Age 77 >> reports taking medicine for one year  . Pre-diabetes    per pt report  . Seasonal allergies   . Sleep apnea   . Thyroid  nodule 02/04/2024  . Type 2 diabetes mellitus without complication, without long-term current use of insulin (HCC) 05/27/2023   Past Surgical History:  Past Surgical History:   Procedure Laterality Date  . ARTHROSCOPIC KNEE Left   . CESAREAN SECTION    . COLONOSCOPY W/ POLYPECTOMY  04/11/2016  . ENDOMETRIAL ABLATION    . GANGLION CYST EXCISION Right   . HEMORROIDECTOMY    . JOINT REPLACEMENT  both knees 2020  . lichen sclerosus lesion excision     Anus  . TOTAL KNEE ARTHROPLASTY Left 03/03/2019   Procedure: TOTAL KNEE ARTHROPLASTY;  Surgeon: Dayne Even, MD;  Location: [facility];  Service: Orthopedics;  Laterality: Left;  . TOTAL KNEE ARTHROPLASTY Right 09/15/2019   Procedure: RIGHT TOTAL KNEE ARTHROPLASTY;  Surgeon: Dayne Even, MD;  Location: [facility];  Service: Orthopedics;  Laterality: Right;  . TUBAL LIGATION      Social History:  reports that she has been smoking cigarettes. She started smoking about 34 years ago. She has a 15 pack-year smoking history. She has never been exposed to tobacco smoke. She has never used smokeless tobacco. She reports that she does not currently use alcohol. She reports that she does not use drugs. Family History: family history includes Alcohol abuse in her brother; Arthritis in her mother; Breast cancer in her maternal grandmother; Colon polyps in her mother; Diabetes in her mother; Heart disease in her mother; Hepatitis in her brother; Hyperlipidemia in her mother; Hypertension in her mother; Kidney disease in her mother;  Lung cancer in her father; Stroke in her mother; Varicose Veins in her sister.   HOME MEDICATIONS: Allergies as of 04/27/2024       Reactions   Codeine Nausea Only        Medication List        Accurate as of Apr 27, 2024  7:21 AM. If you have any questions, ask your nurse or doctor.          acetaminophen  500 MG tablet Commonly known as: TYLENOL  Take 1,000 mg by mouth every 6 (six) hours as needed for moderate pain.   albuterol  108 (90 Base) MCG/ACT inhaler Commonly known as: VENTOLIN  HFA Inhale 1-2 puffs into the lungs every 6 (six) hours as needed.   atorvastatin  20 MG  tablet Commonly known as: LIPITOR TAKE 1 TABLET BY MOUTH EVERY DAY   Azelastine  HCl 137 MCG/SPRAY Soln SPRAY ONCE IN EACH NOSTRIL TWICE DAILY AS DIRECTED   benzonatate  100 MG capsule Commonly known as: TESSALON  Take 1-2 capsules (100-200 mg total) by mouth 3 (three) times daily as needed.   brompheniramine-pseudoephedrine -DM 30-2-10 MG/5ML syrup Take 5 mLs by mouth 4 (four) times daily as needed.   Calcium -D 600-400 MG-UNIT Tabs Take 2 tablets by mouth at bedtime.   Cholecalciferol 1.25 MG (50000 UT) capsule Take 1 capsule every week by oral route.   clobetasol cream 0.05 % Commonly known as: TEMOVATE Apply 1 application topically 2 (two) times daily as needed (skin irritation).   diclofenac  Sodium 1 % Gel Commonly known as: VOLTAREN  APPLY 4 GRAMS TOPICALLY TO THE AFFECTED AREA FOUR TIMES DAILY AS NEEDED FOR PAIN   fluticasone  50 MCG/ACT nasal spray Commonly known as: FLONASE  Place 2 sprays into both nostrils daily.   gabapentin 100 MG capsule Commonly known as: NEURONTIN TAKE 1 CAPSULE BY MOUTH EVERYDAY AT BEDTIME   ibuprofen 600 MG tablet Commonly known as: ADVIL Take 1 tablet (600 mg total) by mouth every 8 (eight) hours as needed for moderate pain (pain score 4-6).   levocetirizine 5 MG tablet Commonly known as: XYZAL  Take 1 tablet (5 mg total) by mouth every evening.   metoCLOPramide  5 MG tablet Commonly known as: Reglan  Take 1 tablet (5 mg total) by mouth every 8 (eight) hours as needed for nausea.   montelukast  10 MG tablet Commonly known as: SINGULAIR  TAKE 1 TABLET BY MOUTH EVERYDAY AT BEDTIME   nicotine  21 mg/24hr patch Commonly known as: NICODERM CQ  - dosed in mg/24 hours RX #1 Weeks 1-4: 21 mg x 1 patch daily. Wear for 24 hours. If you have sleep disturbances, remove at bedtime.   nystatin  100000 UNIT/ML suspension Commonly known as: MYCOSTATIN  Take 5 mLs (500,000 Units total) by mouth 4 (four) times daily. Swish and swallow   ondansetron  4 MG  disintegrating tablet Commonly known as: ZOFRAN -ODT Take 1-2 tablets (4-8 mg total) by mouth every 8 (eight) hours as needed.   Ozempic  (0.25 or 0.5 MG/DOSE) 2 MG/3ML Sopn Generic drug: Semaglutide (0.25 or 0.5MG /DOS) SMARTSIG:1 unspecified SUB-Q Once a Week   phentermine  37.5 MG tablet Commonly known as: ADIPEX-P  Take 1 tablet (37.5 mg total) by mouth daily before breakfast.   predniSONE  10 MG (21) Tbpk tablet Commonly known as: STERAPRED UNI-PAK 21 TAB Take following package directions.   tiZANidine  4 MG capsule Commonly known as: ZANAFLEX  tizanidine  4 mg capsule   triamcinolone  0.1 % paste Commonly known as: KENALOG  Use as directed 1 Application in the mouth or throat 2 (two) times daily.   varenicline  1 MG  tablet Commonly known as: CHANTIX  Take by mouth.   zolpidem  10 MG tablet Commonly known as: AMBIEN  Take 1 tablet (10 mg total) by mouth at bedtime as needed. for sleep          REVIEW OF SYSTEMS: A comprehensive ROS was conducted with the patient and is negative except as per HPI and below:  ROS     OBJECTIVE:  VS: There were no vitals taken for this visit.   Wt Readings from Last 3 Encounters:  01/31/24 173 lb 3.2 oz (78.6 kg)  09/27/23 178 lb 6.4 oz (80.9 kg)  05/27/23 177 lb 12.8 oz (80.6 kg)     EXAM: General: Pt appears well and is in NAD  Neck: General: Supple without adenopathy. Thyroid : Thyroid  size normal.  No goiter or nodules appreciated. No thyroid  bruit.  Lungs: Clear with good BS bilat   Heart: Auscultation: RRR.  Abdomen: Soft, nontender  Extremities:  BL LE: No pretibial edema   Mental Status: Judgment, insight: Intact Orientation: Oriented to time, place, and person Mood and affect: No depression, anxiety, or agitation     DATA REVIEWED:  Latest Reference Range & Units 02/07/24 08:16  TSH 0.40 - 4.50 mIU/L 0.99  Thyroxine (T4) 5.1 - 11.9 mcg/dL 7.3  Free Thyroxine Index 1.4 - 3.8  2.3  T3 Uptake 22 - 35 % 32       Thyroid  ultrasound 01/06/2024  Estimated total number of nodules >/= 1 cm: 2   Number of spongiform nodules >/=  2 cm not described below (TR1): 0   Number of mixed cystic and solid nodules >/= 1.5 cm not described below (TR2): 0   _________________________________________________________   Nodule # 1:   Location: Isthmus; mid   Maximum size: 1.4 cm; Other 2 dimensions: 0.6 x 0.8 cm   Composition: solid/almost completely solid (2)   Echogenicity: hypoechoic (2)   Shape: not taller-than-wide (0)   Margins: smooth (0)   Echogenic foci: none (0)   ACR TI-RADS total points: 4.   ACR TI-RADS risk category: TR4 (4-6 points).   ACR TI-RADS recommendations:   *Given size (>/= 1 - 1.4 cm) and appearance, a follow-up ultrasound in 1 year should be considered based on TI-RADS criteria.   _________________________________________________________   Nodule 2: 1.0 x 0.9 x 0.5 cm spongiform nodule in the inferior right thyroid  lobe (TI-RADS 1) does not meet criteria for imaging surveillance or FNA.   _________________________________________________________   Remaining subcentimeter bilateral thyroid  nodules do not meet criteria for FNA or imaging follow-up.   IMPRESSION: Nodule 1 (TI-RADS 4), measuring 1.4 cm, located in the isthmus meets criteria for imaging follow-up. Annual ultrasound surveillance is recommended until 5 years of stability is documented.   ASSESSMENT/PLAN/RECOMMENDATIONS:   Multinodular Goiter:  - No local neck symptoms -  Medications :  Signed electronically by: Natale Bail, MD  [facility]  [facility] [address] [place], [place] [address] Phone: [phone] FAX: [phone]   CC: Catheryn Cluck, MD [address] [place] [address] Phone: [phone] Fax: [phone]   Return to Endocrinology clinic as below: Future Appointments  Date Time Provider  Department Center  04/27/2024 11:30 AM Shivansh Hardaway, Julian Obey, MD [facility] None  05/08/2024  8:00 AM Catheryn Cluck, MD [facility]  05/22/2024  9:00 AM Margaretann Sharper, MD [facility] None  06/01/2024 10:40 AM Arleen Lacer, MD [facility]

## 2024-04-28 DIAGNOSIS — E042 Nontoxic multinodular goiter: Secondary | ICD-10-CM | POA: Insufficient documentation

## 2024-05-04 ENCOUNTER — Ambulatory Visit: Payer: Medicaid Other | Admitting: Family Medicine

## 2024-05-08 ENCOUNTER — Ambulatory Visit (INDEPENDENT_AMBULATORY_CARE_PROVIDER_SITE_OTHER): Admitting: Family Medicine

## 2024-05-08 ENCOUNTER — Encounter: Payer: Self-pay | Admitting: Family Medicine

## 2024-05-08 VITALS — BP 118/69 | HR 57 | Temp 97.0°F | Resp 18 | Wt 170.2 lb

## 2024-05-08 DIAGNOSIS — E119 Type 2 diabetes mellitus without complications: Secondary | ICD-10-CM

## 2024-05-08 DIAGNOSIS — J301 Allergic rhinitis due to pollen: Secondary | ICD-10-CM | POA: Diagnosis not present

## 2024-05-08 DIAGNOSIS — Z7985 Long-term (current) use of injectable non-insulin antidiabetic drugs: Secondary | ICD-10-CM | POA: Diagnosis not present

## 2024-05-08 DIAGNOSIS — G4733 Obstructive sleep apnea (adult) (pediatric): Secondary | ICD-10-CM

## 2024-05-08 DIAGNOSIS — J431 Panlobular emphysema: Secondary | ICD-10-CM | POA: Insufficient documentation

## 2024-05-08 DIAGNOSIS — E66811 Obesity, class 1: Secondary | ICD-10-CM

## 2024-05-08 DIAGNOSIS — L739 Follicular disorder, unspecified: Secondary | ICD-10-CM | POA: Insufficient documentation

## 2024-05-08 DIAGNOSIS — Z6832 Body mass index (BMI) 32.0-32.9, adult: Secondary | ICD-10-CM

## 2024-05-08 DIAGNOSIS — E6609 Other obesity due to excess calories: Secondary | ICD-10-CM

## 2024-05-08 DIAGNOSIS — I700 Atherosclerosis of aorta: Secondary | ICD-10-CM | POA: Diagnosis not present

## 2024-05-08 HISTORY — DX: Panlobular emphysema: J43.1

## 2024-05-08 LAB — POCT GLYCOSYLATED HEMOGLOBIN (HGB A1C): Hemoglobin A1C: 5.8 % — AB (ref 4.0–5.6)

## 2024-05-08 MED ORDER — AZELASTINE HCL 137 MCG/SPRAY NA SOLN: NASAL | 11 refills | Status: AC

## 2024-05-08 MED ORDER — DOXYCYCLINE HYCLATE 100 MG PO TABS: 100.0000 mg | ORAL_TABLET | Freq: Two times a day (BID) | ORAL | 0 refills | Status: AC

## 2024-05-08 MED ORDER — FLUTICASONE PROPIONATE 50 MCG/ACT NA SUSP: 2.0000 | Freq: Every day | NASAL | 0 refills | Status: DC

## 2024-05-08 MED ORDER — FEXOFENADINE HCL 180 MG PO TABS
180.0000 mg | ORAL_TABLET | Freq: Every day | ORAL | 0 refills | Status: AC
Start: 2024-05-08 — End: ?

## 2024-05-08 MED ORDER — SEMAGLUTIDE (1 MG/DOSE) 4 MG/3ML ~~LOC~~ SOPN: 1.0000 mg | PEN_INJECTOR | SUBCUTANEOUS | 0 refills | Status: DC

## 2024-05-08 NOTE — Progress Notes (Signed)
 Assessment & Plan   Assessment/Plan:    Assessment & Plan Type 2 diabetes mellitus Improved glycemic control and weight loss since increasing Ozempic  to 0.5 mg. No polyuria, polydipsia, or other symptoms of hyperglycemia. Mild constipation from Ozempic  managed with OTC stool softeners. - Increase Ozempic  to 1 mg for enhanced diabetes and weight management.  Obesity Approximately 15 pounds weight loss since increasing Ozempic , contributing to improvement in sleep apnea and joint pain. - Continue Ozempic  for weight management. - Encourage healthy lifestyle with walking, dancing, and a low carb, low fat diet rich in fruits and vegetables.  Obstructive sleep apnea Non-compliance to CPAP due to side effects and perceived improvement with weight loss. Scheduled follow-up with pulmonology.  Chronic obstructive pulmonary disease (COPD) COPD components identified on recent CT lung cancer screening. Scheduled follow-up with pulmonology for COPD management discussion.  Aortic atherosclerosis Aortic atherosclerosis identified on CT lung scan. Referred to cardiology by OBGYN for further evaluation.  Bilateral knee pain Improved with weight loss and increased activity.  Left ankle pain Improved with orthopedic intervention and steroid injections. Increased activity with dancing and walking.  Seasonal allergic rhinitis No improvement on current regimen of Flonase , azelastine , and cetirizine . Some improvement with Benadryl . - Transition from cetirizine  to fexofenadine 150 mg daily. - Continue Flonase  and azelastine .  Left axillary folliculitis Recently drained with mild erythema, no drainage or tenderness. No systemic symptoms. Family history of similar condition. - Treat with doxycycline  100 mg bid for 7 days. - Follow up if no improvement within one week.      Medications Discontinued During This Encounter  Medication Reason   varenicline  (CHANTIX ) 1 MG tablet    OZEMPIC , 0.25 OR  0.5 MG/DOSE, 2 MG/3ML SOPN    levocetirizine (XYZAL ) 5 MG tablet    gabapentin (NEURONTIN) 100 MG capsule    cetirizine  (ZYRTEC ) 5 MG chewable tablet    Azelastine  HCl 137 MCG/SPRAY SOLN Reorder   fluticasone  (FLONASE ) 50 MCG/ACT nasal spray Reorder    Return in about 3 months (around 08/08/2024) for DM, weight management.        Subjective:   Encounter date: 05/08/2024  Robin Small is a 63 y.o. female who has Dyslipidemia; ANXIETY DEPRESSION; Mild intermittent asthma; LUMBAR SPRAIN AND STRAIN; External hemorrhoids; Muscle ache; Dyspnea; Sleep apnea; Sinusitis, acute; History of colonic polyps; Seasonal and perennial allergic rhinitis; Primary osteoarthritis of left knee; Primary osteoarthritis of right knee; Lichen sclerosus; S/P hemorrhoidectomy; TMJ (temporomandibular joint syndrome); Seasonal allergic rhinitis due to pollen; Class 1 obesity with serious comorbidity and body mass index (BMI) of 33.0 to 33.9 in adult; Sleep disturbance; Hemorrhoids, internal; Chronic pain of both knees; Tobacco abuse; Hyperlipidemia; Post-operative nausea and vomiting; Type 2 diabetes mellitus without complication, without long-term current use of insulin (HCC); Chronic pain of left ankle; Chronic midline low back pain with left-sided sciatica; Extrinsic asthma; Type 2 diabetes mellitus with neurological complications (HCC); Thyroid  nodule; Aortic atherosclerosis (HCC); Multinodular goiter; Panlobular emphysema (HCC); and Folliculitis of axilla on their problem list..   She  has a past medical history of Allergy, Anxiety, Asthma, Depression, Diverticulosis (03/2016), High cholesterol, History of kidney stones, Lichen sclerosus, Low blood pressure reading, OA (osteoarthritis), OSA on CPAP, Panlobular emphysema (HCC) (05/08/2024), Perianal cyst, Personal history of colonic polyps (04/06/2011), PONV (postoperative nausea and vomiting), PPD positive, Pre-diabetes, Seasonal allergies, Sleep apnea, Thyroid  nodule  (02/04/2024), and Type 2 diabetes mellitus without complication, without long-term current use of insulin (HCC) (05/27/2023)..   She presents with chief complaint of Diabetes (  3 month follow up DM. Pt is fasting today //HM- diabetic eye exam and shingles vaccine ( Pt declined vaccine) ), Ankle Pain (Pt c/o of left ankle discomfort with gait concerns for 1 year ), and seasonal allergies (Pt c/o of runny nose, sneezing, watery eyes and nasal congestion since season change and pollen. Rx refill for Flonase  and zyrtec ) .   Discussed the use of AI scribe software for clinical note transcription with the patient, who gave verbal consent to proceed.  History of Present Illness Robin Small is a 63 year old female with diabetes who presents for follow-up.  She has experienced improvement in her diabetes management since increasing Ozempic  to 0.5 mg, resulting in a weight loss of approximately 15 pounds. She has resumed activities such as dancing and walking, which she had not done for several years due to left ankle pain and bilateral knee pain. No polyuria, polydipsia, chest pain, shortness of breath, headache, or blurry vision. Mild constipation from Ozempic  is managed with over-the-counter stool softeners.  Her left ankle pain has improved following steroid injections administered by orthopedics.  She is attempting to reduce smoking but experienced nausea with varenicline . A recent CT lung cancer screening revealed COPD components and aortic atherosclerosis. She is already on a statin and has a scheduled follow-up with pulmonology for COPD and sleep apnea. She is not currently using her CPAP due to side effects from excessive air blowing and feels it may not be necessary after her weight loss.  She has a history of seasonal allergies and is using Flonase , azelastine , and over-the-counter Zyrtec  without improvement. She recently added Benadryl  and plans to transition from cetirizine  to fexofenadine 150 mg  daily.  She recently had left axillary folliculitis, which was drained and is currently non-tender with mild erythema. There are no other surrounding lesions, fevers, or chills. She has a family history of similar conditions.   ROS  Past Surgical History:  Procedure Laterality Date   ARTHROSCOPIC KNEE Left    CESAREAN SECTION     COLONOSCOPY W/ POLYPECTOMY  04/11/2016   ENDOMETRIAL ABLATION     GANGLION CYST EXCISION Right    HEMORROIDECTOMY     JOINT REPLACEMENT  both knees 2020   lichen sclerosus lesion excision     Anus   TOTAL KNEE ARTHROPLASTY Left 03/03/2019   Procedure: TOTAL KNEE ARTHROPLASTY;  Surgeon: Dayne Even, MD;  Location: [facility];  Service: Orthopedics;  Laterality: Left;   TOTAL KNEE ARTHROPLASTY Right 09/15/2019   Procedure: RIGHT TOTAL KNEE ARTHROPLASTY;  Surgeon: Dayne Even, MD;  Location: [facility];  Service: Orthopedics;  Laterality: Right;   TUBAL LIGATION      Outpatient Medications Prior to Visit  Medication Sig Dispense Refill   acetaminophen  (TYLENOL ) 500 MG tablet Take 1,000 mg by mouth every 6 (six) hours as needed for moderate pain.     albuterol  (VENTOLIN  HFA) 108 (90 Base) MCG/ACT inhaler Inhale 1-2 puffs into the lungs every 6 (six) hours as needed. 8 g 0   atorvastatin  (LIPITOR) 20 MG tablet TAKE 1 TABLET BY MOUTH EVERY DAY 90 tablet 3   Calcium  Carbonate-Vitamin D (CALCIUM -D) 600-400 MG-UNIT TABS Take 2 tablets by mouth at bedtime.     Cholecalciferol 1.25 MG (50000 UT) capsule Take 1 capsule every week by oral route.     clobetasol cream (TEMOVATE) 0.05 % Apply 1 application topically 2 (two) times daily as needed (skin irritation).     diclofenac  Sodium (VOLTAREN ) 1 % GEL APPLY 4  GRAMS TOPICALLY TO THE AFFECTED AREA FOUR TIMES DAILY AS NEEDED FOR PAIN 300 g 0   ibuprofen (ADVIL) 600 MG tablet Take 1 tablet (600 mg total) by mouth every 8 (eight) hours as needed for moderate pain (pain score 4-6). 60 tablet 0   metFORMIN  (GLUCOPHAGE ) 500 MG  tablet Take 2 tablets by mouth 2 (two) times daily with a meal.     montelukast  (SINGULAIR ) 10 MG tablet TAKE 1 TABLET BY MOUTH EVERYDAY AT BEDTIME 90 tablet 2   nicotine  (NICODERM CQ  - DOSED IN MG/24 HOURS) 21 mg/24hr patch RX #1 Weeks 1-4: 21 mg x 1 patch daily. Wear for 24 hours. If you have sleep disturbances, remove at bedtime. 28 patch 0   nystatin  (MYCOSTATIN ) 100000 UNIT/ML suspension Take 5 mLs (500,000 Units total) by mouth 4 (four) times daily. Swish and swallow 200 mL 0   ondansetron  (ZOFRAN -ODT) 4 MG disintegrating tablet Take 1-2 tablets (4-8 mg total) by mouth every 8 (eight) hours as needed. 20 tablet 0   tiZANidine  (ZANAFLEX ) 4 MG capsule tizanidine  4 mg capsule     triamcinolone  (KENALOG ) 0.1 % paste Use as directed 1 Application in the mouth or throat 2 (two) times daily. 5 g 12   zolpidem  (AMBIEN ) 10 MG tablet Take 1 tablet (10 mg total) by mouth at bedtime as needed. for sleep 90 tablet 1   Azelastine  HCl 137 MCG/SPRAY SOLN SPRAY ONCE IN EACH NOSTRIL TWICE DAILY AS DIRECTED 30 mL 11   cetirizine  (ZYRTEC ) 5 MG chewable tablet Chew 5 mg by mouth daily.     fluticasone  (FLONASE ) 50 MCG/ACT nasal spray Place 2 sprays into both nostrils daily. 16 g 0   OZEMPIC , 0.25 OR 0.5 MG/DOSE, 2 MG/3ML SOPN SMARTSIG:1 unspecified SUB-Q Once a Week     varenicline  (CHANTIX ) 1 MG tablet Take by mouth.     benzonatate  (TESSALON ) 100 MG capsule Take 1-2 capsules (100-200 mg total) by mouth 3 (three) times daily as needed. (Patient not taking: Reported on 05/08/2024) 30 capsule 0   brompheniramine-pseudoephedrine -DM 30-2-10 MG/5ML syrup Take 5 mLs by mouth 4 (four) times daily as needed. (Patient not taking: Reported on 05/08/2024) 120 mL 0   metoCLOPramide  (REGLAN ) 5 MG tablet Take 1 tablet (5 mg total) by mouth every 8 (eight) hours as needed for nausea. 30 tablet 0   phentermine  (ADIPEX-P ) 37.5 MG tablet Take 1 tablet (37.5 mg total) by mouth daily before breakfast. 90 tablet 1   predniSONE   (STERAPRED UNI-PAK 21 TAB) 10 MG (21) TBPK tablet Take following package directions. (Patient not taking: Reported on 05/08/2024) 21 tablet 0   gabapentin (NEURONTIN) 100 MG capsule TAKE 1 CAPSULE BY MOUTH EVERYDAY AT BEDTIME (Patient not taking: Reported on 05/08/2024)     levocetirizine (XYZAL ) 5 MG tablet Take 1 tablet (5 mg total) by mouth every evening. (Patient not taking: Reported on 05/08/2024) 90 tablet 3   No facility-administered medications prior to visit.    Family History  Problem Relation Age of Onset   Colon polyps Mother    Diabetes Mother    Hypertension Mother    Heart disease Mother    Hyperlipidemia Mother    Arthritis Mother    Kidney disease Mother    Stroke Mother    Lung cancer Father    Varicose Veins Sister    Alcohol abuse Brother    Hepatitis Brother        hep c   Breast cancer Maternal Grandmother  breast   Colon cancer Neg Hx    Esophageal cancer Neg Hx    Rectal cancer Neg Hx    Stomach cancer Neg Hx     Social History   Socioeconomic History   Marital status: Married    Spouse name: Not on file   Number of children: Not on file   Years of education: Not on file   Highest education level: Associate degree: academic program  Occupational History   Occupation: internet support  Tobacco Use   Smoking status: Some Days    Current packs/day: 0.00    Average packs/day: 0.5 packs/day for 30.0 years (15.0 ttl pk-yrs)    Types: Cigarettes    Start date: 07/30/1989    Last attempt to quit: 07/31/2019    Years since quitting: 4.7    Passive exposure: Never   Smokeless tobacco: Never   Tobacco comments:    1 pack per week on and off  Vaping Use   Vaping status: Never Used  Substance and Sexual Activity   Alcohol use: Not Currently    Comment: Maybe once a month   Drug use: No   Sexual activity: Yes    Partners: Male    Birth control/protection: Post-menopausal, Surgical  Other Topics Concern   Not on file  Social History Narrative    Exercise-- daily for 1 hour   Social Drivers of Health   Financial Resource Strain: High Risk (01/30/2024)   Overall Financial Resource Strain (CARDIA)    Difficulty of Paying Living Expenses: Very hard  Food Insecurity: Food Insecurity Present (01/30/2024)   Hunger Vital Sign    Worried About Running Out of Food in the Last Year: Sometimes true    Ran Out of Food in the Last Year: Sometimes true  Transportation Needs: No Transportation Needs (01/30/2024)   PRAPARE - Administrator, Civil Service (Medical): No    Lack of Transportation (Non-Medical): No  Physical Activity: Insufficiently Active (01/30/2024)   Exercise Vital Sign    Days of Exercise per Week: 2 days    Minutes of Exercise per Session: 30 min  Stress: Stress Concern Present (01/30/2024)   [employer] of Occupational Health - Occupational Stress Questionnaire    Feeling of Stress : To some extent  Social Connections: Moderately Integrated (01/30/2024)   Social Connection and Isolation Panel [NHANES]    Frequency of Communication with Friends and Family: More than three times a week    Frequency of Social Gatherings with Friends and Family: More than three times a week    Attends Religious Services: Never    Database administrator or Organizations: Yes    Attends Engineer, structural: More than 4 times per year    Marital Status: Married  Catering manager Violence: Not on file                                                                                                  Objective:  Physical Exam: BP 118/69 (BP Location: Left Arm, Patient Position: Sitting, Cuff Size: Large)   Pulse (!) 57  Temp (!) 97 F (36.1 C) (Temporal)   Resp 18   Wt 170 lb 3.2 oz (77.2 kg)   BMI 32.16 kg/m   Wt Readings from Last 3 Encounters:  05/08/24 170 lb 3.2 oz (77.2 kg)  04/27/24 173 lb (78.5 kg)  01/31/24 173 lb 3.2 oz (78.6 kg)    Physical Exam GENERAL: Alert, cooperative, well developed, no  acute distress. HEENT: Normocephalic, normal oropharynx, moist mucous membranes. CHEST: Clear to auscultation bilaterally, no wheezes, rhonchi, or crackles. CARDIOVASCULAR: Normal heart rate and rhythm, S1 and S2 normal without murmurs. ABDOMEN: Soft, non-tender, non-distended, without organomegaly, normal bowel sounds. EXTREMITIES: No cyanosis or edema. NEUROLOGICAL: Cranial nerves grossly intact, moves all extremities without gross motor or sensory deficit. SKIN: Left axillary folliculitis, recently drained, no drainage, non-tender, 1 cm mild erythema.   Physical Exam  CT CHEST LUNG CA SCREEN LOW DOSE W/O CM Result Date: 02/26/2024 CLINICAL DATA:  63 year old female former smoker (quit 30 days ago) with 46 pack-year history of smoking. Lung cancer screening examination. EXAM: CT CHEST WITHOUT CONTRAST LOW-DOSE FOR LUNG CANCER SCREENING TECHNIQUE: Multidetector CT imaging of the chest was performed following the standard protocol without IV contrast. RADIATION DOSE REDUCTION: This exam was performed according to the departmental dose-optimization program which includes automated exposure control, adjustment of the mA and/or kV according to patient size and/or use of iterative reconstruction technique. COMPARISON:  Low-dose lung cancer screening chest CT 02/11/2023. FINDINGS: Cardiovascular: Heart size is normal. There is no significant pericardial fluid, thickening or pericardial calcification. There is aortic atherosclerosis, as well as atherosclerosis of the great vessels of the mediastinum and the coronary arteries, including calcified atherosclerotic plaque in the left anterior descending coronary artery. Mediastinum/Nodes: No pathologically enlarged mediastinal or hilar lymph nodes. Please note that accurate exclusion of hilar adenopathy is limited on noncontrast CT scans. Esophagus is unremarkable in appearance. No axillary lymphadenopathy. Lungs/Pleura: Small pulmonary nodules are noted, largest  of which is in the right middle lobe abutting the minor fissure (axial image 140) with a volume derived mean diameter 5.7 mm, similar in retrospect to the prior study. No larger more suspicious appearing pulmonary nodules or masses are noted. No acute consolidative airspace disease. No pleural effusions. Mild diffuse bronchial wall thickening with very mild centrilobular and paraseptal emphysema. Upper Abdomen: Unremarkable. Musculoskeletal: There are no aggressive appearing lytic or blastic lesions noted in the visualized portions of the skeleton. IMPRESSION: 1. Lung-RADS 2S, benign appearance or behavior. Continue annual screening with low-dose chest CT without contrast in 12 months. 2. The "S" modifier above refers to potentially clinically significant non lung cancer related findings. Specifically, there is aortic atherosclerosis, in addition to left anterior descending coronary artery disease. Please note that although the presence of coronary artery calcium  documents the presence of coronary artery disease, the severity of this disease and any potential stenosis cannot be assessed on this non-gated CT examination. Assessment for potential risk factor modification, dietary therapy or pharmacologic therapy may be warranted, if clinically indicated. 3. Mild diffuse bronchial wall thickening with very mild centrilobular and paraseptal emphysema; imaging findings suggestive of underlying COPD. Aortic Atherosclerosis (ICD10-I70.0) and Emphysema (ICD10-J43.9). Electronically Signed   By: Alexandria Angel M.D.   On: 02/26/2024 09:17    Recent Results (from the past 2160 hours)  POCT glycosylated hemoglobin (Hb A1C)     Status: Abnormal   Collection Time: 05/08/24  8:15 AM  Result Value Ref Range   Hemoglobin A1C 5.8 (A) 4.0 - 5.6 %   HbA1c  POC (<> result, manual entry)     HbA1c, POC (prediabetic range)     HbA1c, POC (controlled diabetic range)          Carnell Christian, MD, MS

## 2024-05-08 NOTE — Patient Instructions (Signed)
  VISIT SUMMARY: Today, you had a follow-up appointment to discuss your diabetes management and other health concerns. You have shown improvement in your diabetes control and weight loss since increasing your Ozempic  dose. We also reviewed your recent CT scan results and discussed your ongoing issues with sleep apnea, COPD, and seasonal allergies.  YOUR PLAN: -TYPE 2 DIABETES MELLITUS: Type 2 diabetes is a condition where your body does not use insulin properly, leading to high blood sugar levels. Your diabetes management has improved with the increased dose of Ozempic , and you have experienced weight loss. We will increase your Ozempic  dose to 1 mg to further help with diabetes and weight management. Continue using over-the-counter stool softeners for mild constipation.  -OBESITY: Obesity is a condition characterized by excessive body fat. You have lost approximately 15 pounds, which has helped improve your sleep apnea and joint pain. Continue using Ozempic  and maintain a healthy lifestyle with regular walking, dancing, and a diet low in carbs and fats, rich in fruits and vegetables.  -OBSTRUCTIVE SLEEP APNEA: Obstructive sleep apnea is a condition where your breathing stops and starts during sleep. You have not been using your CPAP machine due to side effects and feel better after weight loss. You have a follow-up scheduled with pulmonology to discuss this further.  -CHRONIC OBSTRUCTIVE PULMONARY DISEASE (COPD): COPD is a chronic inflammatory lung disease that obstructs airflow from the lungs. Your recent CT scan showed COPD components, and you have a follow-up scheduled with pulmonology to discuss management options.  -AORTIC ATHEROSCLEROSIS: Aortic atherosclerosis is the buildup of fats, cholesterol, and other substances in and on the artery walls. This was identified in your recent CT scan, and you have been referred to cardiology for further evaluation.  -BILATERAL KNEE PAIN: Bilateral knee pain  refers to pain in both knees. Your knee pain has improved with weight loss and increased activity. Continue with your current activities.  -LEFT ANKLE PAIN: Left ankle pain has been a concern but has improved with steroid injections. You have been able to increase your activity levels, including dancing and walking.  -SEASONAL ALLERGIC RHINITIS: Seasonal allergic rhinitis is an allergic reaction to pollen and other allergens. You have not seen improvement with your current medications. We will switch from cetirizine  to fexofenadine 150 mg daily and continue using Flonase  and azelastine .  -LEFT AXILLARY FOLLICULITIS: Left axillary folliculitis is an infection of the hair follicles under your left arm. It was recently drained and is currently showing mild redness without tenderness. We will treat it with doxycycline  100 mg twice daily for 7 days. Follow up if there is no improvement within one week.  INSTRUCTIONS: Please follow up with pulmonology for your COPD and sleep apnea as scheduled. Additionally, follow up with cardiology  for further evaluation of your aortic atherosclerosis. If your left axillary folliculitis does not improve within one week, please schedule another appointment.

## 2024-05-21 ENCOUNTER — Encounter: Payer: Self-pay | Admitting: Family Medicine

## 2024-05-22 ENCOUNTER — Other Ambulatory Visit: Payer: Self-pay | Admitting: Pulmonary Disease

## 2024-05-22 ENCOUNTER — Encounter: Payer: Self-pay | Admitting: Pulmonary Disease

## 2024-05-22 ENCOUNTER — Ambulatory Visit (INDEPENDENT_AMBULATORY_CARE_PROVIDER_SITE_OTHER): Admitting: Pulmonary Disease

## 2024-05-22 VITALS — BP 105/69 | HR 64 | Ht 61.0 in | Wt 169.0 lb

## 2024-05-22 DIAGNOSIS — G4733 Obstructive sleep apnea (adult) (pediatric): Secondary | ICD-10-CM | POA: Diagnosis not present

## 2024-05-22 DIAGNOSIS — Z87891 Personal history of nicotine dependence: Secondary | ICD-10-CM

## 2024-05-22 DIAGNOSIS — G479 Sleep disorder, unspecified: Secondary | ICD-10-CM

## 2024-05-22 MED ORDER — ZOLPIDEM TARTRATE 10 MG PO TABS: 10.0000 mg | ORAL_TABLET | Freq: Every evening | ORAL | 1 refills | Status: DC | PRN

## 2024-05-22 NOTE — Patient Instructions (Signed)
 We will schedule you for a sleep study  Schedule for in-lab sleep study Past history of sleep apnea  Continue weight loss efforts  Congratulations about quitting smoking  Follow-up in 3 to 4 months

## 2024-05-22 NOTE — Progress Notes (Signed)
 Robin Small    [id number]    December 03, 1961  Primary Care Physician:Catheryn Cluck, MD  Referring Physician: Catheryn Cluck, MD [address] [place],  [place] [address]  Chief complaint:   History of obstructive sleep apnea  HPI:  Sleep apnea diagnosed in 2014 Severe sleep apnea with severe oxygen  desaturations  Stopped using CPAP secondary to intolerance  Having some recurrence of symptoms, daytime sleepiness Waking up feeling like she did not rest well  She also does have associated insomnia for which she takes Ambien  Without the Ambien  she does not get any rest at all Usually goes to bed about 11, takes about an hour to fall asleep usually wakes about 6 to 7 AM in the morning With Ambien  if she gets enough sleep, she will feel like she is rested but otherwise wakes up not rested  She does have occasional dryness of her mouth in the morning Does have night sweats Some difficulty with her short-term memory Usual focus is good  Able to quit smoking about 3 months ago  Trying to stay active  Had knee surgery in 2020, did have some complications and was not able to ambulate well, gained some weight but has been losing weight since  Has chronic nasal congestion she has tried multiple interventions with no good relief  Recently had a low-dose CT scan screening which showed lung nodules reportedly perifissural on the left, there is also an anterior right midlung nodule  She states she is able to walk up to a mile if she takes her time and will primarily stop because of pain in her knees  Outpatient Encounter Medications as of 05/22/2024  Medication Sig   acetaminophen  (TYLENOL ) 500 MG tablet Take 1,000 mg by mouth every 6 (six) hours as needed for moderate pain.   albuterol  (VENTOLIN  HFA) 108 (90 Base) MCG/ACT inhaler Inhale 1-2 puffs into the lungs every 6 (six) hours as needed.   atorvastatin  (LIPITOR) 20 MG tablet TAKE 1 TABLET BY MOUTH EVERY DAY    Azelastine  HCl 137 MCG/SPRAY SOLN SPRAY ONCE IN EACH NOSTRIL TWICE DAILY AS DIRECTED   brompheniramine-pseudoephedrine -DM 30-2-10 MG/5ML syrup Take 5 mLs by mouth 4 (four) times daily as needed.   Calcium  Carbonate-Vitamin D (CALCIUM -D) 600-400 MG-UNIT TABS Take 2 tablets by mouth at bedtime.   clobetasol cream (TEMOVATE) 0.05 % Apply 1 application topically 2 (two) times daily as needed (skin irritation).   diclofenac  Sodium (VOLTAREN ) 1 % GEL APPLY 4 GRAMS TOPICALLY TO THE AFFECTED AREA FOUR TIMES DAILY AS NEEDED FOR PAIN   fexofenadine  (ALLEGRA  ALLERGY) 180 MG tablet Take 1 tablet (180 mg total) by mouth daily.   fluticasone  (FLONASE ) 50 MCG/ACT nasal spray Place 2 sprays into both nostrils daily.   ibuprofen (ADVIL) 600 MG tablet Take 1 tablet (600 mg total) by mouth every 8 (eight) hours as needed for moderate pain (pain score 4-6).   montelukast  (SINGULAIR ) 10 MG tablet TAKE 1 TABLET BY MOUTH EVERYDAY AT BEDTIME   nicotine  (NICODERM CQ  - DOSED IN MG/24 HOURS) 21 mg/24hr patch RX #1 Weeks 1-4: 21 mg x 1 patch daily. Wear for 24 hours. If you have sleep disturbances, remove at bedtime.   nystatin  (MYCOSTATIN ) 100000 UNIT/ML suspension Take 5 mLs (500,000 Units total) by mouth 4 (four) times daily. Swish and swallow   ondansetron  (ZOFRAN -ODT) 4 MG disintegrating tablet Take 1-2 tablets (4-8 mg total) by mouth every 8 (eight) hours as needed.  Semaglutide , 1 MG/DOSE, 4 MG/3ML SOPN Inject 1 mg as directed once a week.   tiZANidine  (ZANAFLEX ) 4 MG capsule tizanidine  4 mg capsule   triamcinolone  (KENALOG ) 0.1 % paste Use as directed 1 Application in the mouth or throat 2 (two) times daily.   zolpidem  (AMBIEN ) 10 MG tablet Take 1 tablet (10 mg total) by mouth at bedtime as needed. for sleep   benzonatate  (TESSALON ) 100 MG capsule Take 1-2 capsules (100-200 mg total) by mouth 3 (three) times daily as needed. (Patient not taking: Reported on 05/22/2024)   Cholecalciferol 1.25 MG (50000 UT) capsule  Take 1 capsule every week by oral route. (Patient not taking: Reported on 05/22/2024)   metFORMIN  (GLUCOPHAGE ) 500 MG tablet Take 2 tablets by mouth 2 (two) times daily with a meal. (Patient not taking: Reported on 05/22/2024)   metoCLOPramide  (REGLAN ) 5 MG tablet Take 1 tablet (5 mg total) by mouth every 8 (eight) hours as needed for nausea.   phentermine  (ADIPEX-P ) 37.5 MG tablet Take 1 tablet (37.5 mg total) by mouth daily before breakfast.   predniSONE  (STERAPRED UNI-PAK 21 TAB) 10 MG (21) TBPK tablet Take following package directions. (Patient not taking: Reported on 05/22/2024)   No facility-administered encounter medications on file as of 05/22/2024.    Allergies as of 05/22/2024 - Review Complete 05/08/2024  Allergen Reaction Noted   Chantix  [varenicline ] Nausea Only 05/08/2024   Codeine Nausea Only     Past Medical History:  Diagnosis Date   Allergy    Anxiety    Asthma    Depression    Diverticulosis 03/2016   Mild, noted on colonoscopy   High cholesterol    History of kidney stones    Lichen sclerosus    Anus   Low blood pressure reading    OA (osteoarthritis)    OSA on CPAP    Panlobular emphysema (HCC) 05/08/2024   Perianal cyst    Personal history of colonic polyps 04/06/2011   PONV (postoperative nausea and vomiting)    after c section, did well after knee replacement   PPD positive    Age 63 >> reports taking medicine for one year   Pre-diabetes    per pt report   Seasonal allergies    Sleep apnea    Thyroid  nodule 02/04/2024   Type 2 diabetes mellitus without complication, without long-term current use of insulin (HCC) 05/27/2023    Past Surgical History:  Procedure Laterality Date   ARTHROSCOPIC KNEE Left    CESAREAN SECTION     COLONOSCOPY W/ POLYPECTOMY  04/11/2016   ENDOMETRIAL ABLATION     GANGLION CYST EXCISION Right    HEMORROIDECTOMY     JOINT REPLACEMENT  both knees 2020   lichen sclerosus lesion excision     Anus   TOTAL KNEE ARTHROPLASTY Left  03/03/2019   Procedure: TOTAL KNEE ARTHROPLASTY;  Surgeon: Dayne Even, MD;  Location: [facility];  Service: Orthopedics;  Laterality: Left;   TOTAL KNEE ARTHROPLASTY Right 09/15/2019   Procedure: RIGHT TOTAL KNEE ARTHROPLASTY;  Surgeon: Dayne Even, MD;  Location: [facility];  Service: Orthopedics;  Laterality: Right;   TUBAL LIGATION      Family History  Problem Relation Age of Onset   Colon polyps Mother    Diabetes Mother    Hypertension Mother    Heart disease Mother    Hyperlipidemia Mother    Arthritis Mother    Kidney disease Mother    Stroke Mother    Lung cancer Father  Varicose Veins Sister    Alcohol abuse Brother    Hepatitis Brother        hep c   Breast cancer Maternal Grandmother        breast   Colon cancer Neg Hx    Esophageal cancer Neg Hx    Rectal cancer Neg Hx    Stomach cancer Neg Hx     Social History   Socioeconomic History   Marital status: Married    Spouse name: Not on file   Number of children: Not on file   Years of education: Not on file   Highest education level: Associate degree: academic program  Occupational History   Occupation: internet support  Tobacco Use   Smoking status: Some Days    Current packs/day: 0.00    Average packs/day: 0.5 packs/day for 30.0 years (15.0 ttl pk-yrs)    Types: Cigarettes    Start date: 07/30/1989    Last attempt to quit: 07/31/2019    Years since quitting: 4.8    Passive exposure: Never   Smokeless tobacco: Never   Tobacco comments:    1 pack per week on and off  Vaping Use   Vaping status: Never Used  Substance and Sexual Activity   Alcohol use: Not Currently    Comment: Maybe once a month   Drug use: No   Sexual activity: Yes    Partners: Male    Birth control/protection: Post-menopausal, Surgical  Other Topics Concern   Not on file  Social History Narrative   Exercise-- daily for 1 hour   Social Drivers of Health   Financial Resource Strain: High Risk (01/30/2024)   Overall  Financial Resource Strain (CARDIA)    Difficulty of Paying Living Expenses: Very hard  Food Insecurity: Food Insecurity Present (01/30/2024)   Hunger Vital Sign    Worried About Running Out of Food in the Last Year: Sometimes true    Ran Out of Food in the Last Year: Sometimes true  Transportation Needs: No Transportation Needs (01/30/2024)   PRAPARE - Administrator, Civil Service (Medical): No    Lack of Transportation (Non-Medical): No  Physical Activity: Insufficiently Active (01/30/2024)   Exercise Vital Sign    Days of Exercise per Week: 2 days    Minutes of Exercise per Session: 30 min  Stress: Stress Concern Present (01/30/2024)   [employer] of Occupational Health - Occupational Stress Questionnaire    Feeling of Stress : To some extent  Social Connections: Moderately Integrated (01/30/2024)   Social Connection and Isolation Panel [NHANES]    Frequency of Communication with Friends and Family: More than three times a week    Frequency of Social Gatherings with Friends and Family: More than three times a week    Attends Religious Services: Never    Database administrator or Organizations: Yes    Attends Engineer, structural: More than 4 times per year    Marital Status: Married  Catering manager Violence: Not on file    Review of Systems  Respiratory:  Positive for apnea and shortness of breath.   Psychiatric/Behavioral:  Positive for sleep disturbance.     Vitals:   05/22/24 0852  BP: 105/69  Pulse: 64  SpO2: 96%     Physical Exam Constitutional:      Appearance: She is obese.  HENT:     Head: Normocephalic.     Mouth/Throat:     Mouth: Mucous membranes are moist.  Eyes:  General: No scleral icterus. Cardiovascular:     Rate and Rhythm: Normal rate and regular rhythm.     Heart sounds: No murmur heard.    No friction rub.  Pulmonary:     Effort: No respiratory distress.     Breath sounds: No stridor. No wheezing or rhonchi.   Musculoskeletal:     Cervical back: No rigidity or tenderness.  Neurological:     Mental Status: She is alert.  Psychiatric:        Mood and Affect: Mood normal.      Data Reviewed: Previous sleep study reviewed showing severe obstructive sleep apnea with severe oxygen  desaturations Patient was titrated to CPAP of 10  Previous records reviewed showing good compliance  CT scan of the chest reviewed with the patient February 2024 showing a left perifissural nodule 3.5 mm, there is a nodule in the right midlung as well  Spirometry in March 2020 did reveal mild obstructive disease  Assessment:  Patient with obstructive sleep apnea Severe obstructive sleep apnea, not currently being treated - Having some recurrence of symptoms - Nonrestorative sleep -Insomnia  Insomnia - Benefiting from continuing Ambien   Reformed smoker   Chronic nasal congestion and stuffiness -On Singulair , Xyzal , Flonase , Astelin  -She feels she may benefit from ENT evaluation and a referral will be made  Class I obesity -Continue weight loss efforts  Low-dose cancer screening CT showing lung nodules  Obstructive lung disease with emphysema on CT scan chest  Plan/Recommendations:  Schedule for in-lab sleep study  Risk of untreated sleep disordered breathing discussed with the patient  Congratulated about quitting smoking  Schedule for pulmonary function test  Use albuterol  as needed  Follow-up in about 3 to 4 months   Myer Artis MD [facility] 05/22/2024, 9:14 AM  CC: Catheryn Cluck, MD

## 2024-05-28 ENCOUNTER — Ambulatory Visit: Admitting: Family Medicine

## 2024-05-28 VITALS — BP 108/66 | HR 68 | Temp 97.0°F | Ht 61.0 in | Wt 169.8 lb

## 2024-05-28 DIAGNOSIS — R6889 Other general symptoms and signs: Secondary | ICD-10-CM

## 2024-05-28 DIAGNOSIS — M5442 Lumbago with sciatica, left side: Secondary | ICD-10-CM

## 2024-05-28 DIAGNOSIS — G8929 Other chronic pain: Secondary | ICD-10-CM

## 2024-05-28 DIAGNOSIS — W57XXXA Bitten or stung by nonvenomous insect and other nonvenomous arthropods, initial encounter: Secondary | ICD-10-CM | POA: Diagnosis not present

## 2024-05-28 DIAGNOSIS — S80262A Insect bite (nonvenomous), left knee, initial encounter: Secondary | ICD-10-CM | POA: Diagnosis not present

## 2024-05-28 DIAGNOSIS — L739 Follicular disorder, unspecified: Secondary | ICD-10-CM

## 2024-05-28 MED ORDER — ONDANSETRON 4 MG PO TBDP: 4.0000 mg | ORAL_TABLET | Freq: Three times a day (TID) | ORAL | 0 refills | Status: DC | PRN

## 2024-05-28 MED ORDER — MUPIROCIN 2 % EX OINT
1.0000 | TOPICAL_OINTMENT | Freq: Two times a day (BID) | CUTANEOUS | 0 refills | Status: AC
Start: 2024-05-28 — End: 2024-06-11

## 2024-05-28 MED ORDER — CHLORHEXIDINE GLUCONATE 4 % EX SOLN: CUTANEOUS | 0 refills | Status: AC

## 2024-05-28 NOTE — Progress Notes (Signed)
 Assessment & Plan   Assessment/Plan:   Assessment and Plan Boil under left arm The boil under the left arm has significantly reduced in size after oral doxycycline . A small, non-painful, palpable area remains, with no drainage since starting antibiotics. The boil has healed well and does not require lancing. Topical treatments are recommended to address any remaining infection and prevent recurrence. Oral doxycycline  increases the risk of sunburn and gastrointestinal issues, so topical treatment is preferred. - Prescribe mupirocin 2% ointment to apply twice daily for two weeks. - Recommend chlorhexidine  rinse to clean the area before applying the ointment.  Tick bite A tick bite on the back of the leg presents as a small, red area without infection. Monitoring for symptoms such as fever, rashes, joint pain, or bullseye rash is advised, as these indicate Lyme disease or other tick-borne illnesses. She reports no significant itching or symptoms currently. - Advise monitoring for symptoms such as fever, rashes, joint pain, or bullseye rash. - Recommend applying 1% hydrocortisone or diphenhydramine  cream if itching occurs.  Nausea and vomiting She experienced an episode of vomiting on Sunday night, coinciding with her granddaughter's similar symptoms. The cause is unclear, possibly a viral illness or transient issue. She inquires about ondansetron  for nausea management. - Consider prescribing ondansetron  if nausea persists or recurs.  Chronic back and nerve pain Chronic back and nerve pain, possibly related to sciatica and pinched nerves, requires further evaluation. She seeks a thorough assessment rather than immediate surgical intervention. A referral to neurosurgery is appropriate for a detailed evaluation of the spine and nerves. - Refer to neurosurgery for a comprehensive evaluation of the back and nerves.    Medications Discontinued During This Encounter  Medication Reason   metFORMIN   (GLUCOPHAGE ) 500 MG tablet Discontinued by provider   ondansetron  (ZOFRAN -ODT) 4 MG disintegrating tablet Reorder    No follow-ups on file.        Subjective:   Encounter date: 05/28/2024  Robin Small is a 63 y.o. female who has Dyslipidemia; ANXIETY DEPRESSION; Mild intermittent asthma; LUMBAR SPRAIN AND STRAIN; External hemorrhoids; Muscle ache; Dyspnea; Sleep apnea; Sinusitis, acute; History of colonic polyps; Seasonal and perennial allergic rhinitis; Primary osteoarthritis of left knee; Primary osteoarthritis of right knee; Lichen sclerosus; S/P hemorrhoidectomy; TMJ (temporomandibular joint syndrome); Seasonal allergic rhinitis due to pollen; Class 1 obesity with serious comorbidity and body mass index (BMI) of 33.0 to 33.9 in adult; Sleep disturbance; Hemorrhoids, internal; Chronic pain of both knees; Tobacco abuse; Hyperlipidemia; Post-operative nausea and vomiting; Type 2 diabetes mellitus without complication, without long-term current use of insulin (HCC); Chronic pain of left ankle; Chronic midline low back pain with left-sided sciatica; Extrinsic asthma; Type 2 diabetes mellitus with neurological complications (HCC); Thyroid  nodule; Aortic atherosclerosis (HCC); Multinodular goiter; Panlobular emphysema (HCC); and Folliculitis of axilla on their problem list..   She  has a past medical history of Allergy, Anxiety, Asthma, Depression, Diverticulosis (03/2016), High cholesterol, History of kidney stones, Lichen sclerosus, Low blood pressure reading, OA (osteoarthritis), OSA on CPAP, Panlobular emphysema (HCC) (05/08/2024), Perianal cyst, Personal history of colonic polyps (04/06/2011), PONV (postoperative nausea and vomiting), PPD positive, Pre-diabetes, Seasonal allergies, Sleep apnea, Thyroid  nodule (02/04/2024), and Type 2 diabetes mellitus without complication, without long-term current use of insulin (HCC) (05/27/2023)..   She presents with chief complaint of Follow-up (Follow up for  boil under left arm ) and Tick Removal (Not sure if tick is completely removed from back left knee ) .   History of Present Illness Robin  ONYINYECHI Small is a 63 year old female who presents with a persistent boil under her left arm.  She has had a boil under her left arm since January. It significantly reduced in size after a course of doxycycline  but remains slightly present. The boil is located near her rib, is not currently painful, and has not drained recently. She has not used any topical treatments yet. She has no history of boils, although her daughter has experienced them.  She was bitten by a tick on her leg the previous day, which was removed. The site is tiny, red, and not significantly itchy. No fever, rashes, or worsening joint pain.  She experienced a recent episode of vomiting on a Sunday night, coinciding with her granddaughter's similar symptoms, suggesting a possible shared cause. She is uncertain if this was related to a stomach issue or a side effect of medication.  She has a history of orthopedic issues, including foot and leg pain, nerve pain, and sciatica. She has undergone x-rays and injections in the past but has not had a comprehensive workup for her back and nerves. She wants further evaluation to identify potential causes of her symptoms.  ROS  Past Surgical History:  Procedure Laterality Date   ARTHROSCOPIC KNEE Left    CESAREAN SECTION     COLONOSCOPY W/ POLYPECTOMY  04/11/2016   ENDOMETRIAL ABLATION     GANGLION CYST EXCISION Right    HEMORROIDECTOMY     JOINT REPLACEMENT  both knees 2020   lichen sclerosus lesion excision     Anus   TOTAL KNEE ARTHROPLASTY Left 03/03/2019   Procedure: TOTAL KNEE ARTHROPLASTY;  Surgeon: Dayne Even, MD;  Location: [facility];  Service: Orthopedics;  Laterality: Left;   TOTAL KNEE ARTHROPLASTY Right 09/15/2019   Procedure: RIGHT TOTAL KNEE ARTHROPLASTY;  Surgeon: Dayne Even, MD;  Location: [facility];  Service: Orthopedics;   Laterality: Right;   TUBAL LIGATION      Outpatient Medications Prior to Visit  Medication Sig Dispense Refill   acetaminophen  (TYLENOL ) 500 MG tablet Take 1,000 mg by mouth every 6 (six) hours as needed for moderate pain.     albuterol  (VENTOLIN  HFA) 108 (90 Base) MCG/ACT inhaler Inhale 1-2 puffs into the lungs every 6 (six) hours as needed. 8 g 0   atorvastatin  (LIPITOR) 20 MG tablet TAKE 1 TABLET BY MOUTH EVERY DAY 90 tablet 3   Azelastine  HCl 137 MCG/SPRAY SOLN SPRAY ONCE IN EACH NOSTRIL TWICE DAILY AS DIRECTED 30 mL 11   benzonatate  (TESSALON ) 100 MG capsule Take 1-2 capsules (100-200 mg total) by mouth 3 (three) times daily as needed. 30 capsule 0   brompheniramine-pseudoephedrine -DM 30-2-10 MG/5ML syrup Take 5 mLs by mouth 4 (four) times daily as needed. 120 mL 0   Calcium  Carbonate-Vitamin D (CALCIUM -D) 600-400 MG-UNIT TABS Take 2 tablets by mouth at bedtime.     Cholecalciferol 1.25 MG (50000 UT) capsule Take 1 capsule every week by oral route.     clobetasol cream (TEMOVATE) 0.05 % Apply 1 application topically 2 (two) times daily as needed (skin irritation).     diclofenac  Sodium (VOLTAREN ) 1 % GEL APPLY 4 GRAMS TOPICALLY TO THE AFFECTED AREA FOUR TIMES DAILY AS NEEDED FOR PAIN 300 g 0   fexofenadine  (ALLEGRA  ALLERGY) 180 MG tablet Take 1 tablet (180 mg total) by mouth daily. 90 tablet 0   fluticasone  (FLONASE ) 50 MCG/ACT nasal spray Place 2 sprays into both nostrils daily. 16 g 0   ibuprofen (ADVIL) 600 MG tablet  Take 1 tablet (600 mg total) by mouth every 8 (eight) hours as needed for moderate pain (pain score 4-6). 60 tablet 0   montelukast  (SINGULAIR ) 10 MG tablet TAKE 1 TABLET BY MOUTH EVERYDAY AT BEDTIME 90 tablet 2   nicotine  (NICODERM CQ  - DOSED IN MG/24 HOURS) 21 mg/24hr patch RX #1 Weeks 1-4: 21 mg x 1 patch daily. Wear for 24 hours. If you have sleep disturbances, remove at bedtime. 28 patch 0   nystatin  (MYCOSTATIN ) 100000 UNIT/ML suspension Take 5 mLs (500,000 Units  total) by mouth 4 (four) times daily. Swish and swallow 200 mL 0   phentermine  (ADIPEX-P ) 37.5 MG tablet Take 1 tablet (37.5 mg total) by mouth daily before breakfast. 90 tablet 1   Semaglutide , 1 MG/DOSE, 4 MG/3ML SOPN Inject 1 mg as directed once a week. 9 mL 0   tiZANidine  (ZANAFLEX ) 4 MG capsule tizanidine  4 mg capsule     triamcinolone  (KENALOG ) 0.1 % paste Use as directed 1 Application in the mouth or throat 2 (two) times daily. 5 g 12   zolpidem  (AMBIEN ) 10 MG tablet Take 1 tablet (10 mg total) by mouth at bedtime as needed. for sleep 90 tablet 1   ondansetron  (ZOFRAN -ODT) 4 MG disintegrating tablet Take 1-2 tablets (4-8 mg total) by mouth every 8 (eight) hours as needed. 20 tablet 0   metoCLOPramide  (REGLAN ) 5 MG tablet Take 1 tablet (5 mg total) by mouth every 8 (eight) hours as needed for nausea. (Patient not taking: Reported on 05/28/2024) 30 tablet 0   predniSONE  (STERAPRED UNI-PAK 21 TAB) 10 MG (21) TBPK tablet Take following package directions. (Patient not taking: Reported on 05/28/2024) 21 tablet 0   metFORMIN  (GLUCOPHAGE ) 500 MG tablet Take 2 tablets by mouth 2 (two) times daily with a meal. (Patient not taking: Reported on 05/22/2024)     No facility-administered medications prior to visit.    Family History  Problem Relation Age of Onset   Colon polyps Mother    Diabetes Mother    Hypertension Mother    Heart disease Mother    Hyperlipidemia Mother    Arthritis Mother    Kidney disease Mother    Stroke Mother    Lung cancer Father    Varicose Veins Sister    Alcohol abuse Brother    Hepatitis Brother        hep c   Breast cancer Maternal Grandmother        breast   Colon cancer Neg Hx    Esophageal cancer Neg Hx    Rectal cancer Neg Hx    Stomach cancer Neg Hx     Social History   Socioeconomic History   Marital status: Married    Spouse name: Not on file   Number of children: Not on file   Years of education: Not on file   Highest education level: Associate  degree: academic program  Occupational History   Occupation: internet support  Tobacco Use   Smoking status: Some Days    Current packs/day: 0.00    Average packs/day: 0.5 packs/day for 30.0 years (15.0 ttl pk-yrs)    Types: Cigarettes    Start date: 07/30/1989    Last attempt to quit: 07/31/2019    Years since quitting: 4.8    Passive exposure: Never   Smokeless tobacco: Never   Tobacco comments:    1 pack per week on and off  Vaping Use   Vaping status: Never Used  Substance and Sexual Activity  Alcohol use: Not Currently    Comment: Maybe once a month   Drug use: No   Sexual activity: Yes    Partners: Male    Birth control/protection: Post-menopausal, Surgical  Other Topics Concern   Not on file  Social History Narrative   Exercise-- daily for 1 hour   Social Drivers of Health   Financial Resource Strain: High Risk (05/28/2024)   Overall Financial Resource Strain (CARDIA)    Difficulty of Paying Living Expenses: Hard  Food Insecurity: Food Insecurity Present (05/28/2024)   Hunger Vital Sign    Worried About Running Out of Food in the Last Year: Sometimes true    Ran Out of Food in the Last Year: Never true  Transportation Needs: Unmet Transportation Needs (05/28/2024)   PRAPARE - Transportation    Lack of Transportation (Medical): Yes    Lack of Transportation (Non-Medical): Yes  Physical Activity: Sufficiently Active (05/28/2024)   Exercise Vital Sign    Days of Exercise per Week: 3 days    Minutes of Exercise per Session: 60 min  Stress: No Stress Concern Present (05/28/2024)   [employer] of Occupational Health - Occupational Stress Questionnaire    Feeling of Stress: Only a little  Social Connections: Moderately Integrated (05/28/2024)   Social Connection and Isolation Panel    Frequency of Communication with Friends and Family: Three times a week    Frequency of Social Gatherings with Friends and Family: More than three times a week    Attends Religious  Services: Never    Database administrator or Organizations: Yes    Attends Banker Meetings: Never    Marital Status: Married  Catering manager Violence: Not on file                                                                                                  Objective:  Physical Exam: BP 108/66 (BP Location: Left Arm, Patient Position: Sitting, Cuff Size: Large)   Pulse 68   Temp (!) 97 F (36.1 C) (Temporal)   Ht 5' 1 (1.549 m)   Wt 169 lb 12.8 oz (77 kg)   SpO2 98%   BMI 32.08 kg/m     GENERAL: Alert, cooperative, well developed, no acute distress. HEENT: Normocephalic, normal oropharynx, moist mucous membranes. CHEST: Clear to auscultation bilaterally, no wheezes, rhonchi, or crackles. CARDIOVASCULAR: Normal heart rate and rhythm, S1 and S2 normal without murmurs. ABDOMEN: Soft, non-tender, non-distended, without organomegaly, normal bowel sounds. EXTREMITIES: No cyanosis or edema. NEUROLOGICAL: Cranial nerves grossly intact, moves all extremities without gross motor or sensory deficit. SKIN: Skin under left arm healed well, no drainage, not bumpy. Tick bite on back of leg, tiny, red, no drainage. No palpable mass under left arm.  Physical Exam  No results found.  Recent Results (from the past 2160 hours)  POCT glycosylated hemoglobin (Hb A1C)     Status: Abnormal   Collection Time: 05/08/24  8:15 AM  Result Value Ref Range   Hemoglobin A1C 5.8 (A) 4.0 - 5.6 %   HbA1c POC (<> result, manual entry)  HbA1c, POC (prediabetic range)     HbA1c, POC (controlled diabetic range)          Carnell Christian, MD, MS

## 2024-05-30 ENCOUNTER — Other Ambulatory Visit: Payer: Self-pay | Admitting: Family Medicine

## 2024-05-30 DIAGNOSIS — J301 Allergic rhinitis due to pollen: Secondary | ICD-10-CM

## 2024-06-01 ENCOUNTER — Encounter: Payer: Self-pay | Admitting: Cardiology

## 2024-06-01 ENCOUNTER — Ambulatory Visit: Attending: Cardiology | Admitting: Cardiology

## 2024-06-01 VITALS — BP 104/68 | HR 58 | Ht 61.0 in | Wt 170.2 lb

## 2024-06-01 DIAGNOSIS — J431 Panlobular emphysema: Secondary | ICD-10-CM | POA: Insufficient documentation

## 2024-06-01 DIAGNOSIS — E785 Hyperlipidemia, unspecified: Secondary | ICD-10-CM | POA: Diagnosis not present

## 2024-06-01 DIAGNOSIS — G4733 Obstructive sleep apnea (adult) (pediatric): Secondary | ICD-10-CM | POA: Diagnosis not present

## 2024-06-01 DIAGNOSIS — Z720 Tobacco use: Secondary | ICD-10-CM | POA: Insufficient documentation

## 2024-06-01 DIAGNOSIS — E1169 Type 2 diabetes mellitus with other specified complication: Secondary | ICD-10-CM | POA: Diagnosis not present

## 2024-06-01 DIAGNOSIS — I2584 Coronary atherosclerosis due to calcified coronary lesion: Secondary | ICD-10-CM | POA: Diagnosis not present

## 2024-06-01 DIAGNOSIS — I2510 Atherosclerotic heart disease of native coronary artery without angina pectoris: Secondary | ICD-10-CM | POA: Insufficient documentation

## 2024-06-01 DIAGNOSIS — E782 Mixed hyperlipidemia: Secondary | ICD-10-CM

## 2024-06-01 NOTE — Progress Notes (Signed)
 Cardiology Office Note:  .   Date:  06/04/2024  ID:  Robin Small, DOB [birth year], MRN [id number] PCP: Catheryn Cluck, MD  [facility] Providers Cardiologist:  Randene Bustard, MD     Chief Complaint  Patient presents with   New Patient (Initial Visit)    Coronary Calcium  noted on CT    Patient Profile: .     Robin Small is an obese 63 y.o. female smoker with with a PMH notable for DM-2, HLD, OSA on CPAP and COPD who presents here for evaluation of coronary calcification on chest CT at the request of Merryl Abraham, MD/Aron Hildy Lowers, MD.  No prior cardiac evaluation    Robin Small was last seen on 05/08/2024 by Dr. Hildy Lowers for routine follow-up.  Noted improve glycemic control with increased dose of Ozempic  and maybe 15 pound weight loss.  Noted aortic and coronary artery atherosclerosis on lung cancer screening chest CT and referred for cardiology evaluation, especially in light of family history of heart disease although she is not sure what the actual diseases..  Subjective  Discussed the use of AI scribe software for clinical note transcription with the patient, who gave verbal consent to proceed.  History of Present Illness History of Present Illness Robin Small is a 63 year old female who presents for evaluation of coronary artery calcium  buildup.  She underwent a CT scan as part of a routine lung cancer screening due to her history of smoking, which revealed calcium  buildup in her arteries. She is asymptomatic for chest tightness, pressure, or pain during physical activities such as walking or grocery shopping. No shortness of breath when lying flat or waking up short of breath, although she uses an inclined bed for comfort. Her blood pressure is typically low, around 100/60 mmHg, which she considers normal.  She has a history of diabetes and is currently taking Ozempic . She previously tried metformin  but did not tolerate it well. Her cholesterol levels are  managed with atorvastatin , and her LDL cholesterol is reported to be 67 mg/dL. She also takes medication for seasonal allergies, including albuterol  as needed, Flonase  as needed, and Singulair  daily. She quit smoking in March and is no longer using nicotine  replacement therapy.  She has a history of sleep apnea and previously used a CPAP machine, but discontinued its use due to discomfort. A sleep study is scheduled for September. She reports experiencing some respiratory issues this year, which she attributes to allergies. A CT scan indicated possible mild emphysema.  Her family history includes her mother having a sudden heart problem at the age of 40, which involved a heart attack and four leaking valves. She is concerned about her own heart health, particularly in light of her mother's history.  Cardiovascular ROS: positive for - dyspnea on exertion and this is really only with significant exertion but not routine activity.  He sleeps elevated in her bed for comfort not for orthopnea. negative for - chest pain, edema, irregular heartbeat, orthopnea, palpitations, paroxysmal nocturnal dyspnea, rapid heart rate, shortness of breath, or lightheadedness dizziness or wooziness, syncope/near syncope or TIA/amaurosis fugax, Claudication  ROS:  Review of Systems - Negative except symptoms noted above    Objective   Social History - Quit smoking in March  Family History - Mother had a heart attack at an early age  Cardiovascular/Pulmonary medications - Atorvastatin  20 mg daily - Albuterol  (as needed); - Flonase  (as needed) - Singulair  10 mg, Allegra  180 mg (daily) -  Ozempic  4 mg/ML-1 mg weekly  Studies Reviewed: Aaron Aas   EKG Interpretation Date/Time:  Monday June 01 2024 11:20:12 EDT Ventricular Rate:  58 PR Interval:  134 QRS Duration:  82 QT Interval:  406 QTC Calculation: 398 R Axis:   82  Text Interpretation: Sinus bradycardia When compared with ECG of 15-Sep-2019 10:47, No significant  change was found Confirmed by Randene Bustard ([id number]) on 06/01/2024 11:59:52 AM    Lab Results  Component Value Date   CHOL 145 02/07/2024   HDL 45.80 02/07/2024   LDLCALC 67 02/07/2024   LDLDIRECT 102.0 12/29/2021   TRIG 158.0 (H) 02/07/2024   CHOLHDL 3 02/07/2024   Lab Results  Component Value Date   NA 141 02/07/2024   CL 104 02/07/2024   K 4.4 02/07/2024   CO2 31 02/07/2024   BUN 10 02/07/2024   CREATININE 0.58 02/07/2024   GFR 97.90 02/07/2024   CALCIUM  9.0 02/07/2024   ALBUMIN 4.3 02/07/2024   GLUCOSE 98 02/07/2024   Lab Results  Component Value Date   HGBA1C 5.8 (A) 05/08/2024   Results Pertinent LABS Triglycerides: 158 mg/dL LDL: 67 mg/dL  RADIOLOGY Chest CT: Calcification in aorta, subclavian arteries, carotid arteries, and coronary arteries; emphysema (02/26/2024)  DIAGNOSTIC EKG: Normal  Risk Assessment/Calculations:            Physical Exam:   VS:  BP 104/68   Pulse (!) 58   Ht 5' 1 (1.549 m)   Wt 170 lb 3.2 oz (77.2 kg)   SpO2 96%   BMI 32.16 kg/m    Wt Readings from Last 3 Encounters:  06/01/24 170 lb 3.2 oz (77.2 kg)  05/28/24 169 lb 12.8 oz (77 kg)  05/22/24 169 lb (76.7 kg)    GEN: Well nourished, well groomed; in no acute distress; mildly obese; otherwise the 15. NECK: No JVD; No carotid bruits CARDIAC: Normal S1, S2; RRR, no murmurs, rubs, gallops RESPIRATORY:  Clear to auscultation without rales, wheezing or rhonchi ; nonlabored, good air movement. ABDOMEN: Soft, non-tender, non-distended EXTREMITIES:  No edema; No deformity     ASSESSMENT AND PLAN: .    Problem List Items Addressed This Visit       Cardiology Problems   Coronary artery calcification seen on CAT scan - Primary (Chronic)   Calcium  on CT indicates cholesterol plaque in major arteries. Asymptomatic. LDL at 67 shows atorvastatin  efficacy. Triglycerides at 158 likely due to diabetes. Coronary calcium  score will guide management. Discussed atherosclerosis marker and  cholesterol management to prevent myocardial infarction. High score may require aggressive treatment. - Order coronary calcium  score CT scan. - Discuss results and management post-CT scan. - Consider aspirin  therapy and increased statin dosage if score is high. - Smoking cessation counseling      Relevant Orders   CT CARDIAC SCORING (SELF PAY ONLY)   Hyperlipidemia associated with type 2 diabetes mellitus (HCC) (Chronic)   Most recent lipid panel shows an LDL well within target range of 67 on atorvastatin  20 mg daily. Currently her diabetes is only being managed with Ozempic . Metformin  not tolerated. Triglycerides at 158 consistent with diabetes, but A1c is notably only 5.8.  - Continue atorvastatin  20 mg daily along with Ozempic  for continued weight loss and glycemic control.       Relevant Orders   CT CARDIAC SCORING (SELF PAY ONLY)     Other   Panlobular emphysema (HCC) (Chronic)   Mild emphysema noted on CT scan.  Defer to pulmonary medicine and PCP. Smoking  cessation counseling      Sleep apnea   Previous CPAP use. Not using due to machine issues. Sleep study scheduled for September.      Tobacco abuse (Chronic)   History of Tobacco Use Quit smoking in March. No nicotine  replacement therapy. - Encourage continued abstinence.      Other Visit Diagnoses       Coronary atherosclerosis due to calcified coronary lesion       Relevant Orders   EKG 12-Lead (Completed)   CT CARDIAC SCORING (SELF PAY ONLY)            Follow-Up: Return in about 3 months (around 09/01/2024) for [employer].     Signed, Arleen Lacer, MD, MS Randene Bustard, M.D., M.S. Interventional Chartered certified accountant  Pager # [phone]

## 2024-06-01 NOTE — Patient Instructions (Signed)
 Medication Instructions:   NO CHANGES *If you need a refill on your cardiac medications before your next appointment, please call your pharmacy*   Lab Work: NOT NEEDED    Testing/Procedures: CT coronary calcium  score.   Test locations:  [facility]  [facility]  This is $99 out of pocket.   Coronary CalciumScan A coronary calcium  scan is an imaging test used to look for deposits of calcium  and other fatty materials (plaques) in the inner lining of the blood vessels of the heart (coronary arteries). These deposits of calcium  and plaques can partly clog and narrow the coronary arteries without producing any symptoms or warning signs. This puts a person at risk for a heart attack. This test can detect these deposits before symptoms develop. Tell a health care provider about: Any allergies you have. All medicines you are taking, including vitamins, herbs, eye drops, creams, and over-the-counter medicines. Any problems you or family members have had with anesthetic medicines. Any blood disorders you have. Any surgeries you have had. Any medical conditions you have. Whether you are pregnant or may be pregnant. What are the risks? Generally, this is a safe procedure. However, problems may occur, including: Harm to a pregnant woman and her unborn baby. This test involves the use of radiation. Radiation exposure can be dangerous to a pregnant woman and her unborn baby. If you are pregnant, you generally should not have this procedure done. Slight increase in the risk of cancer. This is because of the radiation involved in the test. What happens before the procedure? No preparation is needed for this procedure. What happens during the procedure? You will undress and remove any jewelry around your neck or chest. You will put on a hospital gown. Sticky electrodes will be placed on your chest. The  electrodes will be connected to an electrocardiogram (ECG) machine to record a tracing of the electrical activity of your heart. A CT scanner will take pictures of your heart. During this time, you will be asked to lie still and hold your breath for 2-3 seconds while a picture of your heart is being taken. The procedure may vary among health care providers and hospitals. What happens after the procedure? You can get dressed. You can return to your normal activities. It is up to you to get the results of your test. Ask your health care provider, or the department that is doing the test, when your results will be ready. Summary A coronary calcium  scan is an imaging test used to look for deposits of calcium  and other fatty materials (plaques) in the inner lining of the blood vessels of the heart (coronary arteries). Generally, this is a safe procedure. Tell your health care provider if you are pregnant or may be pregnant. No preparation is needed for this procedure. A CT scanner will take pictures of your heart. You can return to your normal activities after the scan is done. This information is not intended to replace advice given to you by your health care provider. Make sure you discuss any questions you have with your health care provider. Document Released: 05/31/2008 Document Revised: 10/22/2016 Document Reviewed: 10/22/2016 Elsevier Interactive Patient Education  2017 [employer].    Follow-Up: At [facility], you and your health needs are our priority.  As part of our continuing mission to provide you with exceptional heart care, we have created designated Provider Care Teams.  These Care Teams include  your primary Cardiologist (physician) and Advanced Practice Providers (APPs -  Physician Assistants and Nurse Practitioners) who all work together to provide you with the care you need, when you need it.     Your next appointment:   3 month(s)  The format for your next appointment:    In Person  Provider:   Dr Addie Holstein

## 2024-06-04 ENCOUNTER — Encounter: Payer: Self-pay | Admitting: Cardiology

## 2024-06-04 NOTE — Assessment & Plan Note (Addendum)
 Most recent lipid panel shows an LDL well within target range of 67 on atorvastatin  20 mg daily. Currently her diabetes is only being managed with Ozempic . Metformin  not tolerated. Triglycerides at 158 consistent with diabetes, but A1c is notably only 5.8.  - Continue atorvastatin  20 mg daily along with Ozempic  for continued weight loss and glycemic control.

## 2024-06-04 NOTE — Assessment & Plan Note (Signed)
 Previous CPAP use. Not using due to machine issues. Sleep study scheduled for September.

## 2024-06-04 NOTE — Assessment & Plan Note (Signed)
 Mild emphysema noted on CT scan.  Defer to pulmonary medicine and PCP. Smoking cessation counseling

## 2024-06-04 NOTE — Assessment & Plan Note (Signed)
 History of Tobacco Use Quit smoking in March. No nicotine  replacement therapy. - Encourage continued abstinence.

## 2024-06-04 NOTE — Assessment & Plan Note (Addendum)
 Calcium  on CT indicates cholesterol plaque in major arteries. Asymptomatic. LDL at 67 shows atorvastatin  efficacy. Triglycerides at 158 likely due to diabetes. Coronary calcium  score will guide management. Discussed atherosclerosis marker and cholesterol management to prevent myocardial infarction. High score may require aggressive treatment. - Order coronary calcium  score CT scan. - Discuss results and management post-CT scan. - Consider aspirin  therapy and increased statin dosage if score is high. - Smoking cessation counseling

## 2024-06-05 ENCOUNTER — Ambulatory Visit (HOSPITAL_BASED_OUTPATIENT_CLINIC_OR_DEPARTMENT_OTHER)
Admission: RE | Admit: 2024-06-05 | Discharge: 2024-06-05 | Disposition: A | Discharge status: Home / Self Care | Payer: Self-pay | Source: Ambulatory Visit | Attending: Cardiology | Admitting: Cardiology

## 2024-06-05 DIAGNOSIS — I2584 Coronary atherosclerosis due to calcified coronary lesion: Secondary | ICD-10-CM | POA: Insufficient documentation

## 2024-06-05 DIAGNOSIS — E1169 Type 2 diabetes mellitus with other specified complication: Secondary | ICD-10-CM | POA: Insufficient documentation

## 2024-06-05 DIAGNOSIS — I2510 Atherosclerotic heart disease of native coronary artery without angina pectoris: Secondary | ICD-10-CM | POA: Insufficient documentation

## 2024-06-05 DIAGNOSIS — E785 Hyperlipidemia, unspecified: Secondary | ICD-10-CM | POA: Insufficient documentation

## 2024-06-09 ENCOUNTER — Ambulatory Visit: Payer: Self-pay | Admitting: Cardiology

## 2024-06-11 ENCOUNTER — Encounter: Payer: Self-pay | Admitting: Podiatry

## 2024-06-11 ENCOUNTER — Ambulatory Visit (INDEPENDENT_AMBULATORY_CARE_PROVIDER_SITE_OTHER)

## 2024-06-11 ENCOUNTER — Ambulatory Visit (INDEPENDENT_AMBULATORY_CARE_PROVIDER_SITE_OTHER): Admitting: Podiatry

## 2024-06-11 ENCOUNTER — Other Ambulatory Visit: Payer: Self-pay | Admitting: Podiatry

## 2024-06-11 DIAGNOSIS — M19072 Primary osteoarthritis, left ankle and foot: Secondary | ICD-10-CM

## 2024-06-11 DIAGNOSIS — M778 Other enthesopathies, not elsewhere classified: Secondary | ICD-10-CM | POA: Diagnosis not present

## 2024-06-11 DIAGNOSIS — M5417 Radiculopathy, lumbosacral region: Secondary | ICD-10-CM

## 2024-06-11 MED ORDER — DICLOFENAC SODIUM 75 MG PO TBEC
75.0000 mg | DELAYED_RELEASE_TABLET | Freq: Two times a day (BID) | ORAL | 0 refills | Status: DC
Start: 2024-06-11 — End: 2024-06-16

## 2024-06-11 MED ORDER — TRIAMCINOLONE ACETONIDE 10 MG/ML IJ SUSP: 10.0000 mg | Freq: Once | INTRAMUSCULAR | Status: DC

## 2024-06-12 ENCOUNTER — Ambulatory Visit: Admitting: Podiatry

## 2024-06-12 ENCOUNTER — Ambulatory Visit: Admitting: Cardiovascular Disease

## 2024-06-14 NOTE — Progress Notes (Signed)
 Chief Complaint  Patient presents with   Foot Pain    Patient states she has been having foot pain since 2023 on her lateral back side of her left foot. Patient states sometimes it is shooting pain that goes from the top of her foot to the back. Patient doesn't take medication for pain    HPI: 63 y.o. female presents today with longstanding complaint of left foot and ankle pain.  She does report that she saw an orthopedic doctor for this and was diagnosed with subtalar joint arthritis.  Pain has been ongoing, she has had injections previously which did provide intermittent relief.  She also reports shooting pain going up the ankle and leg as well.  Does report history of back pain.  Has history of osteoarthritis and prior knee replacement as well.  Past Medical History:  Diagnosis Date   Allergy    Anxiety    Asthma    Depression    Diverticulosis 03/2016   Mild, noted on colonoscopy   High cholesterol    History of kidney stones    Lichen sclerosus    Anus   Low blood pressure reading    OA (osteoarthritis)    OSA on CPAP    Panlobular emphysema (HCC) 05/08/2024   Perianal cyst    Personal history of colonic polyps 04/06/2011   PONV (postoperative nausea and vomiting)    after c section, did well after knee replacement   PPD positive    Age 57 >> reports taking medicine for one year   Pre-diabetes    per pt report   Seasonal allergies    Sleep apnea    Thyroid  nodule 02/04/2024   Type 2 diabetes mellitus without complication, without long-term current use of insulin (HCC) 05/27/2023    Past Surgical History:  Procedure Laterality Date   ARTHROSCOPIC KNEE Left    CESAREAN SECTION     COLONOSCOPY W/ POLYPECTOMY  04/11/2016   ENDOMETRIAL ABLATION     GANGLION CYST EXCISION Right    HEMORROIDECTOMY     JOINT REPLACEMENT  both knees 2020   lichen sclerosus lesion excision     Anus   TOTAL KNEE ARTHROPLASTY Left 03/03/2019   Procedure: TOTAL KNEE ARTHROPLASTY;   Surgeon: Sheril Coy, MD;  Location: [facility];  Service: Orthopedics;  Laterality: Left;   TOTAL KNEE ARTHROPLASTY Right 09/15/2019   Procedure: RIGHT TOTAL KNEE ARTHROPLASTY;  Surgeon: Sheril Coy, MD;  Location: [facility];  Service: Orthopedics;  Laterality: Right;   TUBAL LIGATION      Allergies  Allergen Reactions   Chantix  [Varenicline ] Nausea Only   Codeine Nausea Only    ROS    Physical Exam: There were no vitals filed for this visit.  General: The patient is alert and oriented x3 in no acute distress.  Dermatology: Skin is warm, dry and supple bilateral lower extremities. Interspaces are clear of maceration and debris.    Vascular: Palpable pedal pulses bilaterally. Capillary refill within normal limits. No erythema or calor.  Neurological: Light touch sensation grossly intact bilateral feet.   Musculoskeletal Exam: Left foot greater than right foot pes planus foot type.  There is tenderness on palpation of left sinus tarsi and of subtalar joint.  Decrease subtalar joint inversion eversion versus right side.  No significant pain with ankle joint range of motion.  Does ambulate with cane assistance.  Radiographic Exam: Left foot 3 views weightbearing 06/11/2024 Normal osseous mineralization.  Subtalar joint arthritis  appreciated.  Pes planus foot type noted.  Some mild dorsal TN spurring present with some arthritis appreciated to lesser 2nd and 3rd TMT J's.  Assessment/Plan of Care: 1. Arthritis of left subtalar joint   2. Capsulitis of left foot      Meds ordered this encounter  Medications   triamcinolone  acetonide (KENALOG ) 10 MG/ML injection 10 mg   diclofenac  (VOLTAREN ) 75 MG EC tablet    Sig: Take 1 tablet (75 mg total) by mouth 2 (two) times daily.    Dispense:  60 tablet    Refill:  0   None  Discussed clinical findings with patient today.  Radiographs reviewed with the patient.  Findings consistent with subtalar joint arthritis.  Discussed use of good  supportive stiff soled shoe.  Discussed that she may notice some relief with good quality insert.  We did dispense power steps today.  Discussed RICE therapy, course of oral NSAIDs, course of oral diclofenac  sent to patient's pharmacy.  Verbal consent obtained to administer corticosteroid injection to the left subtalar joint.  Betadine  skin prep.  Injected 0.5 cc of 0.5% Marcaine  plain mixed with 10 mg Kenalog .  Patient tolerated this well.  Band-Aid applied.  Did discuss goal of treatment to manage pain and to maintain level of function.  Did discuss that arthritis would not be reversible without surgery.  Recommend conservative management at this time.  Follow-up in 3 to 4 weeks to see how she is doing following injection and use of quality inserts for shoes.   Coner Gibbard L. Lamount MAUL, AACFAS [facility]     [address] [place], [place] [address]                Office [phone]  Fax [phone]

## 2024-06-15 NOTE — Addendum Note (Signed)
 Addended by: LAMOUNT ETHAN CROME on: 06/15/2024 12:22 PM   Modules accepted: Orders

## 2024-06-16 NOTE — Telephone Encounter (Signed)
 Rx for PO diclofenac  from date of visit was not covered by insurance.  Sending in meloxicam once a day as an alternative.

## 2024-06-26 DIAGNOSIS — M5416 Radiculopathy, lumbar region: Secondary | ICD-10-CM | POA: Diagnosis not present

## 2024-06-26 DIAGNOSIS — G5792 Unspecified mononeuropathy of left lower limb: Secondary | ICD-10-CM | POA: Diagnosis not present

## 2024-06-26 DIAGNOSIS — Z6831 Body mass index (BMI) 31.0-31.9, adult: Secondary | ICD-10-CM | POA: Diagnosis not present

## 2024-06-29 ENCOUNTER — Encounter: Payer: Self-pay | Admitting: Family Medicine

## 2024-07-10 ENCOUNTER — Ambulatory Visit: Admitting: Podiatry

## 2024-08-02 ENCOUNTER — Other Ambulatory Visit: Payer: Self-pay | Admitting: Family Medicine

## 2024-08-02 DIAGNOSIS — E119 Type 2 diabetes mellitus without complications: Secondary | ICD-10-CM

## 2024-08-10 ENCOUNTER — Ambulatory Visit: Admitting: Family Medicine

## 2024-08-14 ENCOUNTER — Other Ambulatory Visit: Payer: Self-pay | Admitting: Family Medicine

## 2024-08-14 DIAGNOSIS — S80262A Insect bite (nonvenomous), left knee, initial encounter: Secondary | ICD-10-CM

## 2024-08-14 DIAGNOSIS — L739 Follicular disorder, unspecified: Secondary | ICD-10-CM

## 2024-08-14 NOTE — Telephone Encounter (Signed)
 OV scheduled for 08/21/2024.

## 2024-08-18 ENCOUNTER — Ambulatory Visit (HOSPITAL_BASED_OUTPATIENT_CLINIC_OR_DEPARTMENT_OTHER): Attending: Pulmonary Disease | Admitting: Pulmonary Disease

## 2024-08-18 ENCOUNTER — Encounter: Payer: Self-pay | Admitting: Family Medicine

## 2024-08-18 DIAGNOSIS — G478 Other sleep disorders: Secondary | ICD-10-CM | POA: Diagnosis not present

## 2024-08-18 DIAGNOSIS — G4733 Obstructive sleep apnea (adult) (pediatric): Secondary | ICD-10-CM | POA: Diagnosis not present

## 2024-08-18 DIAGNOSIS — G4761 Periodic limb movement disorder: Secondary | ICD-10-CM | POA: Diagnosis not present

## 2024-08-21 ENCOUNTER — Ambulatory Visit: Admitting: Family Medicine

## 2024-08-21 ENCOUNTER — Encounter: Payer: Self-pay | Admitting: Family Medicine

## 2024-08-21 VITALS — BP 120/67 | HR 60 | Temp 97.2°F | Resp 18 | Wt 166.8 lb

## 2024-08-21 DIAGNOSIS — E119 Type 2 diabetes mellitus without complications: Secondary | ICD-10-CM | POA: Diagnosis not present

## 2024-08-21 DIAGNOSIS — Z7985 Long-term (current) use of injectable non-insulin antidiabetic drugs: Secondary | ICD-10-CM | POA: Diagnosis not present

## 2024-08-21 DIAGNOSIS — L739 Follicular disorder, unspecified: Secondary | ICD-10-CM

## 2024-08-21 DIAGNOSIS — E6609 Other obesity due to excess calories: Secondary | ICD-10-CM | POA: Diagnosis not present

## 2024-08-21 DIAGNOSIS — Z720 Tobacco use: Secondary | ICD-10-CM

## 2024-08-21 DIAGNOSIS — M79672 Pain in left foot: Secondary | ICD-10-CM | POA: Diagnosis not present

## 2024-08-21 DIAGNOSIS — Z6831 Body mass index (BMI) 31.0-31.9, adult: Secondary | ICD-10-CM

## 2024-08-21 DIAGNOSIS — R6889 Other general symptoms and signs: Secondary | ICD-10-CM

## 2024-08-21 DIAGNOSIS — Z23 Encounter for immunization: Secondary | ICD-10-CM | POA: Diagnosis not present

## 2024-08-21 DIAGNOSIS — E66811 Obesity, class 1: Secondary | ICD-10-CM | POA: Diagnosis not present

## 2024-08-21 LAB — POCT GLYCOSYLATED HEMOGLOBIN (HGB A1C): Hemoglobin A1C: 5.8 % — AB (ref 4.0–5.6)

## 2024-08-21 MED ORDER — COVID-19 MRNA VAC-TRIS(PFIZER) 30 MCG/0.3ML IM SUSY
0.3000 mL | PREFILLED_SYRINGE | Freq: Once | INTRAMUSCULAR | 0 refills | Status: AC
Start: 2024-08-21 — End: 2024-08-21

## 2024-08-21 MED ORDER — VARENICLINE TARTRATE 1 MG PO TABS: 1.0000 mg | ORAL_TABLET | Freq: Two times a day (BID) | ORAL | 5 refills | Status: AC

## 2024-08-21 MED ORDER — MUPIROCIN 2 % EX OINT: 1.0000 | TOPICAL_OINTMENT | Freq: Two times a day (BID) | CUTANEOUS | 1 refills | Status: AC

## 2024-08-21 MED ORDER — ONDANSETRON 4 MG PO TBDP: 4.0000 mg | ORAL_TABLET | Freq: Three times a day (TID) | ORAL | 0 refills | Status: DC | PRN

## 2024-08-21 MED ORDER — SEMAGLUTIDE (2 MG/DOSE) 8 MG/3ML ~~LOC~~ SOPN: 2.0000 mg | PEN_INJECTOR | SUBCUTANEOUS | 0 refills | Status: DC

## 2024-08-21 NOTE — Progress Notes (Signed)
 Assessment & Plan   Assessment/Plan:    Assessment & Plan Type 2 diabetes mellitus Diabetes is well controlled with an A1c of 5.8%. Currently managed with Ozempic  1 mg weekly. Weight loss is ongoing, contributing to improved diabetes management. Increasing Ozempic  may further aid in weight loss and diabetes control. - Increase Ozempic  to 2 mg weekly to further aid in weight loss and diabetes control  Obesity BMI is 31. Weight has decreased from 185 lbs to 165 lbs, showing progress in weight management. Continued weight loss is encouraged to reduce risks of heart disease, cancer, and kidney disease. - Continue current weight loss efforts with increased Ozempic  dosage  Hyperlipidemia Hyperlipidemia is managed with atorvastatin  20 mg.  Chronic low back pain with lumbar degenerative disc and facet disease and bilateral lumbar radiculopathy with sciatica Chronic low back pain with MRI showing facet degenerative disease at L4-L5. Bilateral lumbar radiculopathy with sciatica is present. Pain management includes tizanidine  and diclofenac  as needed. Awaiting nerve study results. - Continue current pain management regimen with tizanidine  and diclofenac  as needed - Await results of nerve study for further evaluation  Chronic right foot and ankle pain, likely radicular Chronic right foot and ankle pain likely related to lumbar radiculopathy. Recent steroid injection provided temporary relief. Awaiting nerve study results for further evaluation. - Await results of nerve study for further evaluation  Seasonal allergic rhinitis Seasonal allergies managed with over-the-counter medications. Sudafed provides relief but is used cautiously due to potential side effects such as increased blood pressure and heart rate. Referral to allergist considered if symptoms persist. - Consider referral to allergist if symptoms persist  Tobacco use disorder Currently using varenicline  to aid in smoking cessation.  Previous attempts to quit were unsuccessful without medication support. Varenicline  is effective but causes nausea, managed with Zofran . - Continue varenicline  for smoking cessation  Nausea secondary to medication Nausea managed with Zofran , particularly related to varenicline  use. Zofran  is effective in managing symptoms. - Refill Zofran  prescription for nausea management  General Health Maintenance Requested flu and COVID vaccinations. Flu shot administered today. Prescription for COVID vaccine provided to be filled at pharmacy. - Administered flu shot today - Provided prescription for COVID vaccine      Medications Discontinued During This Encounter  Medication Reason   phentermine  (ADIPEX-P ) 37.5 MG tablet    nicotine  (NICODERM CQ  - DOSED IN MG/24 HOURS) 21 mg/24hr patch    predniSONE  (STERAPRED UNI-PAK 21 TAB) 10 MG (21) TBPK tablet    Semaglutide , 1 MG/DOSE, (OZEMPIC , 1 MG/DOSE,) 4 MG/3ML SOPN    ondansetron  (ZOFRAN -ODT) 4 MG disintegrating tablet Reorder    Return in about 3 months (around 11/20/2024) for DM, obesity.        Subjective:   Encounter date: 08/21/2024  Robin Small is a 63 y.o. female who has Dyslipidemia; ANXIETY DEPRESSION; Mild intermittent asthma; LUMBAR SPRAIN AND STRAIN; External hemorrhoids; Muscle ache; Dyspnea; Sleep apnea; Sinusitis, acute; History of colonic polyps; Seasonal and perennial allergic rhinitis; Primary osteoarthritis of left knee; Primary osteoarthritis of right knee; Lichen sclerosus; S/P hemorrhoidectomy; TMJ (temporomandibular joint syndrome); Seasonal allergic rhinitis due to pollen; Class 1 obesity with serious comorbidity and body mass index (BMI) of 33.0 to 33.9 in adult; Sleep disturbance; Hemorrhoids, internal; Chronic pain of both knees; Tobacco abuse; Hyperlipidemia associated with type 2 diabetes mellitus (HCC); Post-operative nausea and vomiting; Type 2 diabetes mellitus without complication, without long-term current use  of insulin (HCC); Chronic pain of left ankle; Chronic midline low back pain with left-sided  sciatica; Extrinsic asthma; Type 2 diabetes mellitus with neurological complications (HCC); Left foot pain; Thyroid  nodule; Aortic atherosclerosis (HCC); Multinodular goiter; Panlobular emphysema (HCC); Folliculitis of axilla; and Coronary artery calcification seen on CAT scan on their problem list..   She  has a past medical history of Allergy, Anxiety, Asthma, Depression, Diverticulosis (03/2016), High cholesterol, History of kidney stones, Lichen sclerosus, Low blood pressure reading, OA (osteoarthritis), OSA on CPAP, Panlobular emphysema (HCC) (05/08/2024), Perianal cyst, Personal history of colonic polyps (04/06/2011), PONV (postoperative nausea and vomiting), PPD positive, Pre-diabetes, Seasonal allergies, Sleep apnea, Thyroid  nodule (02/04/2024), and Type 2 diabetes mellitus without complication, without long-term current use of insulin (HCC) (05/27/2023)..   She presents with chief complaint of Diabetes (3 month follow up DM and weight management. Pt is fasting //HM due- shingles vaccine (patient declined) and flu; diabetic eye exam is needed; RSV vaccine was given on 08/01/2024 @ [facility] ) and Back Pain (Pt c/o of lower back pain; MRI lumbar done on 05/2024; with complaints of leg and foot pain ) .   Discussed the use of AI scribe software for clinical note transcription with the patient, who gave verbal consent to proceed.  History of Present Illness Robin Small is a 63 year old female with diabetes who presents for a follow-up visit.  Her diabetes is well controlled with an A1c of 5.8, managed with Ozempic  1 mg weekly. She has experienced gradual weight loss, currently weighing between 163 to 165 pounds, down from an average of 185 pounds over the past 30 years.  She has chronic lower back pain with a recent MRI showing facet degenerative disease at L4 to L5 and trace anterolisthesis. She experiences  bilateral radiculopathy with sciatic symptoms. For pain management, she takes tizanidine  as needed, diclofenac  once daily, and gabapentin for nerve pain. She uses an ankle brace for support. Her foot and ankle pain has been ongoing for a couple of years. A steroid injection provided temporary relief, but the pain returns with activity. A nerve study is scheduled for next week.  She has hyperlipidemia, managed with atorvastatin  20 mg. Her blood pressure is well controlled at 120/67.  She experiences seasonal allergies and congestion, occasionally taking Sudafed 12-hour, but is cautious due to potential side effects. She has tried various allergy medications without significant relief.  She recently underwent a sleep study and is awaiting results, hoping for improvement in her sleep apnea, which she suspects may be improving due to weight loss. She has not required a CPAP mask during the study.  She has quit smoking with the aid of varenicline  and experiences nausea, for which she uses Zofran . She is also taking vitamin D, magnesium, folic acid, and vitamin C supplements.  She is active, caring for her grandson during the day, and engages in activities like swimming and walking, although prolonged activity exacerbates her foot and ankle pain.     ROS  Past Surgical History:  Procedure Laterality Date   ARTHROSCOPIC KNEE Left    CESAREAN SECTION     COLONOSCOPY W/ POLYPECTOMY  04/11/2016   ENDOMETRIAL ABLATION     GANGLION CYST EXCISION Right    HEMORROIDECTOMY     JOINT REPLACEMENT  both knees 2020   lichen sclerosus lesion excision     Anus   TOTAL KNEE ARTHROPLASTY Left 03/03/2019   Procedure: TOTAL KNEE ARTHROPLASTY;  Surgeon: Sheril Coy, MD;  Location: [facility];  Service: Orthopedics;  Laterality: Left;   TOTAL KNEE ARTHROPLASTY Right 09/15/2019   Procedure:  RIGHT TOTAL KNEE ARTHROPLASTY;  Surgeon: Sheril Coy, MD;  Location: [facility];  Service: Orthopedics;  Laterality: Right;    TUBAL LIGATION  2001    Outpatient Medications Prior to Visit  Medication Sig Dispense Refill   acetaminophen  (TYLENOL ) 500 MG tablet Take 1,000 mg by mouth every 6 (six) hours as needed for moderate pain.     albuterol  (VENTOLIN  HFA) 108 (90 Base) MCG/ACT inhaler Inhale 1-2 puffs into the lungs every 6 (six) hours as needed. 8 g 0   atorvastatin  (LIPITOR) 20 MG tablet TAKE 1 TABLET BY MOUTH EVERY DAY 90 tablet 3   Azelastine  HCl 137 MCG/SPRAY SOLN SPRAY ONCE IN EACH NOSTRIL TWICE DAILY AS DIRECTED 30 mL 11   brompheniramine-pseudoephedrine -DM 30-2-10 MG/5ML syrup Take 5 mLs by mouth 4 (four) times daily as needed. 120 mL 0   Calcium  Carbonate-Vitamin D (CALCIUM -D) 600-400 MG-UNIT TABS Take 2 tablets by mouth at bedtime.     chlorhexidine  (HIBICLENS ) 4 % external liquid Twice a day, rinse affected area water , THEN apply minimum amount of 4% solution necessary to cover skin or wound area and wash gently, THEN rinse water  473 mL 0   Cholecalciferol 1.25 MG (50000 UT) capsule Take 1 capsule every week by oral route.     clobetasol cream (TEMOVATE) 0.05 % Apply 1 application topically 2 (two) times daily as needed (skin irritation).     diclofenac  Sodium (VOLTAREN ) 1 % GEL APPLY 4 GRAMS TOPICALLY TO THE AFFECTED AREA FOUR TIMES DAILY AS NEEDED FOR PAIN 300 g 0   fexofenadine  (ALLEGRA  ALLERGY) 180 MG tablet Take 1 tablet (180 mg total) by mouth daily. 90 tablet 0   fluticasone  (FLONASE ) 50 MCG/ACT nasal spray SPRAY 2 SPRAYS INTO EACH NOSTRIL EVERY DAY 48 mL 1   ibuprofen (ADVIL) 600 MG tablet Take 1 tablet (600 mg total) by mouth every 8 (eight) hours as needed for moderate pain (pain score 4-6). 60 tablet 0   metoCLOPramide  (REGLAN ) 5 MG tablet Take 1 tablet (5 mg total) by mouth every 8 (eight) hours as needed for nausea. 30 tablet 0   montelukast  (SINGULAIR ) 10 MG tablet TAKE 1 TABLET BY MOUTH EVERYDAY AT BEDTIME 90 tablet 2   nystatin  (MYCOSTATIN ) 100000 UNIT/ML suspension Take 5 mLs (500,000  Units total) by mouth 4 (four) times daily. Swish and swallow 200 mL 0   tiZANidine  (ZANAFLEX ) 4 MG capsule tizanidine  4 mg capsule     triamcinolone  (KENALOG ) 0.1 % paste Use as directed 1 Application in the mouth or throat 2 (two) times daily. 5 g 12   zolpidem  (AMBIEN ) 10 MG tablet Take 1 tablet (10 mg total) by mouth at bedtime as needed. for sleep 90 tablet 1   ondansetron  (ZOFRAN -ODT) 4 MG disintegrating tablet Take 1-2 tablets (4-8 mg total) by mouth every 8 (eight) hours as needed. 20 tablet 0   predniSONE  (STERAPRED UNI-PAK 21 TAB) 10 MG (21) TBPK tablet Take following package directions. 21 tablet 0   Semaglutide , 1 MG/DOSE, (OZEMPIC , 1 MG/DOSE,) 4 MG/3ML SOPN INJECT 1 MG ONCE A WEEK AS DIRECTED 3 mL 2   nicotine  (NICODERM CQ  - DOSED IN MG/24 HOURS) 21 mg/24hr patch RX #1 Weeks 1-4: 21 mg x 1 patch daily. Wear for 24 hours. If you have sleep disturbances, remove at bedtime. (Patient not taking: Reported on 06/01/2024) 28 patch 0   phentermine  (ADIPEX-P ) 37.5 MG tablet Take 1 tablet (37.5 mg total) by mouth daily before breakfast. (Patient not taking: Reported on 08/21/2024) 90 tablet  1   Facility-Administered Medications Prior to Visit  Medication Dose Route Frequency Provider Last Rate Last Admin   triamcinolone  acetonide (KENALOG ) 10 MG/ML injection 10 mg  10 mg Other Once         Family History  Problem Relation Age of Onset   Colon polyps Mother    Diabetes Mother    Hypertension Mother    Heart disease Mother    Hyperlipidemia Mother    Arthritis Mother    Kidney disease Mother    Stroke Mother    Heart attack Mother        2018 heart failure   Lung cancer Father    Varicose Veins Sister    Alcohol abuse Brother    Hepatitis Brother        hep c   Early death Brother    Drug abuse Brother    Breast cancer Maternal Grandmother        breast   Colon cancer Neg Hx    Esophageal cancer Neg Hx    Rectal cancer Neg Hx    Stomach cancer Neg Hx     Social History    Socioeconomic History   Marital status: Married    Spouse name: Not on file   Number of children: Not on file   Years of education: Not on file   Highest education level: Associate degree: academic program  Occupational History   Occupation: internet support  Tobacco Use   Smoking status: Former    Current packs/day: 0.00    Average packs/day: 0.5 packs/day for 30.0 years (15.0 ttl pk-yrs)    Types: Cigarettes    Start date: 07/30/1989    Quit date: 07/31/2019    Years since quitting: 5.0    Passive exposure: Never   Smokeless tobacco: Never   Tobacco comments:    1 pack per week on and off        Pt stop smoking 1 month ago (August 2025)   Vaping Use   Vaping status: Never Used  Substance and Sexual Activity   Alcohol use: Not Currently    Comment: Maybe once a month   Drug use: No   Sexual activity: Yes    Partners: Male    Birth control/protection: Post-menopausal, Surgical  Other Topics Concern   Not on file  Social History Narrative   Exercise-- daily for 1 hour   Social Drivers of Health   Financial Resource Strain: High Risk (05/28/2024)   Overall Financial Resource Strain (CARDIA)    Difficulty of Paying Living Expenses: Hard  Food Insecurity: Food Insecurity Present (05/28/2024)   Hunger Vital Sign    Worried About Running Out of Food in the Last Year: Sometimes true    Ran Out of Food in the Last Year: Never true  Transportation Needs: Unmet Transportation Needs (05/28/2024)   PRAPARE - Transportation    Lack of Transportation (Medical): Yes    Lack of Transportation (Non-Medical): Yes  Physical Activity: Sufficiently Active (05/28/2024)   Exercise Vital Sign    Days of Exercise per Week: 3 days    Minutes of Exercise per Session: 60 min  Stress: No Stress Concern Present (05/28/2024)   [employer] of Occupational Health - Occupational Stress Questionnaire    Feeling of Stress: Only a little  Social Connections: Moderately Integrated (05/28/2024)    Social Connection and Isolation Panel    Frequency of Communication with Friends and Family: Three times a week    Frequency of Social  Gatherings with Friends and Family: More than three times a week    Attends Religious Services: Never    Database administrator or Organizations: Yes    Attends Banker Meetings: Never    Marital Status: Married  Catering manager Violence: Not on file                                                                                                  Objective:  Physical Exam: BP 120/67 (BP Location: Left Arm, Patient Position: Sitting, Cuff Size: Large)   Pulse 60   Temp (!) 97.2 F (36.2 C) (Temporal)   Resp 18   Wt 166 lb 12.8 oz (75.7 kg)   SpO2 98%   BMI 31.52 kg/m   Wt Readings from Last 3 Encounters:  08/21/24 166 lb 12.8 oz (75.7 kg)  08/18/24 167 lb (75.8 kg)  06/01/24 170 lb 3.2 oz (77.2 kg)    Physical Exam VITALS: BP- 120/67 MEASUREMENTS: Weight- 165, BMI- 31.0. GENERAL: Alert, cooperative, well developed, no acute distress HEENT: Normocephalic, normal oropharynx, moist mucous membranes CHEST: Clear to auscultation bilaterally, no wheezes, rhonchi, or crackles CARDIOVASCULAR: Normal heart rate and rhythm, S1 and S2 normal without murmurs ABDOMEN: Soft, non-tender, non-distended, without organomegaly, normal bowel sounds EXTREMITIES: No cyanosis or edema NEUROLOGICAL: Cranial nerves grossly intact, moves all extremities without gross motor or sensory deficit   Physical Exam  DG Foot Complete Left Result Date: 06/16/2024 Please see detailed radiograph report in office note.  CT CARDIAC SCORING (SELF PAY ONLY) Addendum Date: 06/14/2024 ADDENDUM REPORT: 06/14/2024 14:55 EXAM: OVER-READ INTERPRETATION  CT CHEST The following report is an over-read performed by radiologist Dr. Andrea Gasman of [facility] on 06/14/2024. This over-read does not include interpretation of cardiac or coronary anatomy or  pathology. The coronary calcium  score interpretation by the cardiologist is attached. COMPARISON:  Lung cancer screening CT 02/14/2024 FINDINGS: Vascular: Aortic atherosclerosis. The included aorta is normal in caliber. Mediastinum/nodes: No adenopathy or mass. Unremarkable esophagus. Lungs: No focal airspace disease. Pulmonary nodules were better appreciated on prior lung cancer screening CT, included nodules are not significantly changed. No pleural fluid. The included airways are patent. Upper abdomen: No acute or unexpected findings. Musculoskeletal: There are no acute or suspicious osseous abnormalities. IMPRESSION: Aortic Atherosclerosis (ICD10-I70.0). Electronically Signed   By: Andrea Gasman M.D.   On: 06/14/2024 14:55   Result Date: 06/14/2024 : CLINICAL DATA:  Cardiovascular Disease Risk stratification EXAM: Coronary Calcium  Score TECHNIQUE: A gated, non-contrast computed tomography scan of the heart was performed using 3mm slice thickness. Axial images were analyzed on a dedicated workstation. Calcium  scoring of the coronary arteries was performed using the Agatston method. FINDINGS: Coronary Calcium  Score: Left main: 0 Left anterior descending artery: 43.8 Left circumflex artery: 0 Right coronary artery: 0 Total: 43.8 Pericardium: Normal. Ascending Aorta: Normal caliber. Pulmonary artery: Normal caliber Non-cardiac: See separate report from [facility]. IMPRESSION: Coronary calcium  score of 43.8. This was 67 percentile for age-, race-, and sex-matched controls. RECOMMENDATIONS: Coronary artery calcium  (CAC) score is a strong predictor of incident coronary heart disease (  CHD) and provides predictive information beyond traditional risk factors. CAC scoring is reasonable to use in the decision to withhold, postpone, or initiate statin therapy in intermediate-risk or selected borderline-risk asymptomatic adults (age 43-75 years and LDL-C >=70 to <190 mg/dL) who do not have diabetes or  established atherosclerotic cardiovascular disease (ASCVD).* In intermediate-risk (10-year ASCVD risk >=7.5% to <20%) adults or selected borderline-risk (10-year ASCVD risk >=5% to <7.5%) adults in whom a CAC score is measured for the purpose of making a treatment decision the following recommendations have been made: If CAC=0, it is reasonable to withhold statin therapy and reassess in 5 to 10 years, as long as higher risk conditions are absent (diabetes mellitus, family history of premature CHD in first degree relatives (males <55 years; females <65 years), cigarette smoking, or LDL >=190 mg/dL). If CAC is 1 to 99, it is reasonable to initiate statin therapy for patients >=64 years of age. If CAC is >=100 or >=75th percentile, it is reasonable to initiate statin therapy at any age. Cardiology referral should be considered for patients with CAC scores >=400 or >=75th percentile. *2018 AHA/ACC/AACVPR/AAPA/ABC/ACPM/ADA/AGS/APhA/ASPC/NLA/PCNA Guideline on the Management of Blood Cholesterol: A Report of the American College of Cardiology/American Heart Association Task Force on Clinical Practice Guidelines. J Am Coll Cardiol. 2019;73(24):3168-3209. Electronically Signed: By: Lamar Fitch M.D. On: 06/05/2024 17:10    Recent Results (from the past 2160 hours)  POCT glycosylated hemoglobin (Hb A1C)     Status: Abnormal   Collection Time: 08/21/24  9:50 AM  Result Value Ref Range   Hemoglobin A1C 5.8 (A) 4.0 - 5.6 %   HbA1c POC (<> result, manual entry)     HbA1c, POC (prediabetic range)     HbA1c, POC (controlled diabetic range)          Beverley Adine Hummer, MD, MS

## 2024-08-21 NOTE — Telephone Encounter (Signed)
 Pt received signed, printed script during her OV with PCP today 08/21/24.

## 2024-08-23 ENCOUNTER — Telehealth: Payer: Self-pay | Admitting: Pulmonary Disease

## 2024-08-23 NOTE — Procedures (Signed)
  Indications for Polysomnography The patient is a 63 year old Female who is 5' 1 and weighs 167.0 lbs. Her BMI equals 31.9.  A full night polysomnogram was performed to evaluate for -.  Medication Taken at 2115:ZOLPIDEM  Polysomnogram Data A full night polysomnogram recorded the standard physiologic parameters including EEG, EOG, EMG, EKG, nasal and oral airflow.  Respiratory parameters of chest and abdominal movements were recorded with Respiratory Inductance Plethysmography belts.   Oxygen  saturation was recorded by pulse oximetry.  Sleep Architecture The total recording time of the polysomnogram was 397.4 minutes.  The total sleep time was 318.5 minutes.  The patient spent 2.7% of total sleep time in Stage N1, 13.2% in Stage N2, 81.8% in Stages N3, and 2.4% in REM.  Sleep latency was 43.8 minutes.   REM latency was 253.5 minutes.  Sleep Efficiency was 80.2%.  Wake after Sleep Onset time was 35.0 minutes.  Respiratory Events The polysomnogram revealed a presence of 1 obstructive, - central, and - mixed apneas resulting in an Apnea index of 0.2 events per hour.  There were 121 hypopneas (GreaterEqual to3% desaturation and/or arousal) resulting in an Apnea\Hypopnea Index (AHI  GreaterEqual to3% desaturation and/or arousal) of 23.0 events per hour.  There were 86 hypopneas (GreaterEqual to4% desaturation) resulting in an Apnea\Hypopnea Index (AHI GreaterEqual to4% desaturation) of 16.4 events per hour.  There were 34  Respiratory Effort Related Arousals resulting in a RERA index of 6.4 events per hour. The Respiratory Disturbance Index is 29.4 events per hour.  The snore index was 774.3 events per hour.  Mean oxygen  saturation was 90.4%.  The lowest oxygen  saturation during sleep was 65.0%.  Time spent LessEqual to88% oxygen  saturation was  minutes ().  Limb Activity There were 40 total limb movements recorded, of this total, 40 were classified as PLMs.  PLM index was 7.5 per hour and PLM  associated with Arousals index was 0.2 per hour.  Cardiac Summary The average pulse rate was 61.9 bpm.  The minimum pulse rate was 51.0 bpm while the maximum pulse rate was 91.0 bpm.  Cardiac rhythm was normal/abnormal.  Comments:  Diagnosis: Moderate obstructive sleep apnea with severe oxygen  desaturations AHI of 23, O2 nadir of 65% with oxygen  below 88% for 86 minutes Fair sleep efficiency Delayed REM latency, decreased REM sleep percentage Mild periodic limb movement Cardiac rhythm was sinus with PACs noted  Recommendations:  In-lab titration study is recommended because of the severe desaturations. May require oxygen  supplementation with CPAP therapy, this can be accomplished with a titration study Encourage behaviors that promotes adequate sleep Encourage weight loss measures  Clinical follow-up symptoms  This study was personally reviewed and electronically signed by: NEDA JENNET LABOR, MD Accredited Board Certified in Sleep Medicine Date/Time: 08/23/2024

## 2024-08-23 NOTE — Procedures (Signed)
 Darryle Law [facility] [address] [place], [place] [address] Tel: [phone]   Fax: [phone]  Polysomnography Interpretation  Patient Name:  Robin Small, Robin Small Date:  08/18/2024 Referring Physician:  JENNET EPLEY [phone]) %%startinterp%% Indications for Polysomnography The patient is a 63 year old Female who is 5' 1 and weighs 167.0 lbs. Her BMI equals 31.9.  A full night polysomnogram was performed to evaluate for -.  Medication Taken at 2115:  ZOLPIDEM    Polysomnogram Data A full night polysomnogram recorded the standard physiologic parameters including EEG, EOG, EMG, EKG, nasal and oral airflow.  Respiratory parameters of chest and abdominal movements were recorded with Respiratory Inductance Plethysmography belts.  Oxygen  saturation was recorded by pulse oximetry.   Sleep Architecture The total recording time of the polysomnogram was 397.4 minutes.  The total sleep time was 318.5 minutes.  The patient spent 2.7% of total sleep time in Stage N1, 13.2% in Stage N2, 81.8% in Stages N3, and 2.4% in REM.  Sleep latency was 43.8 minutes.  REM latency was 253.5 minutes.  Sleep Efficiency was 80.2%.  Wake after Sleep Onset time was 35.0 minutes.  Respiratory Events The polysomnogram revealed a presence of 1 obstructive, - central, and - mixed apneas resulting in an Apnea index of 0.2 events per hour.  There were 121 hypopneas (>=3% desaturation and/or arousal) resulting in an Apnea\Hypopnea Index (AHI >=3% desaturation and/or arousal) of 23.0 events per hour.  There were 86 hypopneas (>=4% desaturation) resulting in an Apnea\Hypopnea Index (AHI >=4% desaturation) of 16.4 events per hour.  There were 34 Respiratory Effort Related Arousals resulting in a RERA index of 6.4 events per hour. The Respiratory Disturbance Index is 29.4 events per hour.  The snore index was 774.3 events per hour.  Mean oxygen  saturation was 90.4%.  The lowest oxygen  saturation  during sleep was 65.0%.  Time spent <=88% oxygen  saturation was 86.5 minutes (22.3%).  Limb Activity There were 40 total limb movements recorded, of this total, 40 were classified as PLMs.  PLM index was 7.5 per hour and PLM associated with Arousals index was 0.2 per hour.  Cardiac Summary The average pulse rate was 61.9 bpm.  The minimum pulse rate was 51.0 bpm while the maximum pulse rate was 91.0 bpm.  Cardiac rhythm was normal/abnormal.  Comments:   Diagnosis:  Moderate obstructive sleep apnea with severe oxygen  desaturations AHI of 23, O2 nadir of 65% with oxygen  below 88% for 86 minutes  Fair sleep efficiency Delayed REM latency, decreased REM sleep percentage Mild periodic limb movement Cardiac rhythm was sinus with PACs noted  Recommendations:  In-lab titration study is recommended because of the severe desaturations. May require oxygen  supplementation with CPAP therapy, this can be accomplished with a titration study Encourage behaviors that promotes adequate sleep Encourage weight loss measures  Clinical follow-up symptoms  This study was personally reviewed and electronically signed by: EPLEY JENNET LABOR, MD Accredited Board Certified in Sleep Medicine Date/Time:  08/23/2024    %%endinterp%%   Diagnostic PSG Report  Patient Name: Robin Small, Robin Small Study Date: 08/18/2024  Date of Birth: 01/07/1961 Study Type: Diagnostic  Age: 63 year MRN #: [id number]  Sex: Female Interpreting Physician: EPLEY JENNET, [id number]  Height: 5' 1 Referring Physician: JENNET EPLEY ([id number])  Weight: 167.0 lbs Recording Tech: Charlie George RPSGT  BMI: 31.9 Scoring Tech: Charlie George RPSGT  ESS: 2 Neck Size: 14.5   Study Overview  Lights Off: 10:24:07 PM  Count Index  Lights On: 05:01:29 AM Awakenings:  15 2.8  Time in Bed: 397.4 min. Arousals: 85 16.0  Total Sleep Time: 318.5 min. AHI (>=3% Desat and/or Ar.): 122 23.0   Sleep Efficiency: 80.2% AHI (>=4% Desat): 87 16.4    Sleep Latency: 43.8 min. Limb Movements: 40 7.5  Wake After Sleep Onset: 35.0 min. Snore: 4110 774.3  REM Latency from Sleep Onset: 253.5 min. Desaturations: 122 23.0     Minimum SpO2 TST: 65.0%    Sleep Architecture  % of Time in Bed Stages Time (mins) % Sleep Time  Wake 79.0   Stage N1 8.5 2.7%  Stage N2 42.0 13.2%  Stage N3 260.5 81.8%  REM 7.5 2.4%   Arousal Summary   NREM REM Sleep Index  Respiratory Arousals 75 3 78 14.7  PLM Arousals 1 - 1 0.2  Isolated Limb Movement Arousals - - - -  Snore Arousals 4 - 4 0.8  Spontaneous Arousals 3 - 3 0.6  Total 82 3 85 16.0   Limb Movement Summary   Count Index  Isolated Limb Movements - -  Periodic Limb Movements (PLMs) 40 7.5  Total Limb Movements 40 7.5    Respiratory Summary   By Sleep Stage By Body Position Total   NREM REM Supine Non-Supine   Time (min) 311.0 7.5 68.5 250.0 318.5         Obstructive Apnea 1 - - 1 1  Mixed Apnea - - - - -  Central Apnea - - - - -  Total Apneas 1 - - 1 1  Total Apnea Index 0.2 - - 0.2 0.2         Hypopneas (>=3% Desat and/or Ar.) 112 9 14 107 121  AHI (>=3% Desat and/or Ar.) 21.8 72.0 12.3 25.9 23.0         Hypopneas (>=4% Desat) 77 9 9 77 86  AHI (>=4% Desat) 15.0 72.0 7.9 18.7 16.4          RERAs 34 - 5 29 34  RERA Index 6.6 - 4.4 7.0 6.4         RDI 28.4 72.0 16.6 32.9 29.4    Respiratory Event Type Index  Central Apneas -  Obstructive Apneas 0.2  Mixed Apneas -  Central Hypopneas -  Obstructive Hypopneas 22.8  Central Apnea + Hypopnea (CAHI) -  Obstructive Apnea + Hypopnea (OAHI) 23.0   Respiratory Event Durations   Apnea Hypopnea   NREM REM NREM REM  Average (seconds) 18.7 - 30.1 31.9  Maximum (seconds) 18.7 - 55.5 61.5    Oxygen  Saturation Summary   Wake NREM REM TST TIB  Average SpO2 (%) 94.0% 89.9% 81.6% 89.7% 90.4%  Minimum SpO2 (%) 84.0% 66.0% 65.0% 65.0% 65.0%  Maximum SpO2 (%) 99.0% 97.0% 95.0% 97.0% 99.0%   Oxygen  Saturation  Distribution  Range (%) Time in range (min) Time in range (%)  90.0 - 100.0 191.4 49.3%  80.0 - 90.0 186.8 48.1%  70.0 - 80.0 3.3 0.9%  60.0 - 70.0 1.1 0.3%  50.0 - 60.0 - -  0.0 - 50.0 - -  Time Spent <=88% SpO2  Range (%) Time in range (min) Time in range (%)  0.0 - 88.0 86.5 22.3%      Count Index  Desaturations 122 23.0    Cardiac Summary   Wake NREM REM Sleep Total  Average Pulse Rate (BPM) 66.7 60.7 65.0 60.8 61.9  Minimum Pulse Rate (BPM) 56.0 51.0 58.0 51.0 51.0  Maximum Pulse Rate (BPM) 91.0 89.0 80.0 89.0  91.0   Pulse Rate Distribution:  Range (bpm) Time in range (min) Time in range (%)  0.0 - 40.0 - -  40.0 - 60.0 176.2 44.9%  60.0 - 80.0 206.9 52.7%  80.0 - 100.0 4.0 1.0%  100.0 - 120.0 - -  120.0 - 140.0 - -  140.0 - 200.0 - -      Hypnograms                      Technologist Comments  The patient arrived for a split night study. The patient was placed in room 5. The patient has a history of CPAP use. The procedure was explained, and questions were answered. Prior to hook up, the patient was fitted for a mask and trialed CPAP at a pressure of 5 cm H2O. The patient did not have a high enough AHI to meet split night criteria. The patient was fitted for a XS F&P Amara View full-face mask. The patient slept in the left, right, and supine positions. Mild PLMs and oral venting were observed.  No bruxism, seizure, or spike wave activity was observed. Snoring was moderate, but not audible. PACs were observed. All stages of sleep were recorded. The patient had one-bathroom break.

## 2024-08-23 NOTE — Telephone Encounter (Signed)
 Call patient  Sleep study result  Date of study: 08/18/2024  Impression: Moderate obstructive sleep apnea with severe oxygen  desaturations AHI of 23 with O2 nadir of 65%, oxygen  was below 88% for 86 minutes  Recommendation:  Recommend in-lab titration study  May require oxygen  supplementation with CPAP therapy, this can be accomplished by an in-lab titration study  Encourage weight loss measures  Clinical follow-up of symptoms

## 2024-08-24 ENCOUNTER — Other Ambulatory Visit: Payer: Self-pay

## 2024-08-24 DIAGNOSIS — M5417 Radiculopathy, lumbosacral region: Secondary | ICD-10-CM | POA: Diagnosis not present

## 2024-08-24 DIAGNOSIS — G4733 Obstructive sleep apnea (adult) (pediatric): Secondary | ICD-10-CM

## 2024-08-24 NOTE — Telephone Encounter (Signed)
 Called patient.  Reviewed all information.  Patient agreed with CPAP in-lab titration study.  Will place orders only encounter.  Patient verbalized understanding.

## 2024-08-31 ENCOUNTER — Ambulatory Visit: Admitting: Cardiology

## 2024-09-01 ENCOUNTER — Ambulatory Visit: Admitting: Cardiology

## 2024-09-07 DIAGNOSIS — M5416 Radiculopathy, lumbar region: Secondary | ICD-10-CM | POA: Diagnosis not present

## 2024-09-07 DIAGNOSIS — Z6831 Body mass index (BMI) 31.0-31.9, adult: Secondary | ICD-10-CM | POA: Diagnosis not present

## 2024-09-07 DIAGNOSIS — M4316 Spondylolisthesis, lumbar region: Secondary | ICD-10-CM | POA: Diagnosis not present

## 2024-09-13 ENCOUNTER — Other Ambulatory Visit: Payer: Self-pay | Admitting: Family Medicine

## 2024-09-13 DIAGNOSIS — E1149 Type 2 diabetes mellitus with other diabetic neurological complication: Secondary | ICD-10-CM

## 2024-09-16 DIAGNOSIS — M5416 Radiculopathy, lumbar region: Secondary | ICD-10-CM | POA: Diagnosis not present

## 2024-09-17 ENCOUNTER — Telehealth: Payer: Self-pay | Admitting: Family Medicine

## 2024-09-17 NOTE — Telephone Encounter (Unsigned)
 Copied from CRM [id number]. Topic: Clinical - Medication Refill >> Sep 17, 2024  2:51 PM Franky GRADE wrote: Medication: gabapentin (NEURONTIN) 100 MG capsule [[id number]]  Has the patient contacted their pharmacy? No (Agent: If no, request that the patient contact the pharmacy for the refill. If patient does not wish to contact the pharmacy document the reason why and proceed with request.) (Agent: If yes, when and what did the pharmacy advise?)  This is the patient's preferred pharmacy:  [id number] [address], [place] - [address] [place] [address] Phone: [phone] Fax: [phone]  Is this the correct pharmacy for this prescription? Yes If no, delete pharmacy and type the correct one.   Has the prescription been filled recently? No  Is the patient out of the medication? Yes  Has the patient been seen for an appointment in the last year OR does the patient have an upcoming appointment? Yes  Can we respond through MyChart? Yes  Agent: Please be advised that Rx refills may take up to 3 business days. We ask that you follow-up with your pharmacy.

## 2024-09-18 ENCOUNTER — Ambulatory Visit: Admitting: Family Medicine

## 2024-09-21 ENCOUNTER — Encounter: Payer: Self-pay | Admitting: Physician Assistant

## 2024-09-21 ENCOUNTER — Telehealth: Admitting: Physician Assistant

## 2024-09-21 ENCOUNTER — Other Ambulatory Visit: Payer: Self-pay | Admitting: Family Medicine

## 2024-09-21 DIAGNOSIS — B354 Tinea corporis: Secondary | ICD-10-CM | POA: Diagnosis not present

## 2024-09-21 MED ORDER — KETOCONAZOLE 2 % EX CREA: 1.0000 | TOPICAL_CREAM | Freq: Two times a day (BID) | CUTANEOUS | 0 refills | Status: DC

## 2024-09-21 MED ORDER — TERBINAFINE HCL 250 MG PO TABS: 250.0000 mg | ORAL_TABLET | Freq: Every day | ORAL | 0 refills | Status: DC

## 2024-09-21 NOTE — Patient Instructions (Signed)
 Robin Small, thank you for joining Robin CHRISTELLA Dickinson, PA-C for today's virtual visit.  While this provider is not your primary care provider (PCP), if your PCP is located in our provider database this encounter information will be shared with them immediately following your visit.   A [facility] MyChart account gives you access to today's visit and all your visits, tests, and labs performed at [facility]  click here if you don't have a [facility] MyChart account or go to [email or web address]  Consent: (Patient) Robin Small provided verbal consent for this virtual visit at the beginning of the encounter.  Current Medications:  Current Outpatient Medications:    ketoconazole (NIZORAL) 2 % cream, Apply 1 Application topically 2 (two) times daily., Disp: 30 g, Rfl: 0   terbinafine (LAMISIL) 250 MG tablet, Take 1 tablet (250 mg total) by mouth daily., Disp: 14 tablet, Rfl: 0   acetaminophen  (TYLENOL ) 500 MG tablet, Take 1,000 mg by mouth every 6 (six) hours as needed for moderate pain., Disp: , Rfl:    albuterol  (VENTOLIN  HFA) 108 (90 Base) MCG/ACT inhaler, Inhale 1-2 puffs into the lungs every 6 (six) hours as needed., Disp: 8 g, Rfl: 0   atorvastatin  (LIPITOR) 20 MG tablet, TAKE 1 TABLET BY MOUTH EVERY DAY, Disp: 90 tablet, Rfl: 3   Azelastine  HCl 137 MCG/SPRAY SOLN, SPRAY ONCE IN EACH NOSTRIL TWICE DAILY AS DIRECTED, Disp: 30 mL, Rfl: 11   brompheniramine-pseudoephedrine -DM 30-2-10 MG/5ML syrup, Take 5 mLs by mouth 4 (four) times daily as needed., Disp: 120 mL, Rfl: 0   Calcium  Carbonate-Vitamin D (CALCIUM -D) 600-400 MG-UNIT TABS, Take 2 tablets by mouth at bedtime., Disp: , Rfl:    chlorhexidine  (HIBICLENS ) 4 % external liquid, Twice a day, rinse affected area water , THEN apply minimum amount of 4% solution necessary to cover skin or wound area and wash gently, THEN rinse water , Disp: 473 mL, Rfl: 0   Cholecalciferol 1.25 MG (50000 UT) capsule, Take 1 capsule every  week by oral route., Disp: , Rfl:    clobetasol cream (TEMOVATE) 0.05 %, Apply 1 application topically 2 (two) times daily as needed (skin irritation)., Disp: , Rfl:    diclofenac  Sodium (VOLTAREN ) 1 % GEL, APPLY 4 GRAMS TOPICALLY TO THE AFFECTED AREA FOUR TIMES DAILY AS NEEDED FOR PAIN, Disp: 300 g, Rfl: 0   fexofenadine  (ALLEGRA  ALLERGY) 180 MG tablet, Take 1 tablet (180 mg total) by mouth daily., Disp: 90 tablet, Rfl: 0   fluticasone  (FLONASE ) 50 MCG/ACT nasal spray, SPRAY 2 SPRAYS INTO EACH NOSTRIL EVERY DAY, Disp: 48 mL, Rfl: 1   ibuprofen (ADVIL) 600 MG tablet, Take 1 tablet (600 mg total) by mouth every 8 (eight) hours as needed for moderate pain (pain score 4-6)., Disp: 60 tablet, Rfl: 0   metoCLOPramide  (REGLAN ) 5 MG tablet, Take 1 tablet (5 mg total) by mouth every 8 (eight) hours as needed for nausea., Disp: 30 tablet, Rfl: 0   montelukast  (SINGULAIR ) 10 MG tablet, TAKE 1 TABLET BY MOUTH EVERYDAY AT BEDTIME, Disp: 90 tablet, Rfl: 2   mupirocin  ointment (BACTROBAN ) 2 %, Apply 1 Application topically 2 (two) times daily., Disp: 22 g, Rfl: 1   nystatin  (MYCOSTATIN ) 100000 UNIT/ML suspension, Take 5 mLs (500,000 Units total) by mouth 4 (four) times daily. Swish and swallow, Disp: 200 mL, Rfl: 0   ondansetron  (ZOFRAN -ODT) 4 MG disintegrating tablet, Take 1-2 tablets (4-8 mg total) by mouth every 8 (eight) hours as needed., Disp: 20 tablet, Rfl: 0  Semaglutide , 2 MG/DOSE, 8 MG/3ML SOPN, Inject 2 mg as directed once a week., Disp: 9 mL, Rfl: 0   tiZANidine  (ZANAFLEX ) 4 MG capsule, tizanidine  4 mg capsule, Disp: , Rfl:    triamcinolone  (KENALOG ) 0.1 % paste, Use as directed 1 Application in the mouth or throat 2 (two) times daily., Disp: 5 g, Rfl: 12   varenicline  (CHANTIX  CONTINUING MONTH PAK) 1 MG tablet, Take 1 tablet (1 mg total) by mouth 2 (two) times daily., Disp: 60 tablet, Rfl: 5   zolpidem  (AMBIEN ) 10 MG tablet, Take 1 tablet (10 mg total) by mouth at bedtime as needed. for sleep, Disp:  90 tablet, Rfl: 1  Current Facility-Administered Medications:    triamcinolone  acetonide (KENALOG ) 10 MG/ML injection 10 mg, 10 mg, Other, Once,    Medications ordered in this encounter:  Meds ordered this encounter  Medications   ketoconazole (NIZORAL) 2 % cream    Sig: Apply 1 Application topically 2 (two) times daily.    Dispense:  30 g    Refill:  0    Supervising Provider:   BLAISE ALEENE KIDD [[id number]]   terbinafine (LAMISIL) 250 MG tablet    Sig: Take 1 tablet (250 mg total) by mouth daily.    Dispense:  14 tablet    Refill:  0    Supervising Provider:   BLAISE ALEENE KIDD [[id number]]     *If you need refills on other medications prior to your next appointment, please contact your pharmacy*  Follow-Up: Call back or seek an in-person evaluation if the symptoms worsen or if the condition fails to improve as anticipated.  [facility] [phone]  Other Instructions  Body Ringworm Body ringworm is an infection of the skin that often causes a ring-shaped rash. Body ringworm is also called tinea corporis. Body ringworm can affect any part of your skin. This condition is easily spread from person to person (is very contagious). What are the causes? This condition is caused by fungi called dermatophytes. The condition develops when these fungi grow out of control on the skin. You can get this condition if you touch a person or animal that has it. You can also get it if you share any items with an infected person or pet. These include: Clothing, bedding, and towels. Brushes or combs. Gym equipment. Any other object that has the fungus on it. What increases the risk? You are more likely to develop this condition if you: Play sports that involve close physical contact, such as wrestling. Sweat a lot. Live in areas that are hot and humid. Use public showers. Have a weakened disease-fighting system (immune system). What are the signs or symptoms? Symptoms of this  condition include: Itchy, raised red spots and bumps. Red scaly patches. A ring-shaped rash. The rash may have: A clear center. Scales or red bumps at its center. Redness near its borders. Dry and scaly skin on or around it. How is this diagnosed? This condition can usually be diagnosed with a skin exam. A skin scraping may be taken from the affected area and examined under a microscope to see if the fungus is present. How is this treated? This condition may be treated with: An antifungal cream or ointment. An antifungal shampoo. Antifungal medicines. These may be prescribed if your ringworm: Is severe. Keeps coming back or lasts a long time. Follow these instructions at home: Take over-the-counter and prescription medicines only as told by your health care provider. If you were given an antifungal  cream or ointment: Use it as told by your health care provider. Wash the infected area and dry it completely before applying the cream or ointment. If you were given an antifungal shampoo: Use it as told by your health care provider. Leave the shampoo on your body for 3-5 minutes before rinsing. While you have a rash: Wear loose clothing to stop clothes from rubbing and irritating it. Wash or change your bed sheets every night. Wash clothes and bed sheets in hot water . Disinfect or throw out items that may be infected. Wash your hands often with soap and water  for at least 20 seconds. If soap and water  are not available, use hand sanitizer. If your pet has the same infection, take your pet to see a veterinarian for treatment. How is this prevented? Take a bath or shower every day and after every time you work out or play sports. Dry your skin completely after bathing. Wear sandals or shoes in public places and showers. Wash athletic clothes after each use. Do not share personal items with others. Avoid touching red patches of skin on other people. Avoid touching pets that have bald  spots. If you touch an animal that has a bald spot, wash your hands. Contact a health care provider if: Your rash continues to spread after 7 days of treatment. Your rash is not gone in 4 weeks. The area around your rash gets red, warm, tender, and swollen. This information is not intended to replace advice given to you by your health care provider. Make sure you discuss any questions you have with your health care provider. Document Revised: 05/17/2022 Document Reviewed: 05/17/2022 Elsevier Patient Education  2024 [employer]   If you have been instructed to have an in-person evaluation today at a local Urgent Care facility, please use the link below. It will take you to a list of all of our available [facility], including address, phone number and hours of operation. Please do not delay care.  [facility]  If you or a family member do not have a primary care provider, use the link below to schedule a visit and establish care. When you choose a [facility] primary care physician or advanced practice provider, you gain a long-term partner in health. Find a Primary Care Provider  Learn more about [facility]'s in-office and virtual care options: [facility] - Get Care Now

## 2024-09-21 NOTE — Progress Notes (Signed)
 Virtual Visit Consent   Robin Small, you are scheduled for a virtual visit with a [facility] provider today. Just as with appointments in the office, your consent must be obtained to participate. Your consent will be active for this visit and any virtual visit you may have with one of our providers in the next 365 days. If you have a MyChart account, a copy of this consent can be sent to you electronically.  As this is a virtual visit, video technology does not allow for your provider to perform a traditional examination. This may limit your provider's ability to fully assess your condition. If your provider identifies any concerns that need to be evaluated in person or the need to arrange testing (such as labs, EKG, etc.), we will make arrangements to do so. Although advances in technology are sophisticated, we cannot ensure that it will always work on either your end or our end. If the connection with a video visit is poor, the visit may have to be switched to a telephone visit. With either a video or telephone visit, we are not always able to ensure that we have a secure connection.  By engaging in this virtual visit, you consent to the provision of healthcare and authorize for your insurance to be billed (if applicable) for the services provided during this visit. Depending on your insurance coverage, you may receive a charge related to this service.  I need to obtain your verbal consent now. Are you willing to proceed with your visit today? Robin Small has provided verbal consent on 09/21/2024 for a virtual visit (video or telephone). Robin CHRISTELLA Dickinson, Robin Small  Date: 09/21/2024 10:57 AM   Virtual Visit via Video Note   I, Robin Small, connected with  Robin Small  ([id number], 16-Aug-1961) on 09/21/24 at 10:45 AM EDT by a video-enabled telemedicine application and verified that I am speaking with the correct person using two identifiers.  Location: Patient: Virtual Visit Location  Patient: Home Provider: Virtual Visit Location Provider: Home Office   I discussed the limitations of evaluation and management by telemedicine and the availability of in person appointments. The patient expressed understanding and agreed to proceed.    History of Present Illness: Robin Small is a 63 y.o. who identifies as a female who was assigned female at birth, and is being seen today for rash, possible ringworm.  HPI: Rash This is a new problem. The current episode started 1 to 4 weeks ago (couple of weeks). The affected locations include the left arm, back and left elbow. The rash is characterized by redness, scaling and itchiness. It is unknown if there was an exposure to a precipitant. Pertinent negatives include no facial edema, fever or shortness of breath. Treatments tried: tinactin, lotrimin . The treatment provided no relief.     Problems:  Patient Active Problem List   Diagnosis Date Noted   Coronary artery calcification seen on CAT scan 06/01/2024   Panlobular emphysema (HCC) 05/08/2024   Folliculitis of axilla 05/08/2024   Multinodular goiter 04/28/2024   Left foot pain 02/04/2024   Thyroid  nodule 02/04/2024   Aortic atherosclerosis 02/04/2024   Chronic midline low back pain with left-sided sciatica 09/27/2023   Extrinsic asthma 09/27/2023   Type 2 diabetes mellitus with neurological complications (HCC) 09/27/2023   Type 2 diabetes mellitus without complication, without long-term current use of insulin (HCC) 05/27/2023   Chronic pain of left ankle 05/27/2023   Hyperlipidemia associated with type 2 diabetes mellitus (  HCC) 03/01/2023   Chronic pain of both knees 12/29/2022   Tobacco abuse 12/29/2022   Hemorrhoids, internal 08/31/2022   Lichen sclerosus 05/24/2022   TMJ (temporomandibular joint syndrome) 05/24/2022   Seasonal allergic rhinitis due to pollen 05/24/2022   Class 1 obesity with serious comorbidity and body mass index (BMI) of 33.0 to 33.9 in adult  05/24/2022   Sleep disturbance 05/24/2022   Primary osteoarthritis of right knee 09/15/2019   Primary osteoarthritis of left knee 03/03/2019   Seasonal and perennial allergic rhinitis 02/13/2019   S/P hemorrhoidectomy 02/01/2017   Post-operative nausea and vomiting 01/24/2017   Sinusitis, acute 08/24/2013   Dyspnea 01/08/2013   Sleep apnea 01/08/2013   Muscle ache 02/08/2012   History of colonic polyps 04/06/2011   External hemorrhoids 12/27/2010   Mild intermittent asthma 06/03/2010   LUMBAR SPRAIN AND STRAIN 07/20/2008   ANXIETY DEPRESSION 06/28/2008   Dyslipidemia 07/10/2007    Allergies:  Allergies  Allergen Reactions   Chantix  [Varenicline ] Nausea Only   Codeine Nausea Only   Medications:  Current Outpatient Medications:    ketoconazole (NIZORAL) 2 % cream, Apply 1 Application topically 2 (two) times daily., Disp: 30 g, Rfl: 0   terbinafine (LAMISIL) 250 MG tablet, Take 1 tablet (250 mg total) by mouth daily., Disp: 14 tablet, Rfl: 0   acetaminophen  (TYLENOL ) 500 MG tablet, Take 1,000 mg by mouth every 6 (six) hours as needed for moderate pain., Disp: , Rfl:    albuterol  (VENTOLIN  HFA) 108 (90 Base) MCG/ACT inhaler, Inhale 1-2 puffs into the lungs every 6 (six) hours as needed., Disp: 8 g, Rfl: 0   atorvastatin  (LIPITOR) 20 MG tablet, TAKE 1 TABLET BY MOUTH EVERY DAY, Disp: 90 tablet, Rfl: 3   Azelastine  HCl 137 MCG/SPRAY SOLN, SPRAY ONCE IN EACH NOSTRIL TWICE DAILY AS DIRECTED, Disp: 30 mL, Rfl: 11   brompheniramine-pseudoephedrine -DM 30-2-10 MG/5ML syrup, Take 5 mLs by mouth 4 (four) times daily as needed., Disp: 120 mL, Rfl: 0   Calcium  Carbonate-Vitamin D (CALCIUM -D) 600-400 MG-UNIT TABS, Take 2 tablets by mouth at bedtime., Disp: , Rfl:    chlorhexidine  (HIBICLENS ) 4 % external liquid, Twice a day, rinse affected area water , THEN apply minimum amount of 4% solution necessary to cover skin or wound area and wash gently, THEN rinse water , Disp: 473 mL, Rfl: 0    Cholecalciferol 1.25 MG (50000 UT) capsule, Take 1 capsule every week by oral route., Disp: , Rfl:    clobetasol cream (TEMOVATE) 0.05 %, Apply 1 application topically 2 (two) times daily as needed (skin irritation)., Disp: , Rfl:    diclofenac  Sodium (VOLTAREN ) 1 % GEL, APPLY 4 GRAMS TOPICALLY TO THE AFFECTED AREA FOUR TIMES DAILY AS NEEDED FOR PAIN, Disp: 300 g, Rfl: 0   fexofenadine  (ALLEGRA  ALLERGY) 180 MG tablet, Take 1 tablet (180 mg total) by mouth daily., Disp: 90 tablet, Rfl: 0   fluticasone  (FLONASE ) 50 MCG/ACT nasal spray, SPRAY 2 SPRAYS INTO EACH NOSTRIL EVERY DAY, Disp: 48 mL, Rfl: 1   ibuprofen (ADVIL) 600 MG tablet, Take 1 tablet (600 mg total) by mouth every 8 (eight) hours as needed for moderate pain (pain score 4-6)., Disp: 60 tablet, Rfl: 0   metoCLOPramide  (REGLAN ) 5 MG tablet, Take 1 tablet (5 mg total) by mouth every 8 (eight) hours as needed for nausea., Disp: 30 tablet, Rfl: 0   montelukast  (SINGULAIR ) 10 MG tablet, TAKE 1 TABLET BY MOUTH EVERYDAY AT BEDTIME, Disp: 90 tablet, Rfl: 2   mupirocin  ointment (BACTROBAN ) 2 %,  Apply 1 Application topically 2 (two) times daily., Disp: 22 g, Rfl: 1   nystatin  (MYCOSTATIN ) 100000 UNIT/ML suspension, Take 5 mLs (500,000 Units total) by mouth 4 (four) times daily. Swish and swallow, Disp: 200 mL, Rfl: 0   ondansetron  (ZOFRAN -ODT) 4 MG disintegrating tablet, Take 1-2 tablets (4-8 mg total) by mouth every 8 (eight) hours as needed., Disp: 20 tablet, Rfl: 0   Semaglutide , 2 MG/DOSE, 8 MG/3ML SOPN, Inject 2 mg as directed once a week., Disp: 9 mL, Rfl: 0   tiZANidine  (ZANAFLEX ) 4 MG capsule, tizanidine  4 mg capsule, Disp: , Rfl:    triamcinolone  (KENALOG ) 0.1 % paste, Use as directed 1 Application in the mouth or throat 2 (two) times daily., Disp: 5 g, Rfl: 12   varenicline  (CHANTIX  CONTINUING MONTH PAK) 1 MG tablet, Take 1 tablet (1 mg total) by mouth 2 (two) times daily., Disp: 60 tablet, Rfl: 5   zolpidem  (AMBIEN ) 10 MG tablet, Take 1  tablet (10 mg total) by mouth at bedtime as needed. for sleep, Disp: 90 tablet, Rfl: 1  Current Facility-Administered Medications:    triamcinolone  acetonide (KENALOG ) 10 MG/ML injection 10 mg, 10 mg, Other, Once,   Observations/Objective: Patient is well-developed, well-nourished in no acute distress.  Resting comfortably at home.  Head is normocephalic, atraumatic.  No labored breathing.  Speech is clear and coherent with logical content.  Patient is alert and oriented at baseline.         Assessment and Plan: 1. Ringworm of body (Primary) - ketoconazole (NIZORAL) 2 % cream; Apply 1 Application topically 2 (two) times daily.  Dispense: 30 g; Refill: 0 - terbinafine (LAMISIL) 250 MG tablet; Take 1 tablet (250 mg total) by mouth daily.  Dispense: 14 tablet; Refill: 0  - Not improving with OTC topical treatments - Added Ketoconazole cream to apply twice daily for 7-14 days - Terbinafine 250mg  Take once daily for 14 days - Avoid picking and scratching - Seek in person evaluation if worsening or fails to resolve  Follow Up Instructions: I discussed the assessment and treatment plan with the patient. The patient was provided an opportunity to ask questions and all were answered. The patient agreed with the plan and demonstrated an understanding of the instructions.  A copy of instructions were sent to the patient via MyChart unless otherwise noted below.    The patient was advised to call back or seek an in-person evaluation if the symptoms worsen or if the condition fails to improve as anticipated.    Robin CHRISTELLA Dickinson, Robin Small

## 2024-09-21 NOTE — Telephone Encounter (Signed)
 Patient gets gabapentin from a different provider.  Recommend she reach out to them.

## 2024-09-22 NOTE — Telephone Encounter (Signed)
 Spoke to patient and relay message below. Pt verbalized understanding and stated Rx was refilled by prescriber.

## 2024-10-07 ENCOUNTER — Encounter

## 2024-10-07 ENCOUNTER — Ambulatory Visit: Admitting: Pulmonary Disease

## 2024-10-08 ENCOUNTER — Ambulatory Visit (INDEPENDENT_AMBULATORY_CARE_PROVIDER_SITE_OTHER): Admitting: Family Medicine

## 2024-10-08 VITALS — BP 98/63 | HR 79 | Temp 97.0°F | Resp 18 | Wt 172.4 lb

## 2024-10-08 DIAGNOSIS — G8929 Other chronic pain: Secondary | ICD-10-CM

## 2024-10-08 DIAGNOSIS — R21 Rash and other nonspecific skin eruption: Secondary | ICD-10-CM

## 2024-10-08 DIAGNOSIS — B354 Tinea corporis: Secondary | ICD-10-CM | POA: Diagnosis not present

## 2024-10-08 DIAGNOSIS — M5442 Lumbago with sciatica, left side: Secondary | ICD-10-CM | POA: Diagnosis not present

## 2024-10-08 MED ORDER — FLUCONAZOLE 150 MG PO TABS: 150.0000 mg | ORAL_TABLET | ORAL | 0 refills | Status: AC

## 2024-10-08 MED ORDER — KETOCONAZOLE 2 % EX CREA: 1.0000 | TOPICAL_CREAM | Freq: Two times a day (BID) | CUTANEOUS | 0 refills | Status: AC

## 2024-10-08 NOTE — Patient Instructions (Addendum)
  VISIT SUMMARY: You came in today for a rash on your arms and chronic back pain. We discussed your current treatments and made some adjustments to help improve your symptoms.  YOUR PLAN: TINEA CORPORIS (RINGWORM): You have a fungal infection on your left arm and upper back that has been slightly improving with your current treatment but is still not fully resolved. -We will perform a fungal culture swab to better understand the infection. -You will start taking oral fluconazole  150 mg once a week for two weeks. -Continue using ketoconazole 2% cream on the affected areas twice a day. -Schedule a follow-up appointment in 2-4 weeks to reassess the rash. -If the rash does not improve, we may refer you to a dermatologist.  CHRONIC BACK PAIN: You have chronic low back pain that affects your mobility and you have recently been awarded social care disability for this condition. -We will schedule a follow-up appointment to discuss your chronic pain and disability claims further. -It is recommended that you discuss ongoing management with your neurosurgeon.

## 2024-10-08 NOTE — Progress Notes (Signed)
 Assessment & Plan   Assessment/Plan:    Assessment & Plan Tinea corporis (ringworm) of left arm and upper back Tinea corporis with annular lesions on the left arm and upper back, slightly improved with current treatment. The rash is circular and raised, consistent with tinea corporis. Previous treatment included ketoconazole cream and oral terbinafine. The condition is chronic, having persisted for about two months, and is very itchy. The oral antifungal was initially prescribed for a short duration due to potential liver toxicity, serving as a kick starter for treatment. - Perform fungal culture swab for rash on left upper arm and upper back - Prescribe oral fluconazole  150 mg once a week for two weeks - Continue ketoconazole 2% cream topically twice a day - Schedule follow-up in 2-4 weeks to reassess rash - Consider referral to dermatology if rash does not improve  Chronic low back pain with immobility Chronic low back pain with associated immobility. Recently awarded social care disability. Requires further discussion and management planning with her neurosurgeon. - Schedule follow-up appointment to discuss chronic pain and disability claims - Recommend discussing ongoing management with neurosurgeon      Medications Discontinued During This Encounter  Medication Reason   terbinafine (LAMISIL) 250 MG tablet    ketoconazole (NIZORAL) 2 % cream Reorder    Return in about 2 weeks (around 10/22/2024) for rash, disability.        Subjective:   Encounter date: 10/08/2024  Robin Small is a 63 y.o. female who has Dyslipidemia; ANXIETY DEPRESSION; Mild intermittent asthma; LUMBAR SPRAIN AND STRAIN; External hemorrhoids; Muscle ache; Dyspnea; Sleep apnea; Sinusitis, acute; History of colonic polyps; Seasonal and perennial allergic rhinitis; Primary osteoarthritis of left knee; Primary osteoarthritis of right knee; Lichen sclerosus; S/P hemorrhoidectomy; TMJ (temporomandibular  joint syndrome); Seasonal allergic rhinitis due to pollen; Class 1 obesity with serious comorbidity and body mass index (BMI) of 33.0 to 33.9 in adult; Sleep disturbance; Hemorrhoids, internal; Chronic pain of both knees; Tobacco abuse; Hyperlipidemia associated with type 2 diabetes mellitus (HCC); Post-operative nausea and vomiting; Type 2 diabetes mellitus without complication, without long-term current use of insulin (HCC); Chronic pain of left ankle; Chronic midline low back pain with left-sided sciatica; Extrinsic asthma; Type 2 diabetes mellitus with neurological complications (HCC); Left foot pain; Thyroid  nodule; Aortic atherosclerosis; Multinodular goiter; Panlobular emphysema (HCC); Folliculitis of axilla; and Coronary artery calcification seen on CAT scan on their problem list..   She  has a past medical history of Allergy, Anxiety, Asthma, Depression, Diverticulosis (03/2016), High cholesterol, History of kidney stones, Lichen sclerosus, Low blood pressure reading, OA (osteoarthritis), OSA on CPAP, Panlobular emphysema (HCC) (05/08/2024), Perianal cyst, Personal history of colonic polyps (04/06/2011), PONV (postoperative nausea and vomiting), PPD positive, Pre-diabetes, Seasonal allergies, Sleep apnea, Thyroid  nodule (02/04/2024), and Type 2 diabetes mellitus without complication, without long-term current use of insulin (HCC) (05/27/2023)..   She presents with chief complaint of ringworm  (Pt c/o of rash on left and right arm for 1 month; very itchy. Pt seen Chrystine [name], [place]) on 10/092025 VV and was prescribed RX cream and Terbinafine 250mg )) .   Discussed the use of AI scribe software for clinical note transcription with the patient, who gave verbal consent to proceed.  History of Present Illness Robin Small is a 63 year old female who presents with a rash on her arms.  Pruritic rash of the upper extremities - Pruritic rash present on bilateral arms for one month - Rash is  raised and circular in  appearance - Initially thought to be due to mosquito bites - Located on the back of both arms - Ketoconazole 2% cream applied twice daily with slight improvement - Completed a two-week course of terbinafine 250 mg with partial response - Rash remains incompletely resolved  Chronic back pain - Chronic back pain present - Recently awarded social care disability for this condition - Requesting assistance with disability documentation     ROS  Past Surgical History:  Procedure Laterality Date   ARTHROSCOPIC KNEE Left    CESAREAN SECTION     COLONOSCOPY W/ POLYPECTOMY  04/11/2016   ENDOMETRIAL ABLATION     GANGLION CYST EXCISION Right    HEMORROIDECTOMY     JOINT REPLACEMENT  both knees 2020   lichen sclerosus lesion excision     Anus   TOTAL KNEE ARTHROPLASTY Left 03/03/2019   Procedure: TOTAL KNEE ARTHROPLASTY;  Surgeon: Sheril Coy, MD;  Location: [facility];  Service: Orthopedics;  Laterality: Left;   TOTAL KNEE ARTHROPLASTY Right 09/15/2019   Procedure: RIGHT TOTAL KNEE ARTHROPLASTY;  Surgeon: Sheril Coy, MD;  Location: [facility];  Service: Orthopedics;  Laterality: Right;   TUBAL LIGATION  2001    Outpatient Medications Prior to Visit  Medication Sig Dispense Refill   acetaminophen  (TYLENOL ) 500 MG tablet Take 1,000 mg by mouth every 6 (six) hours as needed for moderate pain.     albuterol  (VENTOLIN  HFA) 108 (90 Base) MCG/ACT inhaler Inhale 1-2 puffs into the lungs every 6 (six) hours as needed. 8 g 0   atorvastatin  (LIPITOR) 20 MG tablet TAKE 1 TABLET BY MOUTH EVERY DAY 90 tablet 3   Azelastine  HCl 137 MCG/SPRAY SOLN SPRAY ONCE IN EACH NOSTRIL TWICE DAILY AS DIRECTED 30 mL 11   brompheniramine-pseudoephedrine -DM 30-2-10 MG/5ML syrup Take 5 mLs by mouth 4 (four) times daily as needed. 120 mL 0   Calcium  Carbonate-Vitamin D (CALCIUM -D) 600-400 MG-UNIT TABS Take 2 tablets by mouth at bedtime.     chlorhexidine  (HIBICLENS ) 4 % external liquid Twice a  day, rinse affected area water , THEN apply minimum amount of 4% solution necessary to cover skin or wound area and wash gently, THEN rinse water  473 mL 0   Cholecalciferol 1.25 MG (50000 UT) capsule Take 1 capsule every week by oral route.     clobetasol cream (TEMOVATE) 0.05 % Apply 1 application topically 2 (two) times daily as needed (skin irritation).     diclofenac  Sodium (VOLTAREN ) 1 % GEL APPLY 4 GRAMS TOPICALLY TO THE AFFECTED AREA FOUR TIMES DAILY AS NEEDED FOR PAIN 300 g 0   fexofenadine  (ALLEGRA  ALLERGY) 180 MG tablet Take 1 tablet (180 mg total) by mouth daily. 90 tablet 0   fluticasone  (FLONASE ) 50 MCG/ACT nasal spray SPRAY 2 SPRAYS INTO EACH NOSTRIL EVERY DAY 48 mL 1   ibuprofen (ADVIL) 600 MG tablet Take 1 tablet (600 mg total) by mouth every 8 (eight) hours as needed for moderate pain (pain score 4-6). 60 tablet 0   montelukast  (SINGULAIR ) 10 MG tablet TAKE 1 TABLET BY MOUTH EVERYDAY AT BEDTIME 90 tablet 2   mupirocin  ointment (BACTROBAN ) 2 % Apply 1 Application topically 2 (two) times daily. 22 g 1   nystatin  (MYCOSTATIN ) 100000 UNIT/ML suspension Take 5 mLs (500,000 Units total) by mouth 4 (four) times daily. Swish and swallow 200 mL 0   ondansetron  (ZOFRAN -ODT) 4 MG disintegrating tablet Take 1-2 tablets (4-8 mg total) by mouth every 8 (eight) hours as needed. 20 tablet 0   Semaglutide , 2 MG/DOSE,  8 MG/3ML SOPN Inject 2 mg as directed once a week. 9 mL 0   tiZANidine  (ZANAFLEX ) 4 MG capsule tizanidine  4 mg capsule     triamcinolone  (KENALOG ) 0.1 % paste Use as directed 1 Application in the mouth or throat 2 (two) times daily. 5 g 12   varenicline  (CHANTIX  CONTINUING MONTH PAK) 1 MG tablet Take 1 tablet (1 mg total) by mouth 2 (two) times daily. 60 tablet 5   zolpidem  (AMBIEN ) 10 MG tablet Take 1 tablet (10 mg total) by mouth at bedtime as needed. for sleep 90 tablet 1   ketoconazole (NIZORAL) 2 % cream Apply 1 Application topically 2 (two) times daily. 30 g 0   metoCLOPramide   (REGLAN ) 5 MG tablet Take 1 tablet (5 mg total) by mouth every 8 (eight) hours as needed for nausea. 30 tablet 0   terbinafine (LAMISIL) 250 MG tablet Take 1 tablet (250 mg total) by mouth daily. (Patient not taking: Reported on 10/08/2024) 14 tablet 0   Facility-Administered Medications Prior to Visit  Medication Dose Route Frequency Provider Last Rate Last Admin   triamcinolone  acetonide (KENALOG ) 10 MG/ML injection 10 mg  10 mg Other Once         Family History  Problem Relation Age of Onset   Colon polyps Mother    Diabetes Mother    Hypertension Mother    Heart disease Mother    Hyperlipidemia Mother    Arthritis Mother    Kidney disease Mother    Stroke Mother    Heart attack Mother        2018 heart failure   Lung cancer Father    Varicose Veins Sister    Alcohol abuse Brother    Hepatitis Brother        hep c   Early death Brother    Drug abuse Brother    Breast cancer Maternal Grandmother        breast   Colon cancer Neg Hx    Esophageal cancer Neg Hx    Rectal cancer Neg Hx    Stomach cancer Neg Hx     Social History   Socioeconomic History   Marital status: Married    Spouse name: Not on file   Number of children: Not on file   Years of education: Not on file   Highest education level: Associate degree: academic program  Occupational History   Occupation: internet support  Tobacco Use   Smoking status: Former    Current packs/day: 0.00    Average packs/day: 0.5 packs/day for 30.0 years (15.0 ttl pk-yrs)    Types: Cigarettes    Start date: 07/30/1989    Quit date: 07/31/2019    Years since quitting: 5.1    Passive exposure: Never   Smokeless tobacco: Never   Tobacco comments:    1 pack per week on and off        Pt stop smoking 1 month ago (August 2025)   Vaping Use   Vaping status: Never Used  Substance and Sexual Activity   Alcohol use: Not Currently    Comment: Maybe once a month   Drug use: No   Sexual activity: Yes    Partners: Male     Birth control/protection: Post-menopausal, Surgical  Other Topics Concern   Not on file  Social History Narrative   Exercise-- daily for 1 hour   Social Drivers of Health   Financial Resource Strain: High Risk (10/08/2024)   Overall Financial Resource Strain (CARDIA)  Difficulty of Paying Living Expenses: Very hard  Food Insecurity: Food Insecurity Present (10/08/2024)   Hunger Vital Sign    Worried About Running Out of Food in the Last Year: Sometimes true    Ran Out of Food in the Last Year: Sometimes true  Transportation Needs: Unmet Transportation Needs (10/08/2024)   PRAPARE - Administrator, Civil Service (Medical): Yes    Lack of Transportation (Non-Medical): Yes  Physical Activity: Sufficiently Active (10/08/2024)   Exercise Vital Sign    Days of Exercise per Week: 5 days    Minutes of Exercise per Session: 30 min  Stress: Stress Concern Present (10/08/2024)   [employer] of Occupational Health - Occupational Stress Questionnaire    Feeling of Stress: Very much  Social Connections: Moderately Integrated (10/08/2024)   Social Connection and Isolation Panel    Frequency of Communication with Friends and Family: More than three times a week    Frequency of Social Gatherings with Friends and Family: Once a week    Attends Religious Services: Never    Database administrator or Organizations: Yes    Attends Engineer, structural: More than 4 times per year    Marital Status: Married  Catering manager Violence: Not on file                                                                                                  Objective:  Physical Exam: BP 98/63 (BP Location: Right Arm, Patient Position: Sitting, Cuff Size: Large)   Pulse 79   Temp (!) 97 F (36.1 C) (Temporal)   Resp 18   Wt 172 lb 6.4 oz (78.2 kg)   SpO2 99%   BMI 32.57 kg/m    Physical Exam GENERAL: Alert, cooperative, well developed, no acute distress. HEENT: Normocephalic,  normal oropharynx, moist mucous membranes. CHEST: Clear to auscultation bilaterally, no wheezes, rhonchi, or crackles. CARDIOVASCULAR: Normal heart rate and rhythm, S1 and S2 normal without murmurs. ABDOMEN: Soft, non-tender, non-distended, without organomegaly, normal bowel sounds. EXTREMITIES: No cyanosis or edema. NEUROLOGICAL: Cranial nerves grossly intact, moves all extremities without gross motor or sensory deficit. SKIN: Annular lesions with some clearing on left upper arm and upper back.   Physical Exam  No results found.  Recent Results (from the past 2160 hours)  POCT glycosylated hemoglobin (Hb A1C)     Status: Abnormal   Collection Time: 08/21/24  9:50 AM  Result Value Ref Range   Hemoglobin A1C 5.8 (A) 4.0 - 5.6 %   HbA1c POC (<> result, manual entry)     HbA1c, POC (prediabetic range)     HbA1c, POC (controlled diabetic range)          Beverley Adine Hummer, MD, MS

## 2024-10-11 ENCOUNTER — Other Ambulatory Visit: Payer: Self-pay | Admitting: Family Medicine

## 2024-10-11 DIAGNOSIS — J301 Allergic rhinitis due to pollen: Secondary | ICD-10-CM

## 2024-10-15 ENCOUNTER — Ambulatory Visit (INDEPENDENT_AMBULATORY_CARE_PROVIDER_SITE_OTHER): Admitting: Podiatry

## 2024-10-15 ENCOUNTER — Encounter: Payer: Self-pay | Admitting: Podiatry

## 2024-10-15 VITALS — Ht 61.0 in | Wt 172.4 lb

## 2024-10-15 DIAGNOSIS — B351 Tinea unguium: Secondary | ICD-10-CM

## 2024-10-15 DIAGNOSIS — M19072 Primary osteoarthritis, left ankle and foot: Secondary | ICD-10-CM

## 2024-10-15 DIAGNOSIS — M79675 Pain in left toe(s): Secondary | ICD-10-CM | POA: Diagnosis not present

## 2024-10-15 DIAGNOSIS — M79674 Pain in right toe(s): Secondary | ICD-10-CM

## 2024-10-15 MED ORDER — BETAMETHASONE SOD PHOS & ACET 6 (3-3) MG/ML IJ SUSP
12.0000 mg | Freq: Once | INTRAMUSCULAR | Status: AC
  Administered 2024-10-15: 12 mg via INTRAMUSCULAR

## 2024-10-15 MED ORDER — CICLOPIROX 8 % EX SOLN: Freq: Every day | CUTANEOUS | 12 refills | Status: AC

## 2024-10-15 MED ORDER — MELOXICAM 15 MG PO TABS: 15.0000 mg | ORAL_TABLET | Freq: Every day | ORAL | 0 refills | Status: DC

## 2024-10-15 NOTE — Progress Notes (Unsigned)
 Chief Complaint  Patient presents with   Foot Pain    Pt is here to f/u on left foot due to pain, states she has been having foot issues for 2 years, requesting injection to the foot, states it the only thing that helps.    HPI: 63 y.o. female presents today following up for left subtalar joint arthritis.  Has been using inserts for shoes.  Also had injection last visit which did provide pretty good relief for several months.  She is currently having low back pain worked up as well, believe this is contributing to shooting pain that she experiences going up to the ankle and leg.  Does have history of osteoarthritis and prior knee replacement.  She is also asking about treatments for fungal toenails noting that they are thickened, dystrophic and difficult for her to maintain.  Past Medical History:  Diagnosis Date   Allergy    Anxiety    Asthma    Seasonal allergies/ mild emphysema   Depression    Diverticulosis 03/2016   Mild, noted on colonoscopy   High cholesterol    History of kidney stones    Lichen sclerosus    Anus   Low blood pressure reading    OA (osteoarthritis)    OSA on CPAP    Panlobular emphysema (HCC) 05/08/2024   Perianal cyst    Personal history of colonic polyps 04/06/2011   PONV (postoperative nausea and vomiting)    after c section, did well after knee replacement   PPD positive    Age 46 >> reports taking medicine for one year   Pre-diabetes    per pt report   Seasonal allergies    Sleep apnea    Thyroid  nodule 02/04/2024   Type 2 diabetes mellitus without complication, without long-term current use of insulin (HCC) 05/27/2023    Past Surgical History:  Procedure Laterality Date   ARTHROSCOPIC KNEE Left    CESAREAN SECTION     COLONOSCOPY W/ POLYPECTOMY  04/11/2016   ENDOMETRIAL ABLATION     GANGLION CYST EXCISION Right    HEMORROIDECTOMY     JOINT REPLACEMENT  both knees 2020   lichen sclerosus lesion excision     Anus   TOTAL KNEE  ARTHROPLASTY Left 03/03/2019   Procedure: TOTAL KNEE ARTHROPLASTY;  Surgeon: Sheril Coy, MD;  Location: [facility];  Service: Orthopedics;  Laterality: Left;   TOTAL KNEE ARTHROPLASTY Right 09/15/2019   Procedure: RIGHT TOTAL KNEE ARTHROPLASTY;  Surgeon: Sheril Coy, MD;  Location: [facility];  Service: Orthopedics;  Laterality: Right;   TUBAL LIGATION  2001    Allergies  Allergen Reactions   Chantix  [Varenicline ] Nausea Only   Codeine Nausea Only    ROS    Physical Exam: There were no vitals filed for this visit.  General: The patient is alert and oriented x3 in no acute distress.  Dermatology: Skin is warm, dry and supple bilateral lower extremities. Interspaces are clear of maceration and debris.  Nail plates x 10 are thickened, dystrophic with yellow discoloration and subungual debris.  Appearance consistent with mycotic infection.  They are tender on direct dorsal palpation.  Vascular: Palpable pedal pulses bilaterally. Capillary refill within normal limits. No erythema or calor.  Neurological: Light touch sensation grossly intact bilateral feet.   Musculoskeletal Exam: Left foot greater than right foot pes planus foot type.  There is tenderness on palpation of left sinus tarsi and of subtalar joint.  Decrease subtalar joint  inversion eversion versus right side.  No significant pain with ankle joint range of motion.  Does ambulate with cane assistance.  Radiographic Exam: Left foot 3 views weightbearing 06/11/2024 Normal osseous mineralization.  Subtalar joint arthritis appreciated.  Pes planus foot type noted.  Some mild dorsal TN spurring present with some arthritis appreciated to lesser 2nd and 3rd TMT J's.  Assessment/Plan of Care: 1. Arthritis of left subtalar joint   2. Pain due to onychomycosis of toenails of both feet      Meds ordered this encounter  Medications   betamethasone  acetate-betamethasone  sodium phosphate  (CELESTONE ) injection 12 mg   meloxicam (MOBIC)  15 MG tablet    Sig: Take 1 tablet (15 mg total) by mouth daily.    Dispense:  30 tablet    Refill:  0   ciclopirox (PENLAC) 8 % solution    Sig: Apply topically at bedtime. Apply over nail and surrounding skin. Apply daily over previous coat. After seven (7) days, may remove with alcohol and continue cycle.    Dispense:  6.6 mL    Refill:  12   None  Discussed clinical findings with patient today.  # Left subtalar joint arthritis Reviewed findings of subtalar joint arthritis with patient.  Continue use of good supportive shoe gear and over-the-counter inserts.  Course of oral meloxicam sent to patient's pharmacy.  Verbal consent obtained to administer corticosteroid injection to the left subtalar joint.  Betadine  skin prep.  2 cc of a one-to-one ratio of 0.5% Marcaine  plain mixed with betamethasone  soluspan. patient tolerated this well.  Band-Aid applied.  Did discuss goal of treatment to manage pain and to maintain level of function.  Did discuss that arthritis would not be reversible without surgery.  Recommend conservative management at this time.  Follow-up as needed for this issue  # Pain due to onychomycosis of toenails - Findings discussed with patient - Obtain nail specimen from right first toenail to be sent off for fungal culture - Prescription for topical ciclopirox sent to patient's pharmacy.  Discussed proper application with patient - Apply at bedtime nightly, wash off with rubbing alcohol or nail polish remover once a week - Anticipate prolonged treatment course in 6 months to a year based on extent of patient's nail changes -Follow-up in 3 months for onychomycosis med check and nail debridement -I certify that this diagnosis represents a distinct and separate diagnosis that requires evaluation and treatment separate from other procedures or diagnosis      Renn Dirocco L. Lamount MAUL, AACFAS [facility]     [address] [place], [place] [address]                Office [phone]  Fax [phone]

## 2024-10-15 NOTE — Patient Instructions (Signed)
 Look for urea 40% nail gel cream or ointment and apply to the thickened toenails. This can be bought over the counter, at a pharmacy or online such as [employer].

## 2024-10-21 ENCOUNTER — Ambulatory Visit (HOSPITAL_BASED_OUTPATIENT_CLINIC_OR_DEPARTMENT_OTHER): Attending: Pulmonary Disease | Admitting: Pulmonary Disease

## 2024-10-21 DIAGNOSIS — G4733 Obstructive sleep apnea (adult) (pediatric): Secondary | ICD-10-CM | POA: Diagnosis not present

## 2024-10-21 DIAGNOSIS — G4761 Periodic limb movement disorder: Secondary | ICD-10-CM | POA: Insufficient documentation

## 2024-10-23 ENCOUNTER — Other Ambulatory Visit: Payer: Self-pay | Admitting: Family Medicine

## 2024-10-23 DIAGNOSIS — E119 Type 2 diabetes mellitus without complications: Secondary | ICD-10-CM

## 2024-10-27 ENCOUNTER — Ambulatory Visit: Admitting: Family Medicine

## 2024-10-27 ENCOUNTER — Encounter: Payer: Self-pay | Admitting: Family Medicine

## 2024-10-27 VITALS — BP 113/70 | HR 60 | Temp 97.0°F | Resp 18 | Wt 171.4 lb

## 2024-10-27 DIAGNOSIS — M25572 Pain in left ankle and joints of left foot: Secondary | ICD-10-CM | POA: Diagnosis not present

## 2024-10-27 DIAGNOSIS — R21 Rash and other nonspecific skin eruption: Secondary | ICD-10-CM

## 2024-10-27 DIAGNOSIS — E1149 Type 2 diabetes mellitus with other diabetic neurological complication: Secondary | ICD-10-CM

## 2024-10-27 DIAGNOSIS — Z7985 Long-term (current) use of injectable non-insulin antidiabetic drugs: Secondary | ICD-10-CM

## 2024-10-27 DIAGNOSIS — G8929 Other chronic pain: Secondary | ICD-10-CM | POA: Diagnosis not present

## 2024-10-27 DIAGNOSIS — M5442 Lumbago with sciatica, left side: Secondary | ICD-10-CM | POA: Diagnosis not present

## 2024-10-27 MED ORDER — HYDROXYZINE PAMOATE 25 MG PO CAPS: 25.0000 mg | ORAL_CAPSULE | Freq: Three times a day (TID) | ORAL | 1 refills | Status: DC | PRN

## 2024-10-27 MED ORDER — TRIAMCINOLONE ACETONIDE 0.5 % EX OINT: 1.0000 | TOPICAL_OINTMENT | Freq: Two times a day (BID) | CUTANEOUS | 3 refills | Status: AC

## 2024-10-27 NOTE — Progress Notes (Signed)
 Assessment & Plan   Assessment/Plan:    Assessment & Plan Chronic pruritic annular rash, left arm, refractory to antifungals Chronic pruritic annular rash on the left arm, refractory to antifungal treatments including ketoconazole and oral fluconazole . The rash has been present for several months with minimal improvement. Differential diagnosis includes tinea corporis and dermatitis. Fungal culture was inadequate with no fungi isolated. The rash is annular and pruritic, resembling tinea corporis, but has not responded adequately to antifungal treatment. Consideration of dermatitis as a differential diagnosis due to the lack of response to antifungals. - Referred to dermatology for further evaluation and management. - Prescribed triamcinolone  0.5% ointment to be applied twice daily. - Prescribed hydroxyzine for pruritus, to be taken at night due to sedative effects.  Type 2 diabetes mellitus, well controlled on Ozempic  Type 2 diabetes mellitus is well controlled with Ozempic  2 mg weekly. Last hemoglobin A1c was 5.8% in September, indicating good glycemic control. - Continue Ozempic  2 mg weekly.  Chronic left foot and ankle pain due to post-traumatic osteoarthritis Chronic left foot and ankle pain attributed to post-traumatic osteoarthritis. She has been following up with podiatry for management. - Continue follow-up with podiatry.  Chronic lower back pain Contributing to limited mobility and disability. She has been awarded Hydrologist. - Continue current management and follow-up with specialists as needed.  Bilateral knee pain, post bilateral knee replacement Bilateral knee pain following knee replacement surgery. She has been awarded Tree Surgeon Disability due to chronic pain and limited mobility. - Continue current management and follow-up with specialists as needed.        Medications Discontinued During This Encounter  Medication Reason    clotrimazole -betamethasone  (LOTRISONE ) cream     Return in about 3 months (around 01/27/2025) for DM.        Subjective:   Encounter date: 10/27/2024  Robin Small is a 63 y.o. female who has Dyslipidemia; ANXIETY DEPRESSION; Mild intermittent asthma; LUMBAR SPRAIN AND STRAIN; External hemorrhoids; Muscle ache; Dyspnea; OSA (obstructive sleep apnea); Sinusitis, acute; History of colonic polyps; Seasonal and perennial allergic rhinitis; Primary osteoarthritis of left knee; Primary osteoarthritis of right knee; Lichen sclerosus; S/P hemorrhoidectomy; TMJ (temporomandibular joint syndrome); Seasonal allergic rhinitis due to pollen; Class 1 obesity with serious comorbidity and body mass index (BMI) of 33.0 to 33.9 in adult; Sleep disturbance; Hemorrhoids, internal; Chronic pain of both knees; Tobacco abuse; Hyperlipidemia associated with type 2 diabetes mellitus (HCC); Post-operative nausea and vomiting; Type 2 diabetes mellitus without complication, without long-term current use of insulin (HCC); Chronic pain of left ankle; Chronic midline low back pain with left-sided sciatica; Extrinsic asthma; Type 2 diabetes mellitus with neurological complications (HCC); Left foot pain; Thyroid  nodule; Aortic atherosclerosis; Multinodular goiter; Panlobular emphysema (HCC); Folliculitis of axilla; and Coronary artery calcification seen on CAT scan on their problem list..   She  has a past medical history of Allergy, Anxiety, Asthma, Depression, Diverticulosis (03/2016), High cholesterol, History of kidney stones, Lichen sclerosus, Low blood pressure reading, OA (osteoarthritis), OSA on CPAP, Panlobular emphysema (HCC) (05/08/2024), Perianal cyst, Personal history of colonic polyps (04/06/2011), PONV (postoperative nausea and vomiting), PPD positive, Pre-diabetes, Seasonal allergies, Sleep apnea, Thyroid  nodule (02/04/2024), and Type 2 diabetes mellitus without complication, without long-term current use of insulin  (HCC) (05/27/2023)..   She presents with chief complaint of disabilty  (FMLA form ), Rash (Pt states rash has improved with mild itchiness present. //HM due- diabetic eye exam and mammogram ), and Foot Pain (Pt c/o of left foot pain.  Pt seen podiatry on 10/15/2024. ) Discussed the use of AI scribe software for clinical note transcription with the patient, who gave verbal consent to proceed.  History of Present Illness Robin Small is a 62 year old female who presents with a chronic rash.  Chronic annular rash - Persistent annular, raised rash on the left arm for several months, resembling tinea corporis - Minimal improvement with oral and topical antifungals, including ketoconazole and fluconazole  - Rash remains pruritic - Use of antifungal body wash - Fungal culture performed but inadequate, with no fungi isolated - No use of steroid creams to date  Chronic lower extremity pain and limited mobility - Chronic left foot pain and traumatic arthritis in the left ankle, limiting mobility - Ongoing care with a podiatrist for these issues  Chronic low back and bilateral knee pain - Chronic lower back pain and bilateral knee pain - Pain has impacted ability to work, resulting in receipt of Social Security Disability benefits  Type 2 diabetes mellitus - Managed with Ozempic , currently at a dose of 2 mg weekly - Last hemoglobin A1c in September was 5.8%   .  ROS  Past Surgical History:  Procedure Laterality Date   ARTHROSCOPIC KNEE Left    CESAREAN SECTION     COLONOSCOPY W/ POLYPECTOMY  04/11/2016   ENDOMETRIAL ABLATION     GANGLION CYST EXCISION Right    HEMORROIDECTOMY     JOINT REPLACEMENT  both knees 2020   lichen sclerosus lesion excision     Anus   TOTAL KNEE ARTHROPLASTY Left 03/03/2019   Procedure: TOTAL KNEE ARTHROPLASTY;  Surgeon: Robin Coy, MD;  Location: [facility];  Service: Orthopedics;  Laterality: Left;   TOTAL KNEE ARTHROPLASTY Right 09/15/2019   Procedure:  RIGHT TOTAL KNEE ARTHROPLASTY;  Surgeon: Robin Coy, MD;  Location: [facility];  Service: Orthopedics;  Laterality: Right;   TUBAL LIGATION  2001    Current Outpatient Medications on File Prior to Visit  Medication Sig Dispense Refill   acetaminophen  (TYLENOL ) 500 MG tablet Take 1,000 mg by mouth every 6 (six) hours as needed for moderate pain.     albuterol  (VENTOLIN  HFA) 108 (90 Base) MCG/ACT inhaler Inhale 1-2 puffs into the lungs every 6 (six) hours as needed. 8 g 0   atorvastatin  (LIPITOR) 20 MG tablet TAKE 1 TABLET BY MOUTH EVERY DAY 90 tablet 3   Azelastine  HCl 137 MCG/SPRAY SOLN SPRAY ONCE IN EACH NOSTRIL TWICE DAILY AS DIRECTED 30 mL 11   Calcium  Carbonate-Vitamin D (CALCIUM -D) 600-400 MG-UNIT TABS Take 2 tablets by mouth at bedtime.     chlorhexidine  (HIBICLENS ) 4 % external liquid Twice a day, rinse affected area water , THEN apply minimum amount of 4% solution necessary to cover skin or wound area and wash gently, THEN rinse water  473 mL 0   Cholecalciferol 1.25 MG (50000 UT) capsule Take 1 capsule every week by oral route.     ciclopirox (PENLAC) 8 % solution Apply topically at bedtime. Apply over nail and surrounding skin. Apply daily over previous coat. After seven (7) days, may remove with alcohol and continue cycle. 6.6 mL 12   clobetasol cream (TEMOVATE) 0.05 % Apply 1 application topically 2 (two) times daily as needed (skin irritation).     diclofenac  Sodium (VOLTAREN ) 1 % GEL APPLY 4 GRAMS TOPICALLY TO THE AFFECTED AREA FOUR TIMES DAILY AS NEEDED FOR PAIN 300 g 0   fexofenadine  (ALLEGRA  ALLERGY) 180 MG tablet Take 1 tablet (180 mg total) by mouth daily.  90 tablet 0   FLUCELVAX 0.5 ML injection      fluticasone  (FLONASE ) 50 MCG/ACT nasal spray SPRAY 2 SPRAYS INTO EACH NOSTRIL EVERY DAY 48 mL 1   ketoconazole (NIZORAL) 2 % cream Apply 1 Application topically 2 (two) times daily. 30 g 0   meloxicam (MOBIC) 15 MG tablet Take 1 tablet (15 mg total) by mouth daily. 30 tablet 0    montelukast  (SINGULAIR ) 10 MG tablet TAKE 1 TABLET BY MOUTH EVERYDAY AT BEDTIME 90 tablet 2   mupirocin  ointment (BACTROBAN ) 2 % Apply 1 Application topically 2 (two) times daily. 22 g 1   nystatin  (MYCOSTATIN ) 100000 UNIT/ML suspension Take 5 mLs (500,000 Units total) by mouth 4 (four) times daily. Swish and swallow 200 mL 0   ondansetron  (ZOFRAN -ODT) 4 MG disintegrating tablet TAKE 1-2 TABLETS (4-8 MG TOTAL) BY MOUTH EVERY 8 (EIGHT) HOURS AS NEEDED. 20 tablet 0   Semaglutide , 2 MG/DOSE, 8 MG/3ML SOPN Inject 2 mg as directed once a week. 9 mL 0   SPIKEVAX syringe      tiZANidine  (ZANAFLEX ) 4 MG capsule tizanidine  4 mg capsule     triamcinolone  (KENALOG ) 0.1 % paste Use as directed 1 Application in the mouth or throat 2 (two) times daily. 5 g 12   zolpidem  (AMBIEN ) 10 MG tablet Take 1 tablet (10 mg total) by mouth at bedtime as needed. for sleep 90 tablet 1   brompheniramine-pseudoephedrine -DM 30-2-10 MG/5ML syrup Take 5 mLs by mouth 4 (four) times daily as needed. 120 mL 0   ibuprofen (ADVIL) 600 MG tablet Take 1 tablet (600 mg total) by mouth every 8 (eight) hours as needed for moderate pain (pain score 4-6). (Patient not taking: Reported on 10/27/2024) 60 tablet 0   metoCLOPramide  (REGLAN ) 5 MG tablet Take 1 tablet (5 mg total) by mouth every 8 (eight) hours as needed for nausea. 30 tablet 0   varenicline  (CHANTIX  CONTINUING MONTH PAK) 1 MG tablet Take 1 tablet (1 mg total) by mouth 2 (two) times daily. 60 tablet 5   No current facility-administered medications on file prior to visit.    Family History  Problem Relation Age of Onset   Colon polyps Mother    Diabetes Mother    Hypertension Mother    Heart disease Mother    Hyperlipidemia Mother    Arthritis Mother    Kidney disease Mother    Stroke Mother    Heart attack Mother        2018 heart failure   Lung cancer Father    Varicose Veins Sister    Alcohol abuse Brother    Hepatitis Brother        hep c   Early death Brother     Drug abuse Brother    Breast cancer Maternal Grandmother        breast   Colon cancer Neg Hx    Esophageal cancer Neg Hx    Rectal cancer Neg Hx    Stomach cancer Neg Hx     Social History   Socioeconomic History   Marital status: Married    Spouse name: Not on file   Number of children: Not on file   Years of education: Not on file   Highest education level: Associate degree: academic program  Occupational History   Occupation: internet support  Tobacco Use   Smoking status: Former    Current packs/day: 0.00    Average packs/day: 0.5 packs/day for 30.0 years (15.0 ttl pk-yrs)  Types: Cigarettes    Start date: 07/30/1989    Quit date: 07/31/2019    Years since quitting: 5.2    Passive exposure: Never   Smokeless tobacco: Never   Tobacco comments:    1 pack per week on and off        Pt stop smoking 1 month ago (August 2025).  Vaping Use   Vaping status: Never Used  Substance and Sexual Activity   Alcohol use: Not Currently    Comment: Maybe once a month   Drug use: No   Sexual activity: Yes    Partners: Male    Birth control/protection: Post-menopausal, Surgical  Other Topics Concern   Not on file  Social History Narrative   Exercise-- daily for 1 hour   Social Drivers of Health   Financial Resource Strain: High Risk (10/08/2024)   Overall Financial Resource Strain (CARDIA)    Difficulty of Paying Living Expenses: Very hard  Food Insecurity: Food Insecurity Present (10/08/2024)   Hunger Vital Sign    Worried About Running Out of Food in the Last Year: Sometimes true    Ran Out of Food in the Last Year: Sometimes true  Transportation Needs: Unmet Transportation Needs (10/08/2024)   PRAPARE - Transportation    Lack of Transportation (Medical): Yes    Lack of Transportation (Non-Medical): Yes  Physical Activity: Sufficiently Active (10/08/2024)   Exercise Vital Sign    Days of Exercise per Week: 5 days    Minutes of Exercise per Session: 30 min  Stress:  Stress Concern Present (10/08/2024)   [employer] of Occupational Health - Occupational Stress Questionnaire    Feeling of Stress: Very much  Social Connections: Moderately Integrated (10/08/2024)   Social Connection and Isolation Panel    Frequency of Communication with Friends and Family: More than three times a week    Frequency of Social Gatherings with Friends and Family: Once a week    Attends Religious Services: Never    Database Administrator or Organizations: Yes    Attends Engineer, Structural: More than 4 times per year    Marital Status: Married  Catering Manager Violence: Not on file                                                                                                  Objective:  Physical Exam: BP 113/70 (BP Location: Left Arm, Patient Position: Sitting, Cuff Size: Large)   Pulse 60   Temp (!) 97 F (36.1 C) (Temporal)   Resp 18   Wt 171 lb 6.4 oz (77.7 kg)   SpO2 98%   BMI 32.39 kg/m    GENERAL: Alert, cooperative, well developed, no acute distress. HEENT: Normocephalic, normal oropharynx, moist mucous membranes. CHEST: Clear to auscultation bilaterally, no wheezes, rhonchi, or crackles. CARDIOVASCULAR: Normal heart rate and rhythm, S1 and S2 normal without murmurs. ABDOMEN: Soft, non-tender, non-distended, without organomegaly, normal bowel sounds. EXTREMITIES: No cyanosis or edema. NEUROLOGICAL: Cranial nerves grossly intact, moves all extremities without gross motor or sensory deficit. SKIN: Annular lesions with some clearing on left upper arm  and upper back.  No results found.  Recent Results (from the past 2160 hours)  POCT glycosylated hemoglobin (Hb A1C)     Status: Abnormal   Collection Time: 08/21/24  9:50 AM  Result Value Ref Range   Hemoglobin A1C 5.8 (A) 4.0 - 5.6 %   HbA1c POC (<> result, manual entry)     HbA1c, POC (prediabetic range)     HbA1c, POC (controlled diabetic range)    Culture, Fungus without Smear      Status: None (Preliminary result)   Collection Time: 10/08/24 12:07 PM   Specimen: Other  Result Value Ref Range   MICRO NUMBER: [id number]    SPECIMEN QUALITY: Adequate    Source: NOT GIVEN    STATUS: PRELIMINARY    CULTURE:      No fungi isolated to date. Culture is examined weekly for a total of 28 days incubation. A change in status will result in an updated culture report.        Beverley KATHEE Hummer, MD  I,Emily Lagle,acting as a scribe for Beverley KATHEE Hummer, MD.,have documented all relevant documentation on the behalf of Beverley KATHEE Hummer, MD.  LILLETTE Beverley KATHEE Hummer, MD, have reviewed all documentation for this visit. The documentation on 10/27/2024 for the exam, diagnosis, procedures, and orders are all accurate and complete.

## 2024-10-27 NOTE — Patient Instructions (Signed)
 It was very nice to see you today!  VISIT SUMMARY: During your visit, we discussed your chronic rash, diabetes management, and ongoing pain issues. We have made some adjustments to your treatment plan and provided referrals for further evaluation.  YOUR PLAN: CHRONIC PRURITIC ANNULAR RASH: You have a persistent, itchy rash on your left arm that has not improved with antifungal treatments. -You are referred to dermatology for further evaluation and management. -Apply triamcinolone  0.5% ointment to the rash twice daily. -Take hydroxyzine at night to help with the itching.  TYPE 2 DIABETES MELLITUS: Your diabetes is well controlled with your current medication. -Continue taking Ozempic  2 mg weekly.  CHRONIC LEFT FOOT AND ANKLE PAIN: You have ongoing pain in your left foot and ankle due to post-traumatic osteoarthritis. -Continue following up with your podiatrist for management.  CHRONIC LOWER BACK PAIN: You have chronic lower back pain that affects your mobility and has led to disability. -Continue your current management plan and follow up with specialists as needed.  BILATERAL KNEE PAIN: You have chronic knee pain following knee replacement surgery, which affects your mobility and has led to disability. -Continue your current management plan and follow up with specialists as needed.  No follow-ups on file.   Take care, Arvella Hummer, MD, MS   PLEASE NOTE:  If you had any lab tests, please let us  know if you have not heard back within a few days. You may see your results on mychart before we have a chance to review them but we will give you a call once they are reviewed by us .   If we ordered any referrals today, please let us  know if you have not heard from their office within the next week.   If you had any urgent prescriptions sent in today, please check with the pharmacy within an hour of our visit to make sure the prescription was transmitted appropriately.   Please try these  tips to maintain a healthy lifestyle:  Eat at least 3 REAL meals and 1-2 snacks per day.  Aim for no more than 5 hours between eating.  If you eat breakfast, please do so within one hour of getting up.   Each meal should contain half fruits/vegetables, one quarter protein, and one quarter carbs (no bigger than a computer mouse)  Cut down on sweet beverages. This includes juice, soda, and sweet tea.   Drink at least 1 glass of water  with each meal and aim for at least 8 glasses per day  Exercise at least 150 minutes every week.

## 2024-10-30 NOTE — Therapy (Deleted)
 OUTPATIENT PHYSICAL THERAPY THORACOLUMBAR EVALUATION   Patient Name: Robin Small MRN: [id number] DOB:01/29/1961, 63 y.o., female Today's Date: 10/30/2024  END OF SESSION:   Past Medical History:  Diagnosis Date   Allergy    Anxiety    Asthma    Seasonal allergies/ mild emphysema   Depression    Diverticulosis 03/2016   Mild, noted on colonoscopy   High cholesterol    History of kidney stones    Lichen sclerosus    Anus   Low blood pressure reading    OA (osteoarthritis)    OSA on CPAP    Panlobular emphysema (HCC) 05/08/2024   Perianal cyst    Personal history of colonic polyps 04/06/2011   PONV (postoperative nausea and vomiting)    after c section, did well after knee replacement   PPD positive    Age 28 >> reports taking medicine for one year   Pre-diabetes    per pt report   Seasonal allergies    Sleep apnea    Thyroid  nodule 02/04/2024   Type 2 diabetes mellitus without complication, without long-term current use of insulin (HCC) 05/27/2023   Past Surgical History:  Procedure Laterality Date   ARTHROSCOPIC KNEE Left    CESAREAN SECTION     COLONOSCOPY W/ POLYPECTOMY  04/11/2016   ENDOMETRIAL ABLATION     GANGLION CYST EXCISION Right    HEMORROIDECTOMY     JOINT REPLACEMENT  both knees 2020   lichen sclerosus lesion excision     Anus   TOTAL KNEE ARTHROPLASTY Left 03/03/2019   Procedure: TOTAL KNEE ARTHROPLASTY;  Surgeon: Sheril Coy, MD;  Location: [facility];  Service: Orthopedics;  Laterality: Left;   TOTAL KNEE ARTHROPLASTY Right 09/15/2019   Procedure: RIGHT TOTAL KNEE ARTHROPLASTY;  Surgeon: Sheril Coy, MD;  Location: [facility];  Service: Orthopedics;  Laterality: Right;   TUBAL LIGATION  2001   Patient Active Problem List   Diagnosis Date Noted   Coronary artery calcification seen on CAT scan 06/01/2024   Panlobular emphysema (HCC) 05/08/2024   Folliculitis of axilla 05/08/2024   Multinodular goiter 04/28/2024   Left foot pain 02/04/2024    Thyroid  nodule 02/04/2024   Aortic atherosclerosis 02/04/2024   Chronic midline low back pain with left-sided sciatica 09/27/2023   Extrinsic asthma 09/27/2023   Type 2 diabetes mellitus with neurological complications (HCC) 09/27/2023   Type 2 diabetes mellitus without complication, without long-term current use of insulin (HCC) 05/27/2023   Chronic pain of left ankle 05/27/2023   Hyperlipidemia associated with type 2 diabetes mellitus (HCC) 03/01/2023   Chronic pain of both knees 12/29/2022   Tobacco abuse 12/29/2022   Hemorrhoids, internal 08/31/2022   Lichen sclerosus 05/24/2022   TMJ (temporomandibular joint syndrome) 05/24/2022   Seasonal allergic rhinitis due to pollen 05/24/2022   Class 1 obesity with serious comorbidity and body mass index (BMI) of 33.0 to 33.9 in adult 05/24/2022   Sleep disturbance 05/24/2022   Primary osteoarthritis of right knee 09/15/2019   Primary osteoarthritis of left knee 03/03/2019   Seasonal and perennial allergic rhinitis 02/13/2019   S/P hemorrhoidectomy 02/01/2017   Post-operative nausea and vomiting 01/24/2017   Sinusitis, acute 08/24/2013   Dyspnea 01/08/2013   OSA (obstructive sleep apnea) 01/08/2013   Muscle ache 02/08/2012   History of colonic polyps 04/06/2011   External hemorrhoids 12/27/2010   Mild intermittent asthma 06/03/2010   LUMBAR SPRAIN AND STRAIN 07/20/2008   ANXIETY DEPRESSION 06/28/2008   Dyslipidemia 07/10/2007    PCP:  Sebastian Beverley NOVAK, MD   REFERRING PROVIDER: Johnanna Camie Mates, PA-C  REFERRING DIAG: [phone] (ICD-10-CM) - Spondylolisthesis, lumbar region  Rationale for Evaluation and Treatment: Rehabilitation  THERAPY DIAG:  No diagnosis found.  ONSET DATE: ***  SUBJECTIVE:                                                                                                                                                                                           SUBJECTIVE STATEMENT: This is a 63 year old  female who presents for evaluation of LBP and stiffness. She reports onset around the time of her BL TKA in 2020. Radiation down lateral aspect of L leg, ankle, foot, to 5th toe. Sometimes associated with numbness/tingling and muscle spasms. Worst w/ prolonged sitting or standing, better once moving/ able to adjust. She is most affected by the pain distal to the L knee. No gait changes, no bowel or bladder dysfunction.Most recent PT in 2023, has been engaging in HEP since. Some relief with tinazedine and gabapentin, but reports only being able to take them sparingly as both are very sedating.  PERTINENT HISTORY:  Pt presents s/p L L5-S1 TFESI. Reports improved symptoms in her LBP and LLE/foot in the AM, but by afternoons she is usually back to her baseline. Symptoms constant.  PAIN:  Are you having pain? Yes: NPRS scale: *** Pain location: *** Pain description: *** Aggravating factors: *** Relieving factors: ***  PRECAUTIONS: None  RED FLAGS: None   WEIGHT BEARING RESTRICTIONS: No  FALLS:  Has patient fallen in last 6 months? No  OCCUPATION: ***  PLOF: Independent  PATIENT GOALS: To manage my back pain  NEXT MD VISIT: TBD  OBJECTIVE:  Note: Objective measures were completed at Evaluation unless otherwise noted.  DIAGNOSTIC FINDINGS:  None recent  PATIENT SURVEYS:  Modified Oswestry:  MODIFIED OSWESTRY DISABILITY SCALE  Date: *** Score  Pain intensity {ODI 1:32962}  2. Personal care (washing, dressing, etc.) {ODI 2:32963}  3. Lifting {ODI 3:32964}  4. Walking {ODI 4:32965}  5. Sitting {ODI 5:32966}  6. Standing {ODI 6:32967}  7. Sleeping {ODI 7:32968}  8. Social Life {ODI 8:32969}  9. Traveling {ODI 9:32970}  10. Employment/ Homemaking {ODI 10:32971}  Total ***/50   Interpretation of scores: Score Category Description  0-20% Minimal Disability The patient can cope with most living activities. Usually no treatment is indicated apart from advice on lifting, sitting  and exercise  21-40% Moderate Disability The patient experiences more pain and difficulty with sitting, lifting and standing. Travel and social life are more difficult and they may be disabled from work. Personal care, sexual activity and sleeping  are not grossly affected, and the patient can usually be managed by conservative means  41-60% Severe Disability Pain remains the main problem in this group, but activities of daily living are affected. These patients require a detailed investigation  61-80% Crippled Back pain impinges on all aspects of the patient's life. Positive intervention is required  81-100% Bed-bound These patients are either bed-bound or exaggerating their symptoms  Bluford FORBES Zoe DELENA Karon DELENA, et al. Surgery versus conservative management of stable thoracolumbar fracture: the PRESTO feasibility RCT. [place] ([place]): [employer]; 2021 Nov. [facility] Technology Assessment, No. 25.62.) Appendix 3, Oswestry Disability Index category descriptors. Available from: Findjewelers.cz  Minimally Clinically Important Difference (MCID) = 12.8%  MUSCLE LENGTH: Hamstrings: Right *** deg; Left *** deg Debby test: Right *** deg; Left *** deg  POSTURE: {posture:25561}  PALPATION: ***  LUMBAR ROM:   AROM eval  Flexion   Extension   Right lateral flexion   Left lateral flexion   Right rotation   Left rotation    (Blank rows = not tested)  LOWER EXTREMITY ROM:     {AROM/PROM:27142}  Right eval Left eval  Hip flexion    Hip extension    Hip abduction    Hip adduction    Hip internal rotation    Hip external rotation    Knee flexion    Knee extension    Ankle dorsiflexion    Ankle plantarflexion    Ankle inversion    Ankle eversion     (Blank rows = not tested)  LOWER EXTREMITY MMT:    MMT Right eval Left eval  Hip flexion    Hip extension    Hip abduction    Hip adduction    Hip internal rotation    Hip external  rotation    Knee flexion    Knee extension    Ankle dorsiflexion    Ankle plantarflexion    Ankle inversion    Ankle eversion     (Blank rows = not tested)  LUMBAR SPECIAL TESTS:  Straight leg raise test: {pos/neg:25243} and Slump test: {pos/neg:25243}  FUNCTIONAL TESTS:  30 seconds chair stand test  GAIT: Distance walked: *** Assistive device utilized: {Assistive devices:23999} Level of assistance: {Levels of assistance:24026} Comments: ***  TREATMENT DATE: ***                                                                                                                                 PATIENT EDUCATION:  Education details: Discussed eval findings, rehab rationale and POC and patient is in agreement  Person educated: Patient Education method: Explanation and Handouts Education comprehension: verbalized understanding and needs further education  HOME EXERCISE PROGRAM: ***  ASSESSMENT:  CLINICAL IMPRESSION: Patient is a *** y.o. *** who was seen today for physical therapy evaluation and treatment for ***.   OBJECTIVE IMPAIRMENTS: {opptimpairments:25111}.   ACTIVITY LIMITATIONS: {activitylimitations:27494}  PERSONAL FACTORS: {Personal factors:25162} are also affecting patient's  functional outcome.   REHAB POTENTIAL: Fair based on dx, chronicity and unsuccessful conservative treatment to date  CLINICAL DECISION MAKING: Evolving/moderate complexity  EVALUATION COMPLEXITY: Moderate   GOALS: Goals reviewed with patient? No  SHORT TERM GOALS: Target date: ***  Patient to demonstrate independence in HEP  Baseline: Goal status: INITIAL  2.  *** Baseline:  Goal status: INITIAL  3.  *** Baseline:  Goal status: INITIAL  4.  *** Baseline:  Goal status: INITIAL  5.  *** Baseline:  Goal status: INITIAL  6.  *** Baseline:  Goal status: INITIAL  LONG TERM GOALS: Target date: ***  Patient will increase 30s chair stand reps from *** to *** with/without  arms to demonstrate and improved functional ability with less pain/difficulty as well as reduce fall risk.  Baseline:  Goal status: INITIAL  2.  Patient will score at least ***% on FOTO to signify clinically meaningful improvement in functional abilities.   Baseline:  Goal status: INITIAL  3.  Patient will acknowledge ***/10 pain at least once during episode of care   Baseline:  Goal status: INITIAL  4.  *** Baseline:  Goal status: INITIAL  5.  *** Baseline:  Goal status: INITIAL  6.  *** Baseline:  Goal status: INITIAL  PLAN:  PT FREQUENCY: {rehab frequency:25116}  PT DURATION: {rehab duration:25117}  PLANNED INTERVENTIONS: {rehab planned interventions:25118::97110-Therapeutic exercises,97530- Therapeutic [id number]- Neuromuscular re-education,97535- Self Rjmz,02859- Manual therapy,Patient/Family education}.  PLAN FOR NEXT SESSION: ***   Johnnie Moten M Alberta Cairns, PT 10/30/2024, 1:30 PM

## 2024-11-01 ENCOUNTER — Encounter: Payer: Self-pay | Admitting: Pulmonary Disease

## 2024-11-01 DIAGNOSIS — G4733 Obstructive sleep apnea (adult) (pediatric): Secondary | ICD-10-CM

## 2024-11-02 ENCOUNTER — Ambulatory Visit

## 2024-11-08 ENCOUNTER — Other Ambulatory Visit: Payer: Self-pay | Admitting: Family Medicine

## 2024-11-08 DIAGNOSIS — E6609 Other obesity due to excess calories: Secondary | ICD-10-CM

## 2024-11-08 DIAGNOSIS — E119 Type 2 diabetes mellitus without complications: Secondary | ICD-10-CM

## 2024-11-09 LAB — CULTURE, FUNGUS WITHOUT SMEAR
CULTURE:: NO GROWTH
MICRO NUMBER:: 17139216
SPECIMEN QUALITY:: ADEQUATE

## 2024-11-09 NOTE — Telephone Encounter (Signed)
 Requesting: OZEMPIC , 2 MG/DOSE, 8 MG/3ML Solution Pen-injector Last Visit: 10/27/2024 Next Visit: Visit date not found Last Refill: 08/21/2024  Please Advise (Routing to doc of day due to Dr. Sebastian being out)

## 2024-11-13 ENCOUNTER — Other Ambulatory Visit: Payer: Self-pay | Admitting: Podiatry

## 2024-11-13 DIAGNOSIS — M19072 Primary osteoarthritis, left ankle and foot: Secondary | ICD-10-CM

## 2024-11-15 ENCOUNTER — Telehealth: Payer: Self-pay | Admitting: Pulmonary Disease

## 2024-11-15 NOTE — Procedures (Signed)
 [facility] [address] [place], [place] [address] Tel: [phone]   Fax: [phone]  Titration Interpretation  Patient Name:  Robin Small, Robin Small Date:  10/21/2024 Referring Physician:  JENNET EPLEY [phone]) %%startinterp%% Indications for Polysomnography The patient is a 63 year old Female who is 5' 1 and weighs 167.0 lbs. Her BMI equals 31.9.  A full night titration treatment study was performed.  Medication  AMBIEN    Polysomnogram Data A full night polysomnogram recorded the standard physiologic parameters including EEG, EOG, EMG, EKG, nasal and oral airflow.  Respiratory parameters of chest and abdominal movements were recorded with Respiratory Inductance Plethysmography belts.  Oxygen  saturation was recorded by pulse oximetry.   Sleep Architecture The total recording time of the polysomnogram was 380.4 minutes.  The total sleep time was 269.5 minutes.  The patient spent 15.8% of total sleep time in Stage N1, 70.9% in Stage N2, 12.4% in Stages N3, and 0.9% in REM.  Sleep latency was 30.8 minutes.  REM latency was 285.0 minutes.  Sleep Efficiency was 70.9%.  Wake after Sleep Onset time was 80.0 minutes.  Titration Summary The patient was titrated at pressures ranging from 5/2* cm/H20 with supplemental oxygen  at - up to 10/2* cm/H20 with supplemental oxygen  at -.  The last pressure used in the study was 10/2* cm/H20 with supplemental oxygen  at -.  Respiratory Events The polysomnogram revealed a presence of - obstructive, - central, and - mixed apneas resulting in an Apnea index of - events per hour.  There were 6 hypopneas (>=3% desaturation and/or arousal) resulting in an Apnea\Hypopnea Index (AHI >=3% desaturation and/or arousal) of 1.3 events per hour.  There were - hypopneas (>=4% desaturation) resulting in an Apnea\Hypopnea Index (AHI >=4% desaturation) of - events per hour.  There were 38 Respiratory Effort Related Arousals resulting  in a RERA index of 8.5 events per hour. The Respiratory Disturbance Index is 9.8 events per hour.  The snore index was - events per hour.  Mean oxygen  saturation was 94.6%.  The lowest oxygen  saturation during sleep was 91.0%.  Time spent <=88% oxygen  saturation was - minutes (-).  Limb Activity There were 200 limb movements recorded.  Of this total, 181 were classified as PLMs.  Of the PLMs, 43 were associated with arousals.  The Limb Movement index was 44.5 per hour while the PLM index was 40.3 per hour.  Cardiac Summary The average pulse rate was 62.3 bpm.  The minimum pulse rate was 55.0 bpm while the maximum pulse rate was 92.0 bpm.  Cardiac rhythm was normal/abnormal.  Comments: Successful titration to CPAP therapy  Diagnosis:  Moderate obstructive sleep apnea successfully titrated to CPAP therapy. Fair sleep efficiency, limited REM sleep, increased wake after sleep onset. Moderate periodic limb movement  Cardiac rhythm was sinus  Tolerated CPAP titration well  Recommendations: CPAP of 10 with EPR of 2 with heated humidification with patient's mask of choice Patient used a medium size ResMed AirTouch fullface mask Further evaluation and management of periodic limb movement Avoid alcohol, sedatives and other CNS depressants that may worsen sleep apnea and disrupt normal sleep architecture. Sleep hygiene should be reviewed to assess factors that may improve sleep quality. Weight management and regular exercise should be initiated or continued  This study was personally reviewed and electronically signed by: Dr. Jennet Epley Accredited Board Certified in Sleep Medicine Date/Time:   11/15/24  %%endinterp%%  Titration Report  Patient Name: Robin Small Study Date: 10/21/2024  Date of Birth:  01-Oct-1961 Study Type: CPAP Titration  Age: 4 year MRN #: [id number]  Sex: Female Interpreting Physician: NEDA HAMMOND, [id number]  Height: 5' 1 Referring Physician: HAMMOND NEDA [phone])  Weight: 167.0 lbs Recording Tech: Dewane Hacker CRT RPSGT RST  BMI: 31.9 Scoring Tech: Dewane Hacker CRT RPSGT RST  ESS: 1 Neck Size: 14.25  Mask Type RESMED AIRTOUCH FFM Final Pressure: 10cmH2O  Mask Size: MEDIUM Supplemental O2: N/A   Study Overview  Lights Off: 10:51:22 PM  Count Index  Lights On: 05:11:43 AM Awakenings: 38 8.5  Time in Bed: 380.4 min. Arousals: 157 35.0  Total Sleep Time: 269.5 min. AHI (>=3% Desat and/or Ar.): 6 1.3   Sleep Efficiency: 70.9% AHI (>=4% Desat): - -   Sleep Latency: 30.8 min. Limb Movements: 200 44.5  Wake After Sleep Onset: 80.0 min. Snore: - -  REM Latency from Sleep Onset: 285.0 min. Desaturations: 9 2.0     Minimum SpO2 TST: 91.0%    Sleep Architecture  % of Time in Bed Stages Time (mins) % Sleep Time  Wake 111.5   Stage N1 42.5 15.8%  Stage N2 191.0 70.9%  Stage N3 33.5 12.4%  REM 2.5 0.9%   Arousal Summary   NREM REM Sleep Index  Respiratory Arousals 41 - 41 9.1  PLM Arousals 43 - 43 9.6  Isolated Limb Movement Arousals 7 - 7 1.6  Snore Arousals - - - -  Spontaneous Arousals 67 1 68 15.1  Total 156 1 157 35.0   Limb Movement Summary   Count Index  Isolated Limb Movements 19 4.2  Periodic Limb Movements (PLMs) 181 40.3  Total Limb Movements 200 44.5    Respiratory Summary   By Sleep Stage By Body Position Total   NREM REM Supine Non-Supine   Time (min) 267.0 2.5 - 269.5 269.5         Obstructive Apnea - - - - -  Mixed Apnea - - - - -  Central Apnea - - - - -  Total Apneas - - - - -  Total Apnea Index - - - - -         Hypopneas (>=3% Desat and/or Ar.) 5 1 - 6 6  AHI (>=3% Desat and/or Ar.) 1.1 24.0 - 1.3 1.3         Hypopneas (>=4% Desat) - - - - -  AHI (>=4% Desat) - - - - -          RERAs 38 - - 38 38  RERA Index 8.5 - - 8.5 8.5         RDI 9.7 24.0 - 9.8 9.8     Respiratory Event Durations   Apnea Hypopnea   NREM REM NREM REM  Average (seconds) - - 33.7 19.7  Maximum (seconds) -  - 51.0 19.7    Oxygen  Saturation Summary   Wake NREM REM TST TIB  Average SpO2 94.8% 94.6% 93.8% 94.5% 94.6%  Minimum SpO2 92.0% 91.0% 92.0% 91.0% 91.0%  Maximum SpO2 98.0% 97.0% 95.0% 97.0% 98.0%   Oxygen  Saturation Distribution  Range (%) Time in range (min) Time in range (%)  90.0 - 100.0 369.4 100.0%  80.0 - 90.0 - -  70.0 - 80.0 - -  60.0 - 70.0 - -  50.0 - 60.0 - -  0.0 - 50.0 - -  Time Spent <=88% SpO2  Range (%) Time in range (min) Time in range (%)  0.0 - 88.0 - -  Count Index  Desaturations 9 2.0    Cardiac Summary   Wake NREM REM Sleep Total  Average Pulse Rate (BPM) 66.0 60.9 73.0 61.0 62.3  Minimum Pulse Rate (BPM) 55.0 56.0 61.0 56.0 55.0  Maximum Pulse Rate (BPM) 92.0 76.0 82.0 82.0 92.0   Pulse Rate Distribution:  Range (bpm) Time in range (min) Time in range (%)  0.0 - 40.0 - -  40.0 - 60.0 147.6 39.9%  60.0 - 80.0 216.3 58.5%  80.0 - 100.0 4.4 1.2%  100.0 - 120.0 - -  120.0 - 140.0 - -  140.0 - 200.0 - -   Titration Summary  PAP Device PAP Level O2 Level Time (min) Wake (min) NREM (min) REM (min) Supine TST (min) Sleep Eff% OA# CA# MA# Hyp# (>=3%) AHI (>=3%) Hyp# (>=4%) AHI (>=%4) RERA RDI SpO2 <=88% (min) Min SpO2 Mean SpO2 Ar. Index  CPAP EPR 5/2 - 52.5 36.0 16.5 0.0  0.0 31.4% - - - - - -  - 7  25.5  0.0 93.0 93.9 87.3  CPAP EPR 6/2 - 52.0 35.5 16.5 0.0  31.7% - - - 1 3.6 -  - 17  65.5  0.0 91.0 93.6 112.7  CPAP EPR 8/2 - 14.5 0.0 14.5 0.0  100.0% - - - 1 4.1 -  - 5  24.8  0.0 91.0 93.6 82.8  CPAP EPR 9/2 - 36.0 0.5 35.5 0.0  98.6% - - - 1 1.7 -  - 4  8.5  0.0 92.0 94.5 15.2  CPAP EPR 10/2 - 226.0 39.5 184.0 2.5  82.5% - - - 3 1.0 -  - 5  2.6  0.0 92.0 94.8 23.5   Hypnograms                           Technologist Comments  THE 65-YEAR-OLD FEMALE PATIENT PRESENTED TO THE SLEEP DISORDER CENTER FOR A CPAP TITRATION STUDY WITH A CHIEF COMPLAINT OF OSA. THE PATIENT WAS FITTED WITH A MEDIUM RESMED AIRTOUCH F20  FULL FACE MASK FOR PRE-CPAP TRIAL, WHICH WAS TOLERATED WELL. ALL BEDTIME MEDICATIONS WERE SELF ADMINISTERED AT 2126. THE CPAP TITRATION WAS EXPLAINED, THE LEAD PLACEMENT WAS INITIATED, THEN THE STUDY WAS BEGUN. THE PATIENT SLEPT ON A WEDGE WITH ONE PILLOW THROUGHOUT THE STUDY. THE CPAP TRIAL WAS INITIATED USING THE ABOVE MASK TYPE ON A PRESSURE OF 5cmH2O WITH AN EPR OF 2 AND HEATED HUMIDIFICATION. THE CPAP PRESSURE WAS INCREASED FOR SNORING, RESPIRATORY EVENTS AND RERAs UNTIL OPTIMAL PRESSURE WAS ACHIEVED. SUPPLEMENTAL OXYGEN  WAS NOT WARRANTED DURING THE STUDY. PLMs - PLMAs WERE NOTED. ONE RESTROOM VISIT WAS MADE. NO OBVIOUS PARASOMNIAs WERE OBSERVED. NO OBVIOUS QUESTIONABLE CARDIAC ARRHYTHMIAS WERE OBSERVED. THE PATIENT TOLERATED THE CPAP TRIAL WELL.

## 2024-11-15 NOTE — Telephone Encounter (Signed)
 Call patient  Sleep study result  Date of study:  10/21/2024 Impression: Moderate obstructive sleep apnea adequately treated with CPAP therapy  Recommendation: DME referral  CPAP of 10 with EPR of 2 with heated humidification with patient's mask of choice Patient used a medium size ResMed AirTouch fullface mask  Encourage weight loss measures.  Schedule follow-up in the office 4 to 6 weeks after initiation of treatment

## 2024-11-15 NOTE — Procedures (Signed)
  Indications for Polysomnography The patient is a 63 year old Female who is 5' 1 and weighs 167.0 lbs. Her BMI equals 31.9.  A full night titration treatment study was performed.  MedicationAMBIEN Polysomnogram Data A full night polysomnogram recorded the standard physiologic parameters including EEG, EOG, EMG, EKG, nasal and oral airflow.  Respiratory parameters of chest and abdominal movements were recorded with Respiratory Inductance Plethysmography belts.   Oxygen  saturation was recorded by pulse oximetry.  Sleep Architecture The total recording time of the polysomnogram was 380.4 minutes.  The total sleep time was 269.5 minutes.  The patient spent 15.8% of total sleep time in Stage N1, 70.9% in Stage N2, 12.4% in Stages N3, and 0.9% in REM.  Sleep latency was 30.8 minutes.   REM latency was 285.0 minutes.  Sleep Efficiency was 70.9%.  Wake after Sleep Onset time was 80.0 minutes.  Titration Summary The patient was titrated at pressures ranging from 5/2* cm/H20 with supplemental oxygen  at - up to 10/2* cm/H20 with supplemental oxygen  at -.  The last pressure used in the study was 10/2* cm/H20 with supplemental oxygen  at -.  Respiratory Events The polysomnogram revealed a presence of - obstructive, - central, and - mixed apneas resulting in an Apnea index of - events per hour.  There were 6 hypopneas (GreaterEqual to3% desaturation and/or arousal) resulting in an Apnea\Hypopnea Index (AHI  GreaterEqual to3% desaturation and/or arousal) of 1.3 events per hour.  There were - hypopneas (GreaterEqual to4% desaturation) resulting in an Apnea\Hypopnea Index (AHI GreaterEqual to4% desaturation) of - events per hour.  There were 38 Respiratory  Effort Related Arousals resulting in a RERA index of 8.5 events per hour. The Respiratory Disturbance Index is 9.8 events per hour.  The snore index was - events per hour.  Mean oxygen  saturation was 94.6%.  The lowest oxygen  saturation during sleep was  91.0%.  Time spent LessEqual to88% oxygen  saturation was  minutes ().  Limb Activity There were 200 limb movements recorded.  Of this total, 181 were classified as PLMs.  Of the PLMs, 43 were associated with arousals.  The Limb Movement index was 44.5 per hour while the PLM index was 40.3 per hour.  Cardiac Summary The average pulse rate was 62.3 bpm.  The minimum pulse rate was 55.0 bpm while the maximum pulse rate was 92.0 bpm.  Cardiac rhythm was normal/abnormal.  Comments: Successful titration to CPAP therapy  Diagnosis: Moderate obstructive sleep apnea successfully titrated to CPAP therapy. Fair sleep efficiency, limited REM sleep, increased wake after sleep onset. Moderate periodic limb movement Cardiac rhythm was sinus  Tolerated CPAP titration well  Recommendations: CPAP of 10 with EPR of 2 with heated humidification with patient's mask of choice Patient used a medium size ResMed AirTouch fullface mask Further evaluation and management of periodic limb movement Avoid alcohol, sedatives and other CNS depressants that may worsen sleep apnea and disrupt normal sleep architecture. Sleep hygiene should be reviewed to assess factors that may improve sleep quality. Weight management and regular exercise should be initiated or continued  This study was personally reviewed and electronically signed by: Dr. Jennet Epley Accredited Board Certified in Sleep Medicine Date/Time:  11/15/24

## 2024-11-16 NOTE — Telephone Encounter (Signed)
 I have called and spoke to this patient in regards to results placed orders.NFN

## 2024-11-18 ENCOUNTER — Other Ambulatory Visit: Payer: Self-pay | Admitting: Family Medicine

## 2024-11-18 DIAGNOSIS — R21 Rash and other nonspecific skin eruption: Secondary | ICD-10-CM

## 2024-11-18 NOTE — Telephone Encounter (Signed)
 Results given to patient.NFN

## 2024-11-19 ENCOUNTER — Ambulatory Visit: Admitting: Podiatry

## 2024-11-19 DIAGNOSIS — L900 Lichen sclerosus et atrophicus: Secondary | ICD-10-CM | POA: Diagnosis not present

## 2024-11-19 DIAGNOSIS — B354 Tinea corporis: Secondary | ICD-10-CM | POA: Diagnosis not present

## 2024-11-24 ENCOUNTER — Ambulatory Visit: Admitting: Podiatry

## 2024-11-26 ENCOUNTER — Other Ambulatory Visit: Payer: Self-pay | Admitting: Obstetrics and Gynecology

## 2024-11-26 DIAGNOSIS — Z124 Encounter for screening for malignant neoplasm of cervix: Secondary | ICD-10-CM | POA: Diagnosis not present

## 2024-11-26 DIAGNOSIS — Z1151 Encounter for screening for human papillomavirus (HPV): Secondary | ICD-10-CM | POA: Diagnosis not present

## 2024-11-26 DIAGNOSIS — Z720 Tobacco use: Secondary | ICD-10-CM

## 2024-11-26 DIAGNOSIS — Z0000 Encounter for general adult medical examination without abnormal findings: Secondary | ICD-10-CM | POA: Diagnosis not present

## 2024-11-26 DIAGNOSIS — Z6831 Body mass index (BMI) 31.0-31.9, adult: Secondary | ICD-10-CM | POA: Diagnosis not present

## 2024-11-27 ENCOUNTER — Telehealth: Payer: Self-pay

## 2024-11-27 NOTE — Telephone Encounter (Signed)
 Copied from CRM #[id number]. Topic: Clinical - Order For Equipment >> Nov 25, 2024  3:13 PM Devaughn RAMAN wrote: Reason for CRM: Pt is calling in regards to CPAP machine order, she stated she has not heard from anyone.   ATC [facility] to get an update for pt regarding CPAP order. Unable to reach anyone at [facility].  Called and spoke to pt, advised that her CPAP order was sent to [facility]. I provided pt with the number for [facility] so that she would be able to reach out to them.  Pt verbalized understanding, NFN.

## 2024-12-01 NOTE — Telephone Encounter (Signed)
 [facility] can someone check to see why patient needs appt before cpap will be covered? It appear she had new sleep study and order was place for new machine. Why is [employer] denying?   Copied from CRM #[id number]. Topic: Clinical - Order For Equipment >> Nov 27, 2024  2:08 PM Devaughn RAMAN wrote: Reason for CRM: Pt stated she was advised she needed to contact [employer] and [employer] stated they couldn't process the CPAP order because her insurance wouldn't pay for it because she has not been for a f/u visit since her sleep study and she was advised there was no availability until march. Please f/u with pt regarding this. Pt would like to get this appt this year while she still has Medicaid and she will have different insurance on next year.

## 2024-12-03 ENCOUNTER — Telehealth: Admitting: Physician Assistant

## 2024-12-03 DIAGNOSIS — J019 Acute sinusitis, unspecified: Secondary | ICD-10-CM | POA: Diagnosis not present

## 2024-12-03 DIAGNOSIS — B9689 Other specified bacterial agents as the cause of diseases classified elsewhere: Secondary | ICD-10-CM

## 2024-12-03 MED ORDER — AMOXICILLIN-POT CLAVULANATE 875-125 MG PO TABS: 1.0000 | ORAL_TABLET | Freq: Two times a day (BID) | ORAL | 0 refills | Status: AC

## 2024-12-03 NOTE — Patient Instructions (Signed)
 [name], thank you for joining Keshun Berrett, PA-C for today's virtual visit.  While this provider is not your primary care provider (PCP), if your PCP is located in our provider database this encounter information will be shared with them immediately following your visit.   A [facility] MyChart account gives you access to today's visit and all your visits, tests, and labs performed at [facility]  click here if you don't have a [facility] MyChart account or go to [email or web address]  Consent: (Patient) Robin Small provided verbal consent for this virtual visit at the beginning of the encounter.  Current Medications:  Current Outpatient Medications:    acetaminophen  (TYLENOL ) 500 MG tablet, Take 1,000 mg by mouth every 6 (six) hours as needed for moderate pain., Disp: , Rfl:    albuterol  (VENTOLIN  HFA) 108 (90 Base) MCG/ACT inhaler, Inhale 1-2 puffs into the lungs every 6 (six) hours as needed., Disp: 8 g, Rfl: 0   atorvastatin  (LIPITOR) 20 MG tablet, TAKE 1 TABLET BY MOUTH EVERY DAY, Disp: 90 tablet, Rfl: 3   Azelastine  HCl 137 MCG/SPRAY SOLN, SPRAY ONCE IN EACH NOSTRIL TWICE DAILY AS DIRECTED, Disp: 30 mL, Rfl: 11   brompheniramine-pseudoephedrine -DM 30-2-10 MG/5ML syrup, Take 5 mLs by mouth 4 (four) times daily as needed., Disp: 120 mL, Rfl: 0   Calcium  Carbonate-Vitamin D (CALCIUM -D) 600-400 MG-UNIT TABS, Take 2 tablets by mouth at bedtime., Disp: , Rfl:    chlorhexidine  (HIBICLENS ) 4 % external liquid, Twice a day, rinse affected area water , THEN apply minimum amount of 4% solution necessary to cover skin or wound area and wash gently, THEN rinse water , Disp: 473 mL, Rfl: 0   Cholecalciferol 1.25 MG (50000 UT) capsule, Take 1 capsule every week by oral route., Disp: , Rfl:    ciclopirox  (PENLAC ) 8 % solution, Apply topically at bedtime. Apply over nail and surrounding skin. Apply daily over previous coat. After seven (7) days, may remove with alcohol and  continue cycle., Disp: 6.6 mL, Rfl: 12   clobetasol cream (TEMOVATE) 0.05 %, Apply 1 application topically 2 (two) times daily as needed (skin irritation)., Disp: , Rfl:    diclofenac  Sodium (VOLTAREN ) 1 % GEL, APPLY 4 GRAMS TOPICALLY TO THE AFFECTED AREA FOUR TIMES DAILY AS NEEDED FOR PAIN, Disp: 300 g, Rfl: 0   fexofenadine  (ALLEGRA  ALLERGY) 180 MG tablet, Take 1 tablet (180 mg total) by mouth daily., Disp: 90 tablet, Rfl: 0   FLUCELVAX 0.5 ML injection, , Disp: , Rfl:    fluticasone  (FLONASE ) 50 MCG/ACT nasal spray, SPRAY 2 SPRAYS INTO EACH NOSTRIL EVERY DAY, Disp: 48 mL, Rfl: 1   hydrOXYzine  (VISTARIL ) 25 MG capsule, TAKE 1-2 CAPSULES (25-50 MG TOTAL) BY MOUTH EVERY 8 (EIGHT) HOURS AS NEEDED FOR ITCHING., Disp: 180 capsule, Rfl: 1   ibuprofen (ADVIL) 600 MG tablet, Take 1 tablet (600 mg total) by mouth every 8 (eight) hours as needed for moderate pain (pain score 4-6). (Patient not taking: Reported on 10/27/2024), Disp: 60 tablet, Rfl: 0   ketoconazole  (NIZORAL ) 2 % cream, Apply 1 Application topically 2 (two) times daily., Disp: 30 g, Rfl: 0   meloxicam  (MOBIC ) 15 MG tablet, TAKE 1 TABLET (15 MG TOTAL) BY MOUTH DAILY., Disp: 30 tablet, Rfl: 0   metoCLOPramide  (REGLAN ) 5 MG tablet, Take 1 tablet (5 mg total) by mouth every 8 (eight) hours as needed for nausea., Disp: 30 tablet, Rfl: 0   montelukast  (SINGULAIR ) 10 MG tablet, TAKE 1 TABLET BY MOUTH EVERYDAY AT  BEDTIME, Disp: 90 tablet, Rfl: 2   mupirocin  ointment (BACTROBAN ) 2 %, Apply 1 Application topically 2 (two) times daily., Disp: 22 g, Rfl: 1   nystatin  (MYCOSTATIN ) 100000 UNIT/ML suspension, Take 5 mLs (500,000 Units total) by mouth 4 (four) times daily. Swish and swallow, Disp: 200 mL, Rfl: 0   ondansetron  (ZOFRAN -ODT) 4 MG disintegrating tablet, TAKE 1-2 TABLETS (4-8 MG TOTAL) BY MOUTH EVERY 8 (EIGHT) HOURS AS NEEDED., Disp: 20 tablet, Rfl: 0   Semaglutide , 2 MG/DOSE, (OZEMPIC , 2 MG/DOSE,) 8 MG/3ML SOPN, Inject 2 mg into the skin once a  week. Additional refill from pcp, Disp: 3 mL, Rfl: 0   SPIKEVAX syringe, , Disp: , Rfl:    tiZANidine  (ZANAFLEX ) 4 MG capsule, tizanidine  4 mg capsule, Disp: , Rfl:    triamcinolone  (KENALOG ) 0.1 % paste, Use as directed 1 Application in the mouth or throat 2 (two) times daily., Disp: 5 g, Rfl: 12   triamcinolone  ointment (KENALOG ) 0.5 %, Apply 1 Application topically 2 (two) times daily. For moderate to severe eczema.  Do not use for more than 1 week at a time., Disp: 60 g, Rfl: 3   varenicline  (CHANTIX  CONTINUING MONTH PAK) 1 MG tablet, Take 1 tablet (1 mg total) by mouth 2 (two) times daily., Disp: 60 tablet, Rfl: 5   zolpidem  (AMBIEN ) 10 MG tablet, Take 1 tablet (10 mg total) by mouth at bedtime as needed. for sleep, Disp: 90 tablet, Rfl: 1   Medications ordered in this encounter:  No orders of the defined types were placed in this encounter.    *If you need refills on other medications prior to your next appointment, please contact your pharmacy*  Follow-Up: Call back or seek an in-person evaluation if the symptoms worsen or if the condition fails to improve as anticipated.  [facility] [phone]  Other Instructions    If you have been instructed to have an in-person evaluation today at a local Urgent Care facility, please use the link below. It will take you to a list of all of our available [facility], including address, phone number and hours of operation. Please do not delay care.  [facility]  If you or a family member do not have a primary care provider, use the link below to schedule a visit and establish care. When you choose a [facility] primary care physician or advanced practice provider, you gain a long-term partner in health. Find a Primary Care Provider  Learn more about [facility]'s in-office and virtual care options: [facility] - Get Care Now

## 2024-12-03 NOTE — Progress Notes (Signed)
 Virtual Visit Consent   Robin Small, you are scheduled for a virtual visit with a [facility] provider today. Just as with appointments in the office, your consent must be obtained to participate. Your consent will be active for this visit and any virtual visit you may have with one of our providers in the next 365 days. If you have a MyChart account, a copy of this consent can be sent to you electronically.  As this is a virtual visit, video technology does not allow for your provider to perform a traditional examination. This may limit your provider's ability to fully assess your condition. If your provider identifies any concerns that need to be evaluated in person or the need to arrange testing (such as labs, EKG, etc.), we will make arrangements to do so. Although advances in technology are sophisticated, we cannot ensure that it will always work on either your end or our end. If the connection with a video visit is poor, the visit may have to be switched to a telephone visit. With either a video or telephone visit, we are not always able to ensure that we have a secure connection.  By engaging in this virtual visit, you consent to the provision of healthcare and authorize for your insurance to be billed (if applicable) for the services provided during this visit. Depending on your insurance coverage, you may receive a charge related to this service.  I need to obtain your verbal consent now. Are you willing to proceed with your visit today? Robin Small has provided verbal consent on 12/03/2024 for a virtual visit (video or telephone). Shatima Zalar, PA-C  Date: 12/03/2024 10:39 AM   Virtual Visit via Video Note   I, Wyatt Thorstenson, connected with  Robin Small  ([id number], 1961/07/07) on 12/03/2024 at 10:30 AM EST by a video-enabled telemedicine application and verified that I am speaking with the correct person using two identifiers.  Location: Patient: Virtual Visit Location Patient:  Other: CAR Provider: Virtual Visit Location Provider: Home Office   I discussed the limitations of evaluation and management by telemedicine and the availability of in person appointments. The patient expressed understanding and agreed to proceed.    History of Present Illness: Robin Small is a 63 y.o. who identifies as a female who was assigned female at birth, and is being seen today for Sinus congestion.  Patient presents for evaluation of sinus congestion going on for over a week now.  Reports she initially started with a head cold that has since then progressed to sinus pain and pressure.  She has thick colored mucus.  Patient has been taking her allergy medicine and using Sudafed with some mild relief.  She denies any sick contacts.  Denies any fever, chills, chest pain, shortness of breath.  HPI:    Problems:  Patient Active Problem List   Diagnosis Date Noted   Coronary artery calcification seen on CAT scan 06/01/2024   Panlobular emphysema (HCC) 05/08/2024   Folliculitis of axilla 05/08/2024   Multinodular goiter 04/28/2024   Left foot pain 02/04/2024   Thyroid  nodule 02/04/2024   Aortic atherosclerosis 02/04/2024   Chronic midline low back pain with left-sided sciatica 09/27/2023   Extrinsic asthma 09/27/2023   Type 2 diabetes mellitus with neurological complications (HCC) 09/27/2023   Type 2 diabetes mellitus without complication, without long-term current use of insulin (HCC) 05/27/2023   Chronic pain of left ankle 05/27/2023   Hyperlipidemia associated with type 2 diabetes mellitus (HCC)  03/01/2023   Chronic pain of both knees 12/29/2022   Tobacco abuse 12/29/2022   Hemorrhoids, internal 08/31/2022   Lichen sclerosus 05/24/2022   TMJ (temporomandibular joint syndrome) 05/24/2022   Seasonal allergic rhinitis due to pollen 05/24/2022   Class 1 obesity with serious comorbidity and body mass index (BMI) of 33.0 to 33.9 in adult 05/24/2022   Sleep disturbance 05/24/2022    Primary osteoarthritis of right knee 09/15/2019   Primary osteoarthritis of left knee 03/03/2019   Seasonal and perennial allergic rhinitis 02/13/2019   S/P hemorrhoidectomy 02/01/2017   Post-operative nausea and vomiting 01/24/2017   Sinusitis, acute 08/24/2013   Dyspnea 01/08/2013   OSA (obstructive sleep apnea) 01/08/2013   Muscle ache 02/08/2012   History of colonic polyps 04/06/2011   External hemorrhoids 12/27/2010   Mild intermittent asthma 06/03/2010   LUMBAR SPRAIN AND STRAIN 07/20/2008   ANXIETY DEPRESSION 06/28/2008   Dyslipidemia 07/10/2007    Allergies: Allergies[1] Medications: Current Medications[2]  Observations/Objective: Patient is well-developed, well-nourished in no acute distress.  Resting comfortably  in car Head is normocephalic, atraumatic.  Sounds congested, no cough noted No labored breathing. Speech is clear and coherent with logical content.  Patient is alert and oriented at baseline.    Assessment and Plan: 1. Acute bacterial sinusitis (Primary) - amoxicillin -clavulanate (AUGMENTIN ) 875-125 MG tablet; Take 1 tablet by mouth 2 (two) times daily for 7 days.  Dispense: 14 tablet; Refill: 0   Given duration and symptoms at this time we will treat for possible acute bacterial sinusitis Patient counseled extensively at the use of antibiotics at this time per up-to-date guidelines for an acute uncomplicated sinusitis steroids are not are not indicated Supportive treatment recommended with Flonase , Nettie pot sinus rinses, Mucinex Follow-up with primary care in 5 to 7 days if no relief.  Follow Up Instructions: I discussed the assessment and treatment plan with the patient. The patient was provided an opportunity to ask questions and all were answered. The patient agreed with the plan and demonstrated an understanding of the instructions.  A copy of instructions were sent to the patient via MyChart unless otherwise noted below.   The patient was advised  to call back or seek an in-person evaluation if the symptoms worsen or if the condition fails to improve as anticipated.    Yuriy Cui, PA-C     [1]  Allergies Allergen Reactions   Chantix  [Varenicline ] Nausea Only   Codeine Nausea Only  [2]  Current Outpatient Medications:    amoxicillin -clavulanate (AUGMENTIN ) 875-125 MG tablet, Take 1 tablet by mouth 2 (two) times daily for 7 days., Disp: 14 tablet, Rfl: 0   acetaminophen  (TYLENOL ) 500 MG tablet, Take 1,000 mg by mouth every 6 (six) hours as needed for moderate pain., Disp: , Rfl:    albuterol  (VENTOLIN  HFA) 108 (90 Base) MCG/ACT inhaler, Inhale 1-2 puffs into the lungs every 6 (six) hours as needed., Disp: 8 g, Rfl: 0   atorvastatin  (LIPITOR) 20 MG tablet, TAKE 1 TABLET BY MOUTH EVERY DAY, Disp: 90 tablet, Rfl: 3   Azelastine  HCl 137 MCG/SPRAY SOLN, SPRAY ONCE IN EACH NOSTRIL TWICE DAILY AS DIRECTED, Disp: 30 mL, Rfl: 11   brompheniramine-pseudoephedrine -DM 30-2-10 MG/5ML syrup, Take 5 mLs by mouth 4 (four) times daily as needed., Disp: 120 mL, Rfl: 0   Calcium  Carbonate-Vitamin D (CALCIUM -D) 600-400 MG-UNIT TABS, Take 2 tablets by mouth at bedtime., Disp: , Rfl:    chlorhexidine  (HIBICLENS ) 4 % external liquid, Twice a day, rinse affected area water , THEN  apply minimum amount of 4% solution necessary to cover skin or wound area and wash gently, THEN rinse water , Disp: 473 mL, Rfl: 0   Cholecalciferol 1.25 MG (50000 UT) capsule, Take 1 capsule every week by oral route., Disp: , Rfl:    ciclopirox  (PENLAC ) 8 % solution, Apply topically at bedtime. Apply over nail and surrounding skin. Apply daily over previous coat. After seven (7) days, may remove with alcohol and continue cycle., Disp: 6.6 mL, Rfl: 12   clobetasol cream (TEMOVATE) 0.05 %, Apply 1 application topically 2 (two) times daily as needed (skin irritation)., Disp: , Rfl:    diclofenac  Sodium (VOLTAREN ) 1 % GEL, APPLY 4 GRAMS TOPICALLY TO THE AFFECTED AREA FOUR TIMES DAILY AS  NEEDED FOR PAIN, Disp: 300 g, Rfl: 0   fexofenadine  (ALLEGRA  ALLERGY) 180 MG tablet, Take 1 tablet (180 mg total) by mouth daily., Disp: 90 tablet, Rfl: 0   FLUCELVAX 0.5 ML injection, , Disp: , Rfl:    fluticasone  (FLONASE ) 50 MCG/ACT nasal spray, SPRAY 2 SPRAYS INTO EACH NOSTRIL EVERY DAY, Disp: 48 mL, Rfl: 1   hydrOXYzine  (VISTARIL ) 25 MG capsule, TAKE 1-2 CAPSULES (25-50 MG TOTAL) BY MOUTH EVERY 8 (EIGHT) HOURS AS NEEDED FOR ITCHING., Disp: 180 capsule, Rfl: 1   ibuprofen (ADVIL) 600 MG tablet, Take 1 tablet (600 mg total) by mouth every 8 (eight) hours as needed for moderate pain (pain score 4-6). (Patient not taking: Reported on 10/27/2024), Disp: 60 tablet, Rfl: 0   ketoconazole  (NIZORAL ) 2 % cream, Apply 1 Application topically 2 (two) times daily., Disp: 30 g, Rfl: 0   meloxicam  (MOBIC ) 15 MG tablet, TAKE 1 TABLET (15 MG TOTAL) BY MOUTH DAILY., Disp: 30 tablet, Rfl: 0   metoCLOPramide  (REGLAN ) 5 MG tablet, Take 1 tablet (5 mg total) by mouth every 8 (eight) hours as needed for nausea., Disp: 30 tablet, Rfl: 0   montelukast  (SINGULAIR ) 10 MG tablet, TAKE 1 TABLET BY MOUTH EVERYDAY AT BEDTIME, Disp: 90 tablet, Rfl: 2   mupirocin  ointment (BACTROBAN ) 2 %, Apply 1 Application topically 2 (two) times daily., Disp: 22 g, Rfl: 1   nystatin  (MYCOSTATIN ) 100000 UNIT/ML suspension, Take 5 mLs (500,000 Units total) by mouth 4 (four) times daily. Swish and swallow, Disp: 200 mL, Rfl: 0   ondansetron  (ZOFRAN -ODT) 4 MG disintegrating tablet, TAKE 1-2 TABLETS (4-8 MG TOTAL) BY MOUTH EVERY 8 (EIGHT) HOURS AS NEEDED., Disp: 20 tablet, Rfl: 0   Semaglutide , 2 MG/DOSE, (OZEMPIC , 2 MG/DOSE,) 8 MG/3ML SOPN, Inject 2 mg into the skin once a week. Additional refill from pcp, Disp: 3 mL, Rfl: 0   SPIKEVAX syringe, , Disp: , Rfl:    tiZANidine  (ZANAFLEX ) 4 MG capsule, tizanidine  4 mg capsule, Disp: , Rfl:    triamcinolone  (KENALOG ) 0.1 % paste, Use as directed 1 Application in the mouth or throat 2 (two) times  daily., Disp: 5 g, Rfl: 12   triamcinolone  ointment (KENALOG ) 0.5 %, Apply 1 Application topically 2 (two) times daily. For moderate to severe eczema.  Do not use for more than 1 week at a time., Disp: 60 g, Rfl: 3   varenicline  (CHANTIX  CONTINUING MONTH PAK) 1 MG tablet, Take 1 tablet (1 mg total) by mouth 2 (two) times daily., Disp: 60 tablet, Rfl: 5   zolpidem  (AMBIEN ) 10 MG tablet, Take 1 tablet (10 mg total) by mouth at bedtime as needed. for sleep, Disp: 90 tablet, Rfl: 1

## 2024-12-04 ENCOUNTER — Other Ambulatory Visit: Payer: Self-pay | Admitting: Pulmonary Disease

## 2024-12-04 DIAGNOSIS — G479 Sleep disorder, unspecified: Secondary | ICD-10-CM

## 2024-12-05 ENCOUNTER — Other Ambulatory Visit: Payer: Self-pay | Admitting: Nurse Practitioner

## 2024-12-05 DIAGNOSIS — E6609 Other obesity due to excess calories: Secondary | ICD-10-CM

## 2024-12-05 DIAGNOSIS — E66811 Obesity, class 1: Secondary | ICD-10-CM

## 2024-12-05 DIAGNOSIS — E119 Type 2 diabetes mellitus without complications: Secondary | ICD-10-CM

## 2024-12-07 ENCOUNTER — Ambulatory Visit: Admitting: Pulmonary Disease

## 2024-12-07 ENCOUNTER — Other Ambulatory Visit: Payer: Self-pay | Admitting: Family Medicine

## 2024-12-07 ENCOUNTER — Other Ambulatory Visit: Payer: Self-pay | Admitting: Pulmonary Disease

## 2024-12-07 ENCOUNTER — Inpatient Hospital Stay: Admission: RE | Admit: 2024-12-07 | Source: Ambulatory Visit

## 2024-12-07 ENCOUNTER — Encounter

## 2024-12-07 DIAGNOSIS — G479 Sleep disorder, unspecified: Secondary | ICD-10-CM

## 2024-12-07 DIAGNOSIS — J301 Allergic rhinitis due to pollen: Secondary | ICD-10-CM

## 2024-12-07 NOTE — Telephone Encounter (Signed)
 Copied from CRM [id number]. Topic: Clinical - Medication Refill >> Dec 07, 2024  2:15 PM Devaughn S wrote: Medication: zolpidem  (AMBIEN ) 10 MG tablet  Has the patient contacted their pharmacy? Yes (Agent: If no, request that the patient contact the pharmacy for the refill. If patient does not wish to contact the pharmacy document the reason why and proceed with request.) (Agent: If yes, when and what did the pharmacy advise?)  This is the patient's preferred pharmacy:  [id number] [address], [place] - [address] [place] [address] Phone: [phone] Fax: [phone]  Is this the correct pharmacy for this prescription? Yes If no, delete pharmacy and type the correct one.   Has the prescription been filled recently? No  Is the patient out of the medication? Yes  Has the patient been seen for an appointment in the last year OR does the patient have an upcoming appointment? Yes  Can we respond through MyChart? Yes  Agent: Please be advised that Rx refills may take up to 3 business days. We ask that you follow-up with your pharmacy.

## 2024-12-08 ENCOUNTER — Encounter: Payer: Self-pay | Admitting: Pulmonary Disease

## 2024-12-08 NOTE — Telephone Encounter (Signed)
 Already addressed

## 2024-12-11 ENCOUNTER — Other Ambulatory Visit: Payer: Self-pay | Admitting: Family Medicine

## 2024-12-11 DIAGNOSIS — R21 Rash and other nonspecific skin eruption: Secondary | ICD-10-CM

## 2024-12-11 DIAGNOSIS — E119 Type 2 diabetes mellitus without complications: Secondary | ICD-10-CM

## 2024-12-21 ENCOUNTER — Encounter: Payer: Self-pay | Admitting: Podiatry

## 2024-12-21 ENCOUNTER — Ambulatory Visit: Admitting: Podiatry

## 2024-12-21 DIAGNOSIS — M19072 Primary osteoarthritis, left ankle and foot: Secondary | ICD-10-CM

## 2024-12-21 DIAGNOSIS — M216X2 Other acquired deformities of left foot: Secondary | ICD-10-CM

## 2024-12-21 MED ORDER — TIZANIDINE HCL 4 MG PO CAPS: 4.0000 mg | ORAL_CAPSULE | Freq: Three times a day (TID) | ORAL | 0 refills | Status: AC | PRN

## 2024-12-21 MED ORDER — MELOXICAM 15 MG PO TABS: 15.0000 mg | ORAL_TABLET | Freq: Every day | ORAL | 0 refills | Status: AC

## 2024-12-21 MED ORDER — TRIAMCINOLONE ACETONIDE 10 MG/ML IJ SUSP: 5.0000 mg | Freq: Once | INTRAMUSCULAR | Status: AC

## 2024-12-21 NOTE — Progress Notes (Signed)
 "  Subjective:  Patient ID: Robin Small, female    DOB: [birth year],  MRN: [id number]  Chief Complaint  Patient presents with   Foot Pain    L ankle lateral pain. Last injection helped her walk much better and would like injection today.  Diabetic A1c 5.8 08/21/24.  No anti coag    Discussed the use of AI scribe software for clinical note transcription with the patient, who gave verbal consent to proceed.  History of Present Illness Robin Small is a 64 year old female with left foot primary osteoarthritis and acquired flat foot who presents with recurrent left foot pain and instability.  She has recurrent left foot pain involving the subtalar joint and ankle, localized to the lateral and inferior foot with radiation from the posterior to the dorsal and lateral regions. Symptoms wax and wane over months and worsen with increased activity and prolonged standing.  During flares she notes locking of the foot, a feeling of arch collapse, and the foot turning inward when standing.  She continues to use prior foot orthoses but feels she may need replacement and is considering additional support options. She uses meloxicam  as needed with some benefit and intermittently uses a muscle relaxant for foot pain, but she is currently out of both medications.      Objective:    Physical Exam Dermatology: Skin is warm, dry and supple bilateral lower extremities. Interspaces are clear of maceration and debris.  Nailplates mycotic some improvement to nailplates with penlac    Vascular: Palpable pedal pulses bilaterally. Capillary refill within normal limits. No erythema or calor.   Neurological: Light touch sensation grossly intact bilateral feet.    Musculoskeletal Exam: Left foot greater than right foot pes planus foot type with increased pronation.  There is tenderness on palpation of left sinus tarsi and of subtalar joint.  Decrease subtalar joint inversion eversion versus right side.  No  significant pain with ankle joint range of motion.  Does ambulate with cane assistance.    No images are attached to the encounter.    Results     Assessment:   1. Arthritis of left subtalar joint   2. Acquired pronation deformity of foot, left      Plan:  Patient was evaluated and treated and all questions answered.  Assessment and Plan Assessment & Plan Arthritis of left subtalar joint Chronic arthritis with intermittent symptoms, exacerbated by activity. Previously responsive to meloxicam .  - Refilled meloxicam  prescription for use as needed during increased activity or pain. - Offered corticosteroid injection for symptomatic relief today. - Initiate 14 day course of tizanidine  for muscle spasms and musculoskeletal pain.  Pronation deformity with acquired flat foot, left foot Chronic flat foot deformity causing overpronation and increased strain on subtalar joint and tendons. Current inserts inadequate. - Recommended replacement with more rigid over-the-counter orthotic (Power Steps). - Provided education on orthotic options emphasizing stiff arch, cushioned heel, and deep heel cup. - Discussed custom orthotics as a long-term option, starting with over-the-counter due to cost and insurance. - Advised gradual adjustment to new orthotics, increasing wear time by 30-60 minutes daily.   Procedure: Injection intermediate joint Discussed alternatives, risks, complications and verbal consent was obtained.  Location: Left subtalar joint, sinus tarsi approach. Skin Prep: Betadine . Injectate: 0.5 cc of 0.5% Marcaine  plain mixed with 0.5 cc Kenalog , 0.5 cc dexamethasone  Disposition: Patient tolerated procedure well. Injection site dressed with a band-aid.  Post-injection care was discussed and return precautions discussed.  Return if symptoms worsen or fail to improve, for subtalar joint arthritis.       "

## 2024-12-30 ENCOUNTER — Ambulatory Visit: Admitting: Pulmonary Disease

## 2025-01-01 NOTE — Telephone Encounter (Signed)
 LVM to call back re: new cpap machine

## 2025-01-08 ENCOUNTER — Telehealth (HOSPITAL_BASED_OUTPATIENT_CLINIC_OR_DEPARTMENT_OTHER): Payer: Self-pay | Admitting: Pulmonary Disease

## 2025-01-08 ENCOUNTER — Other Ambulatory Visit (HOSPITAL_COMMUNITY): Payer: Self-pay

## 2025-01-08 NOTE — Telephone Encounter (Signed)
 According to [facility] from [place], this patients account was deactivated in March of 2024 due to her not ordering Cpap supplies. This order cannot be filled without notes pertaining to her needing the cpap machine. Tommye had mentioned a follow up appointment where you would be  going over sleep study documents and recommending her needing the Cpap. I hope this makes sense. I see she had a sleep study 10/30/2024. I don't know the process of notations post sleep study, so maybe there just needs to be notes added pertaining to her needing to use the cpap? The DME companies have this protocol if the patient has not kept up with using their machines. Thank you for your patience with this. Please let me know when notes are updated. I have left a message with the patient to call me back as well.

## 2025-01-11 ENCOUNTER — Other Ambulatory Visit (HOSPITAL_COMMUNITY): Payer: Self-pay

## 2025-01-12 ENCOUNTER — Other Ambulatory Visit (HOSPITAL_COMMUNITY): Payer: Self-pay

## 2025-01-18 ENCOUNTER — Telehealth: Payer: Self-pay

## 2025-01-18 ENCOUNTER — Other Ambulatory Visit (HOSPITAL_COMMUNITY): Payer: Self-pay

## 2025-01-19 ENCOUNTER — Telehealth: Payer: Self-pay | Admitting: Physician Assistant

## 2025-01-19 DIAGNOSIS — K047 Periapical abscess without sinus: Secondary | ICD-10-CM

## 2025-01-19 MED ORDER — CHLORHEXIDINE GLUCONATE 0.12 % MT SOLN: 15.0000 mL | Freq: Two times a day (BID) | OROMUCOSAL | 0 refills | Status: AC

## 2025-01-19 MED ORDER — AMOXICILLIN-POT CLAVULANATE 875-125 MG PO TABS: 1.0000 | ORAL_TABLET | Freq: Two times a day (BID) | ORAL | 0 refills | Status: AC

## 2025-01-22 ENCOUNTER — Other Ambulatory Visit (HOSPITAL_COMMUNITY): Payer: Self-pay

## 2025-01-22 NOTE — Telephone Encounter (Signed)
 Pharmacy Patient Advocate Encounter  Received notification from [facility] that Prior Authorization for Ozempic  (0.25 or 0.5 MG/DOSE) 2MG /3ML pen-injectors has been APPROVED  PA Case: [id number], Status: Approved, Coverage Starts on: 01/21/2025 12:00:00 AM, Coverage Ends on: 01/21/2026 12:00:00 AM. Authorization Expiration02/05/202

## 2025-01-22 NOTE — Telephone Encounter (Signed)
 [facility] and patient is flipping between Robin Small  4mg  and 8mg . On December 23rd she was taking Robin Small  8mg  and then now she is requesting Robin Small  4mg .   Can you please verify the strength that patient needs to take.

## 2025-01-22 NOTE — Telephone Encounter (Signed)
 Yes can we get an updated RX in Epic for the 1 mg? Active RX on file is for the 2 mg dose.

## 2025-01-22 NOTE — Telephone Encounter (Signed)
 I called and spoke with pharmacy and prior auth needs to be for the 1mg  of Ozempic ? Can we get another prior auth?

## 2025-03-02 ENCOUNTER — Ambulatory Visit: Admitting: Pulmonary Disease

## 2025-03-02 ENCOUNTER — Encounter

## 2025-04-06 ENCOUNTER — Ambulatory Visit: Admitting: Pulmonary Disease
